# Patient Record
Sex: Male | Born: 1955
Health system: Southern US, Community
[De-identification: ages and names within clinical notes are randomized; demographics above are authoritative.]

## PROBLEM LIST (undated history)

## (undated) DIAGNOSIS — G8929 Other chronic pain: Secondary | ICD-10-CM

## (undated) DIAGNOSIS — N319 Neuromuscular dysfunction of bladder, unspecified: Secondary | ICD-10-CM

## (undated) DIAGNOSIS — G114 Hereditary spastic paraplegia: Secondary | ICD-10-CM

## (undated) DIAGNOSIS — M545 Low back pain, unspecified: Secondary | ICD-10-CM

## (undated) DIAGNOSIS — M1990 Unspecified osteoarthritis, unspecified site: Secondary | ICD-10-CM

## (undated) DIAGNOSIS — F5104 Psychophysiologic insomnia: Secondary | ICD-10-CM

## (undated) DIAGNOSIS — G2581 Restless legs syndrome: Secondary | ICD-10-CM

## (undated) DIAGNOSIS — R269 Unspecified abnormalities of gait and mobility: Secondary | ICD-10-CM

## (undated) DIAGNOSIS — K648 Other hemorrhoids: Secondary | ICD-10-CM

## (undated) DIAGNOSIS — E785 Hyperlipidemia, unspecified: Secondary | ICD-10-CM

## (undated) DIAGNOSIS — G5601 Carpal tunnel syndrome, right upper limb: Secondary | ICD-10-CM

## (undated) DIAGNOSIS — F32A Depression, unspecified: Secondary | ICD-10-CM

## (undated) DIAGNOSIS — F329 Major depressive disorder, single episode, unspecified: Secondary | ICD-10-CM

## (undated) HISTORY — PX: APPENDECTOMY: SHX54

## (undated) HISTORY — DX: Depression, unspecified: F32.A

## (undated) HISTORY — DX: Other chronic pain: G89.29

## (undated) HISTORY — DX: Hereditary spastic paraplegia: G11.4

## (undated) HISTORY — PX: SHOULDER SURGERY: SHX246

## (undated) HISTORY — DX: Carpal tunnel syndrome, right upper limb: G56.01

## (undated) HISTORY — DX: Major depressive disorder, single episode, unspecified: F32.9

## (undated) HISTORY — DX: Restless legs syndrome: G25.81

## (undated) HISTORY — DX: Low back pain, unspecified: M54.50

## (undated) HISTORY — DX: Other hemorrhoids: K64.8

## (undated) HISTORY — DX: Low back pain: M54.5

## (undated) HISTORY — DX: Hyperlipidemia, unspecified: E78.5

## (undated) HISTORY — DX: Unspecified osteoarthritis, unspecified site: M19.90

## (undated) HISTORY — PX: BREAST SURGERY: SHX581

## (undated) HISTORY — DX: Unspecified abnormalities of gait and mobility: R26.9

## (undated) HISTORY — DX: Psychophysiologic insomnia: F51.04

## (undated) HISTORY — PX: KNEE ARTHROSCOPY: SHX127

## (undated) HISTORY — DX: Neuromuscular dysfunction of bladder, unspecified: N31.9

## (undated) HISTORY — PX: COLONOSCOPY: SHX174

## (undated) HISTORY — PX: HEMORRHOID BANDING: SHX5850

---

## 2003-10-17 ENCOUNTER — Ambulatory Visit (HOSPITAL_COMMUNITY): Admission: RE | Admit: 2003-10-17 | Discharge: 2003-10-17 | Payer: Self-pay | Admitting: Family Medicine

## 2008-01-01 ENCOUNTER — Encounter: Admission: RE | Admit: 2008-01-01 | Discharge: 2008-01-27 | Payer: Self-pay | Admitting: Neurology

## 2012-07-09 DIAGNOSIS — M545 Low back pain, unspecified: Secondary | ICD-10-CM | POA: Insufficient documentation

## 2012-07-09 DIAGNOSIS — R269 Unspecified abnormalities of gait and mobility: Secondary | ICD-10-CM | POA: Insufficient documentation

## 2012-07-09 DIAGNOSIS — G8389 Other specified paralytic syndromes: Secondary | ICD-10-CM | POA: Insufficient documentation

## 2012-07-09 DIAGNOSIS — N398 Other specified disorders of urinary system: Secondary | ICD-10-CM | POA: Insufficient documentation

## 2012-07-09 DIAGNOSIS — R209 Unspecified disturbances of skin sensation: Secondary | ICD-10-CM | POA: Insufficient documentation

## 2013-01-06 ENCOUNTER — Encounter: Payer: Self-pay | Admitting: Neurology

## 2013-01-06 DIAGNOSIS — G8389 Other specified paralytic syndromes: Secondary | ICD-10-CM

## 2013-01-06 DIAGNOSIS — R209 Unspecified disturbances of skin sensation: Secondary | ICD-10-CM

## 2013-01-06 DIAGNOSIS — N398 Other specified disorders of urinary system: Secondary | ICD-10-CM

## 2013-01-06 DIAGNOSIS — R269 Unspecified abnormalities of gait and mobility: Secondary | ICD-10-CM

## 2013-01-06 DIAGNOSIS — M545 Low back pain, unspecified: Secondary | ICD-10-CM

## 2013-01-07 ENCOUNTER — Ambulatory Visit: Payer: Self-pay | Admitting: Neurology

## 2013-08-11 DIAGNOSIS — G8929 Other chronic pain: Secondary | ICD-10-CM | POA: Insufficient documentation

## 2013-08-18 DIAGNOSIS — Z79899 Other long term (current) drug therapy: Secondary | ICD-10-CM | POA: Insufficient documentation

## 2013-10-27 ENCOUNTER — Telehealth: Payer: Self-pay | Admitting: Neurology

## 2013-10-27 ENCOUNTER — Encounter: Payer: Self-pay | Admitting: *Deleted

## 2013-10-27 NOTE — Telephone Encounter (Signed)
Pt having some issues wants to be seen

## 2013-10-27 NOTE — Telephone Encounter (Signed)
Called patient and he is having gait problems, scheduled /confirmed appt for f/u.

## 2013-10-28 ENCOUNTER — Ambulatory Visit (INDEPENDENT_AMBULATORY_CARE_PROVIDER_SITE_OTHER): Payer: Commercial Managed Care - PPO | Admitting: Neurology

## 2013-10-28 ENCOUNTER — Encounter: Payer: Self-pay | Admitting: Neurology

## 2013-10-28 ENCOUNTER — Encounter (INDEPENDENT_AMBULATORY_CARE_PROVIDER_SITE_OTHER): Payer: Self-pay

## 2013-10-28 VITALS — BP 132/86 | HR 91 | Ht 68.0 in | Wt 162.0 lb

## 2013-10-28 DIAGNOSIS — M545 Low back pain, unspecified: Secondary | ICD-10-CM

## 2013-10-28 DIAGNOSIS — R269 Unspecified abnormalities of gait and mobility: Secondary | ICD-10-CM

## 2013-10-28 DIAGNOSIS — G114 Hereditary spastic paraplegia: Secondary | ICD-10-CM | POA: Insufficient documentation

## 2013-10-28 MED ORDER — BACLOFEN 10 MG PO TABS: ORAL_TABLET | ORAL | Status: DC

## 2013-10-28 NOTE — Progress Notes (Signed)
Reason for visit: Gait disorder  Terry PandySteve Howard is an 58 y.o. male  History of present illness:  Terry Howard is a 58 year old right-handed white male with a history of a hereditary spastic paraparesis. The patient's mother and 3 brothers had the same issue. The patient has developed increasing spasticity of both legs, left greater than right. The patient does have a neurogenic bladder, but he indicates that this has not changed over time. The patient denies any falls, and he walks with a quad cane. The patient has increasing problems with his restless leg syndrome indicating that when he tries to sleep at night or rest during the day, his legs will flex suddenly. This may affect either the right or left leg. The patient is taking Requip at this point, 3 mg at night without much benefit. The patient went off of his lorazepam, but he is not clear that his restless leg syndrome worsened because of this. The patient returns to office today for an evaluation. The patient is catching his toes with walking, and he has an AFO brace, but does not wear this as he feels it does not help.  Past Medical History  Diagnosis Date  . Hereditary spastic paraparesis   . RLS (restless legs syndrome)   . Chronic low back pain     Annular tear at the L2-3 and L4-5 levels 4  . Neurogenic bladder   . 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 Chronic insomnia   . Gait disorder     Past Surgical History  Procedure Laterality Date  . Shoulder surgery      Rotator cuff, left  . Breast surgery Bilateral     Family History  Problem Relation Age of Onset  . Cancer Mother     breast cancer  . Stroke Father     Intracranial hemorrhage    Social history:  reports that he has never smoked. He has never used smokeless tobacco. He reports that he drinks alcohol. He reports that he does not use illicit drugs.   No Known Allergies  Medications:  Current Outpatient Prescriptions on File Prior to Visit    Medication Sig Dispense Refill  . tizanidine (ZANAFLEX) 2 MG capsule Take 2 mg by mouth 3 (three) times daily.      . traZODone (DESYREL) 150 MG tablet Take 150 mg by mouth at bedtime.       No current facility-administered medications on file prior to visit.    ROS:  Out of a complete 14 system review of symptoms, the patient complains only of the following symptoms, and all other reviewed systems are negative.  Abdominal pain Restless legs, insomnia Low back pain Walking difficulties  Blood pressure 132/86, pulse 91, height 5\' 8"  (1.727 m), weight 162 lb (73.483 kg).  Physical Exam  General: The patient is alert and cooperative at the time of the examination.  Skin: No significant peripheral edema is noted.   Neurologic Exam  Mental status: The patient is oriented x 3.  Cranial nerves: Facial symmetry is present. Speech is normal, no aphasia or dysarthria is noted. Extraocular movements are full. Visual fields are full.  Motor: The patient has good strength in all 4 extremities.  Sensory examination: Soft sensation is symmetric on the face, arms, or legs.  Coordination: The patient has good finger-nose-finger and heel-to-shin bilaterally.  Gait and station: The patient has a diplegia gait. Tandem gait was not attempted. The patient walks with a quad cane.  Romberg is negative. No drift is seen.  Reflexes: Deep tendon reflexes are symmetric, but are brisk in the legs.   Assessment/Plan:  1. Hereditary spastic paraparesis  2. Gait disorder  3. Neurogenic bladder  4. Restless leg syndrome  5. Chronic insomnia  The patient is having increasing problems with sleeping at night and resting during the day with the restless leg syndrome or possibly spasticity of the legs. The patient will be given a trial on baclofen taking 5 mg twice a day, 10 mg at night. In the future, the patient may be considered for a trial on Belsomra for sleep, as he has chronic insomnia. The  patient will followup in one year, but the patient will call me if medication adjustments need to be made.  Terry Palau MD 10/28/2013 7:48 PM  [facility] [address] [place], [place] [address]  Phone [phone] Fax [phone]

## 2013-10-28 NOTE — Patient Instructions (Signed)

## 2013-10-29 DIAGNOSIS — R935 Abnormal findings on diagnostic imaging of other abdominal regions, including retroperitoneum: Secondary | ICD-10-CM | POA: Insufficient documentation

## 2013-11-18 DIAGNOSIS — G4700 Insomnia, unspecified: Secondary | ICD-10-CM | POA: Insufficient documentation

## 2013-11-18 DIAGNOSIS — B009 Herpesviral infection, unspecified: Secondary | ICD-10-CM | POA: Insufficient documentation

## 2013-11-18 DIAGNOSIS — M79609 Pain in unspecified limb: Secondary | ICD-10-CM | POA: Insufficient documentation

## 2013-12-18 DIAGNOSIS — E785 Hyperlipidemia, unspecified: Secondary | ICD-10-CM | POA: Insufficient documentation

## 2013-12-31 DIAGNOSIS — F329 Major depressive disorder, single episode, unspecified: Secondary | ICD-10-CM | POA: Insufficient documentation

## 2013-12-31 DIAGNOSIS — F32A Depression, unspecified: Secondary | ICD-10-CM | POA: Insufficient documentation

## 2014-01-05 DIAGNOSIS — F419 Anxiety disorder, unspecified: Secondary | ICD-10-CM | POA: Insufficient documentation

## 2014-06-21 ENCOUNTER — Encounter: Payer: Self-pay | Admitting: Neurology

## 2014-09-08 ENCOUNTER — Other Ambulatory Visit (HOSPITAL_BASED_OUTPATIENT_CLINIC_OR_DEPARTMENT_OTHER): Payer: Self-pay | Admitting: Orthopaedic Surgery

## 2014-09-09 ENCOUNTER — Encounter (HOSPITAL_BASED_OUTPATIENT_CLINIC_OR_DEPARTMENT_OTHER): Payer: Self-pay | Admitting: *Deleted

## 2014-09-10 ENCOUNTER — Ambulatory Visit (HOSPITAL_BASED_OUTPATIENT_CLINIC_OR_DEPARTMENT_OTHER)
Admission: RE | Admit: 2014-09-10 | Discharge: 2014-09-10 | Disposition: A | Discharge status: Home / Self Care | Payer: 59 | Source: Ambulatory Visit | Attending: Orthopaedic Surgery | Admitting: Orthopaedic Surgery

## 2014-09-10 ENCOUNTER — Ambulatory Visit (HOSPITAL_BASED_OUTPATIENT_CLINIC_OR_DEPARTMENT_OTHER): Payer: 59 | Admitting: Anesthesiology

## 2014-09-10 ENCOUNTER — Encounter (HOSPITAL_BASED_OUTPATIENT_CLINIC_OR_DEPARTMENT_OTHER): Payer: Self-pay | Admitting: Certified Registered"

## 2014-09-10 ENCOUNTER — Encounter (HOSPITAL_BASED_OUTPATIENT_CLINIC_OR_DEPARTMENT_OTHER)
Admission: RE | Disposition: A | Discharge status: Home / Self Care | Payer: Self-pay | Source: Ambulatory Visit | Attending: Orthopaedic Surgery

## 2014-09-10 DIAGNOSIS — G2581 Restless legs syndrome: Secondary | ICD-10-CM | POA: Insufficient documentation

## 2014-09-10 DIAGNOSIS — G5622 Lesion of ulnar nerve, left upper limb: Secondary | ICD-10-CM | POA: Insufficient documentation

## 2014-09-10 DIAGNOSIS — M549 Dorsalgia, unspecified: Secondary | ICD-10-CM | POA: Diagnosis not present

## 2014-09-10 DIAGNOSIS — F5104 Psychophysiologic insomnia: Secondary | ICD-10-CM | POA: Diagnosis not present

## 2014-09-10 DIAGNOSIS — G8929 Other chronic pain: Secondary | ICD-10-CM | POA: Diagnosis not present

## 2014-09-10 DIAGNOSIS — Z79899 Other long term (current) drug therapy: Secondary | ICD-10-CM | POA: Insufficient documentation

## 2014-09-10 HISTORY — PX: ULNAR TUNNEL RELEASE: SHX820

## 2014-09-10 LAB — POCT HEMOGLOBIN-HEMACUE: Hemoglobin: 15.9 g/dL (ref 13.0–17.0)

## 2014-09-10 SURGERY — RELEASE, CUBITAL TUNNEL
Anesthesia: General | Site: Arm Upper | Laterality: Left

## 2014-09-10 MED ORDER — HYDROCODONE-ACETAMINOPHEN 5-325 MG PO TABS: 1.0000 | ORAL_TABLET | Freq: Four times a day (QID) | ORAL | Status: DC | PRN

## 2014-09-10 MED ORDER — CEFAZOLIN SODIUM-DEXTROSE 2-3 GM-% IV SOLR
INTRAVENOUS | Status: AC
  Filled 2014-09-10: qty 50

## 2014-09-10 MED ORDER — PROMETHAZINE HCL 25 MG/ML IJ SOLN: 6.2500 mg | INTRAMUSCULAR | Status: DC | PRN

## 2014-09-10 MED ORDER — LIDOCAINE HCL (CARDIAC) 20 MG/ML IV SOLN
INTRAVENOUS | Status: DC | PRN
  Administered 2014-09-10: 60 mg via INTRAVENOUS

## 2014-09-10 MED ORDER — ONDANSETRON HCL 4 MG/2ML IJ SOLN
INTRAMUSCULAR | Status: DC | PRN
  Administered 2014-09-10: 4 mg via INTRAVENOUS

## 2014-09-10 MED ORDER — MIDAZOLAM HCL 5 MG/5ML IJ SOLN
INTRAMUSCULAR | Status: DC | PRN
  Administered 2014-09-10: 1 mg via INTRAVENOUS

## 2014-09-10 MED ORDER — FENTANYL CITRATE 0.05 MG/ML IJ SOLN: 25.0000 ug | INTRAMUSCULAR | Status: DC | PRN

## 2014-09-10 MED ORDER — MIDAZOLAM HCL 2 MG/2ML IJ SOLN: 1.0000 mg | INTRAMUSCULAR | Status: DC | PRN

## 2014-09-10 MED ORDER — FENTANYL CITRATE 0.05 MG/ML IJ SOLN
INTRAMUSCULAR | Status: DC | PRN
  Administered 2014-09-10 (×2): 50 ug via INTRAVENOUS

## 2014-09-10 MED ORDER — PROPOFOL 10 MG/ML IV BOLUS
INTRAVENOUS | Status: DC | PRN
  Administered 2014-09-10: 200 mg via INTRAVENOUS

## 2014-09-10 MED ORDER — FENTANYL CITRATE 0.05 MG/ML IJ SOLN
INTRAMUSCULAR | Status: AC
  Filled 2014-09-10: qty 6

## 2014-09-10 MED ORDER — FENTANYL CITRATE 0.05 MG/ML IJ SOLN: 50.0000 ug | INTRAMUSCULAR | Status: DC | PRN

## 2014-09-10 MED ORDER — MIDAZOLAM HCL 2 MG/2ML IJ SOLN
INTRAMUSCULAR | Status: AC
  Filled 2014-09-10: qty 2

## 2014-09-10 MED ORDER — DEXAMETHASONE SODIUM PHOSPHATE 10 MG/ML IJ SOLN
INTRAMUSCULAR | Status: DC | PRN
  Administered 2014-09-10: 10 mg via INTRAVENOUS

## 2014-09-10 MED ORDER — LACTATED RINGERS IV SOLN
INTRAVENOUS | Status: DC
  Administered 2014-09-10 (×2): via INTRAVENOUS

## 2014-09-10 MED ORDER — KETOROLAC TROMETHAMINE 30 MG/ML IJ SOLN: 30.0000 mg | Freq: Once | INTRAMUSCULAR | Status: DC | PRN

## 2014-09-10 MED ORDER — BUPIVACAINE HCL (PF) 0.25 % IJ SOLN
INTRAMUSCULAR | Status: DC | PRN
  Administered 2014-09-10: 7 mL

## 2014-09-10 MED ORDER — EPHEDRINE SULFATE 50 MG/ML IJ SOLN
INTRAMUSCULAR | Status: DC | PRN
  Administered 2014-09-10: 10 mg via INTRAVENOUS

## 2014-09-10 MED ORDER — CEFAZOLIN SODIUM-DEXTROSE 2-3 GM-% IV SOLR
2.0000 g | INTRAVENOUS | Status: AC
  Administered 2014-09-10: 2 g via INTRAVENOUS

## 2014-09-10 SURGICAL SUPPLY — 61 items
BANDAGE ELASTIC 3 VELCRO ST LF (GAUZE/BANDAGES/DRESSINGS) ×4 IMPLANT
BLADE CLIPPER SURG (BLADE) ×2 IMPLANT
BLADE SURG 15 STRL LF DISP TIS (BLADE) ×1 IMPLANT
BLADE SURG 15 STRL SS (BLADE) ×1
BNDG ESMARK 4X9 LF (GAUZE/BANDAGES/DRESSINGS) ×2 IMPLANT
BRUSH SCRUB EZ PLAIN DRY (MISCELLANEOUS) ×2 IMPLANT
CORDS BIPOLAR (ELECTRODE) ×2 IMPLANT
COVER BACK TABLE 60X90IN (DRAPES) ×2 IMPLANT
COVER MAYO STAND STRL (DRAPES) ×2 IMPLANT
CUFF TOURN SGL LL 18 NRW (TOURNIQUET CUFF) ×2 IMPLANT
CUFF TOURNIQUET SINGLE 18IN (TOURNIQUET CUFF) IMPLANT
DECANTER SPIKE VIAL GLASS SM (MISCELLANEOUS) IMPLANT
DRAPE EXTREMITY T 121X128X90 (DRAPE) ×2 IMPLANT
DRAPE SURG 17X23 STRL (DRAPES) ×2 IMPLANT
GAUZE SPONGE 4X4 12PLY STRL (GAUZE/BANDAGES/DRESSINGS) ×2 IMPLANT
GAUZE XEROFORM 1X8 LF (GAUZE/BANDAGES/DRESSINGS) ×2 IMPLANT
GLOVE BIO SURGEON STRL SZ 6.5 (GLOVE) ×2 IMPLANT
GLOVE BIOGEL PI IND STRL 6.5 (GLOVE) ×1 IMPLANT
GLOVE BIOGEL PI IND STRL 7.0 (GLOVE) ×1 IMPLANT
GLOVE BIOGEL PI INDICATOR 6.5 (GLOVE) ×1
GLOVE BIOGEL PI INDICATOR 7.0 (GLOVE) ×1
GLOVE ECLIPSE 6.5 STRL STRAW (GLOVE) ×2 IMPLANT
GLOVE EXAM NITRILE EXT CUFF MD (GLOVE) ×2 IMPLANT
GLOVE NEODERM STRL 7.5 LF PF (GLOVE) ×1 IMPLANT
GLOVE SURG NEODERM 7.5  LF PF (GLOVE) ×1
GLOVE SURG SYN 7.5  E (GLOVE) ×1
GLOVE SURG SYN 7.5 E (GLOVE) ×1 IMPLANT
GOWN STRL REIN XL XLG (GOWN DISPOSABLE) IMPLANT
GOWN STRL REUS W/ TWL LRG LVL3 (GOWN DISPOSABLE) ×3 IMPLANT
GOWN STRL REUS W/TWL LRG LVL3 (GOWN DISPOSABLE) ×3
LOOP VESSEL MAXI BLUE (MISCELLANEOUS) ×2 IMPLANT
NEEDLE HYPO 25X1 1.5 SAFETY (NEEDLE) ×2 IMPLANT
NS IRRIG 1000ML POUR BTL (IV SOLUTION) ×2 IMPLANT
PACK BASIN DAY SURGERY FS (CUSTOM PROCEDURE TRAY) ×2 IMPLANT
PAD CAST 3X4 CTTN HI CHSV (CAST SUPPLIES) ×1 IMPLANT
PADDING CAST ABS 4INX4YD NS (CAST SUPPLIES)
PADDING CAST ABS COTTON 4X4 ST (CAST SUPPLIES) IMPLANT
PADDING CAST COTTON 3X4 STRL (CAST SUPPLIES) ×1
PADDING CAST SYNTHETIC 3 NS LF (CAST SUPPLIES)
PADDING CAST SYNTHETIC 3X4 NS (CAST SUPPLIES) IMPLANT
PADDING CAST SYNTHETIC 4 (CAST SUPPLIES)
PADDING CAST SYNTHETIC 4X4 STR (CAST SUPPLIES) IMPLANT
SLEEVE SCD COMPRESS KNEE MED (MISCELLANEOUS) IMPLANT
SLING ARM FOAM STRAP LRG (SOFTGOODS) IMPLANT
SLING ARM MED ADULT FOAM STRAP (SOFTGOODS) ×2 IMPLANT
SPLINT PLASTER CAST XFAST 3X15 (CAST SUPPLIES) IMPLANT
SPLINT PLASTER XTRA FASTSET 3X (CAST SUPPLIES)
STOCKINETTE 4X48 STRL (DRAPES) ×2 IMPLANT
STRIP CLOSURE SKIN 1/4X4 (GAUZE/BANDAGES/DRESSINGS) IMPLANT
SUT ETHILON 4 0 PS 2 18 (SUTURE) ×2 IMPLANT
SUT VIC AB 0 CT3 27 (SUTURE) ×2 IMPLANT
SUT VIC AB 2-0 CT1 27 (SUTURE) ×1
SUT VIC AB 2-0 CT1 TAPERPNT 27 (SUTURE) ×1 IMPLANT
SUT VIC AB 4-0 P-3 18XBRD (SUTURE) IMPLANT
SUT VIC AB 4-0 P2 18 (SUTURE) IMPLANT
SUT VIC AB 4-0 P3 18 (SUTURE)
SYR BULB 3OZ (MISCELLANEOUS) ×2 IMPLANT
SYR CONTROL 10ML LL (SYRINGE) ×2 IMPLANT
TOWEL OR 17X24 6PK STRL BLUE (TOWEL DISPOSABLE) ×2 IMPLANT
TRAY DSU PREP LF (CUSTOM PROCEDURE TRAY) ×2 IMPLANT
UNDERPAD 30X30 INCONTINENT (UNDERPADS AND DIAPERS) ×2 IMPLANT

## 2014-09-10 NOTE — Discharge Instructions (Signed)
Postoperative instructions: ° °Weightbearing: as tolerated ° °Keep your dressing and/or splint clean and dry at all times.  You can remove your dressing on post-operative day #3 and change with a dry/sterile dressing or Band-Aids as needed thereafter.   ° °Incision instructions:  Do not soak your incision for 3 weeks after surgery.  If the incision gets wet, pat dry and do not scrub the incision. ° °Pain control:  You have been given a prescription to be taken as directed for post-operative pain control.  In addition, elevate the operative extremity above the heart at all times to prevent swelling and throbbing pain. ° °Take over-the-counter Colace, 100mg by mouth twice a day while taking narcotic pain medications to help prevent constipation. ° °Follow up appointments: °1) 10-14 days for suture removal and wound check. °2) Dr. Xu as scheduled. ° ° ------------------------------------------------------------------------------------------------------------- ° °After Surgery Pain Control: ° °After your surgery, post-surgical discomfort or pain is likely. This discomfort can last several days to a few weeks. At certain times of the day your discomfort may be more intense.  °Did you receive a nerve block?  °A nerve block can provide pain relief for one hour to two days after your surgery. As long as the nerve block is working, you will experience little or no sensation in the area the surgeon operated on.  °As the nerve block wears off, you will begin to experience pain or discomfort. It is very important that you begin taking your prescribed pain medication before the nerve block fully wears off. Treating your pain at the first sign of the block wearing off will ensure your pain is better controlled and more tolerable when full-sensation returns. Do not wait until the pain is intolerable, as the medicine will be less effective. It is better to treat pain in advance than to try and catch up.  °General Anesthesia:  °If  you did not receive a nerve block during your surgery, you will need to start taking your pain medication shortly after your surgery and should continue to do so as prescribed by your surgeon.  °Pain Medication:  °Most commonly we prescribe Vicodin and Percocet for post-operative pain. Both of these medications contain a combination of acetaminophen (Tylenol®) and a narcotic to help control pain.  °· It takes between 30 and 45 minutes before pain medication starts to work. It is important to take your medication before your pain level gets too intense.  °· Nausea is a common side effect of many pain medications. You will want to eat something before taking your pain medicine to help prevent nausea.  °· If you are taking a prescription pain medication that contains acetaminophen, we recommend that you do not take additional over the counter acetaminophen (Tylenol®).  °Other pain relieving options:  °· Using a cold pack to ice the affected area a few times a day (15 to 20 minutes at a time) can help to relieve pain, reduce swelling and bruising.  °· Elevation of the affected area can also help to reduce pain and swelling. ° ° ° ° °Post Anesthesia Home Care Instructions ° °Activity: °Get plenty of rest for the remainder of the day. A responsible adult should stay with you for 24 hours following the procedure.  °For the next 24 hours, DO NOT: °-Drive a car °-Operate machinery °-Drink alcoholic beverages °-Take any medication unless instructed by your physician °-Make any legal decisions or sign important papers. ° °Meals: °Start with liquid foods such as gelatin or soup.   Progress to regular foods as tolerated. Avoid greasy, spicy, heavy foods. If nausea and/or vomiting occur, drink only clear liquids until the nausea and/or vomiting subsides. Call your physician if vomiting continues.  Special Instructions/Symptoms: Your throat may feel dry or sore from the anesthesia or the breathing tube placed in your throat  during surgery. If this causes discomfort, gargle with warm salt water. The discomfort should disappear within 24 hours.

## 2014-09-10 NOTE — Transfer of Care (Signed)
Immediate Anesthesia Transfer of Care Note  Patient: Terry Howard  Procedure(s) Performed: Procedure(s): LEFT CUBITAL TUNNEL RELEASE (Left)  Patient Location: PACU  Anesthesia Type:General  Level of Consciousness: awake and patient cooperative  Airway & Oxygen Therapy: Patient Spontanous Breathing and Patient connected to face mask oxygen  Post-op Assessment: Report given to RN and Post -op Vital signs reviewed and stable  Post vital signs: Reviewed and stable  Last Vitals:  Filed Vitals:   09/10/14 0902  BP: 111/71  Pulse: 64  Temp: 36.7 C  Resp: 16    Complications: No apparent anesthesia complications

## 2014-09-10 NOTE — Anesthesia Procedure Notes (Signed)
Procedure Name: LMA Insertion Date/Time: 09/10/2014 10:15 AM Performed by: Keian Odriscoll Pre-anesthesia Checklist: Patient identified, Emergency Drugs available, Suction available and Patient being monitored Patient Re-evaluated:Patient Re-evaluated prior to inductionOxygen Delivery Method: Circle System Utilized Preoxygenation: Pre-oxygenation with 100% oxygen Intubation Type: IV induction Ventilation: Mask ventilation without difficulty LMA: LMA inserted LMA Size: 4.0 Number of attempts: 1 Airway Equipment and Method: Bite block Placement Confirmation: positive ETCO2 Tube secured with: Tape Dental Injury: Teeth and Oropharynx as per pre-operative assessment

## 2014-09-10 NOTE — Anesthesia Preprocedure Evaluation (Signed)
Anesthesia Evaluation  Patient identified by MRN, date of birth, ID band Patient awake    Reviewed: Allergy & Precautions, NPO status , Patient's Chart, lab work & pertinent test results  Airway Mallampati: II  TM Distance: >3 FB Neck ROM: Full    Dental no notable dental hx.    Pulmonary neg pulmonary ROS,  breath sounds clear to auscultation  Pulmonary exam normal       Cardiovascular negative cardio ROS  Rhythm:Regular Rate:Normal     Neuro/Psych negative neurological ROS  negative psych ROS   GI/Hepatic negative GI ROS, Neg liver ROS,   Endo/Other  negative endocrine ROS  Renal/GU negative Renal ROS  negative genitourinary   Musculoskeletal negative musculoskeletal ROS (+)   Abdominal   Peds negative pediatric ROS (+)  Hematology negative hematology ROS (+)   Anesthesia Other Findings   Reproductive/Obstetrics negative OB ROS                             Anesthesia Physical Anesthesia Plan  ASA: II  Anesthesia Plan: General   Post-op Pain Management:    Induction: Intravenous  Airway Management Planned: LMA  Additional Equipment:   Intra-op Plan:   Post-operative Plan: Extubation in OR  Informed Consent: I have reviewed the patients History and Physical, chart, labs and discussed the procedure including the risks, benefits and alternatives for the proposed anesthesia with the patient or authorized representative who has indicated his/her understanding and acceptance.   Dental advisory given  Plan Discussed with: CRNA and Surgeon  Anesthesia Plan Comments:         Anesthesia Quick Evaluation

## 2014-09-10 NOTE — Anesthesia Postprocedure Evaluation (Signed)
  Anesthesia Post-op Note  Patient: Terry Howard  Procedure(s) Performed: Procedure(s) (LRB): LEFT CUBITAL TUNNEL RELEASE (Left)  Patient Location: PACU  Anesthesia Type: General  Level of Consciousness: awake and alert   Airway and Oxygen Therapy: Patient Spontanous Breathing  Post-op Pain: mild  Post-op Assessment: Post-op Vital signs reviewed, Patient's Cardiovascular Status Stable, Respiratory Function Stable, Patent Airway and No signs of Nausea or vomiting  Last Vitals:  Filed Vitals:   09/10/14 1155  BP: 122/85  Pulse: 66  Temp:   Resp: 16    Post-op Vital Signs: stable   Complications: No apparent anesthesia complications

## 2014-09-10 NOTE — Op Note (Signed)
   DATE OF SURGERY: 09/10/2014  PREOPERATIVE DIAGNOSIS: No diagnosis found.  POSTOPERATIVE DIAGNOSIS: same.  PROCEDURE: Left  ulnar nerve neurolysis at elbow.  CPT [id number]  SURGEON: Surgeon(s) and Role:    * Tajae Rybicki Glee ArvinMichael Marit Goodwill, MD - Primary  ANESTHESIA: General  TOURNIQUET TIME: Total Tourniquet Time Documented: Upper Arm (Left) - 23 minutes Total: Upper Arm (Left) - 46 minutes    BLOOD LOSS: Minimal.  COMPLICATIONS: None.  PATHOLOGY: None.  TIME OUT: Performed prior to start of procedure.  INDICATIONS: The patient was a 59 y.o. male who presented with cubital tunnel syndrome failing nonsurgical managements, indicated for surgery.  DESCRIPTION OF PROCEDURE: The patient was identified in the preoperative holding area.  The operative site was marked by the surgeon and confirmed by the patient.  He was brought back to the operating room.  Anesthesia was induced by the anesthesia team.  A well padded nonsterile tourniquet was placed. The operative extremity was prepped and draped in standard sterile fashion. A medial elbow incision was made in between the medial epicondyle and olecranon. The medial antebrachial cutaneous nerve was exposed and retracted and protected. The ulnar nerve was identified in between the medial intermuscular septum and medial head of triceps. The fascia crossing the medial head of the triceps and intermuscular septum was released about 10 cm proximally to the elbow. Distally, Osborne's ligament was released from its posterior edge. We followed the ulnar nerve distally. The fascia of the flexor carpi ulnaris was divided and deep aponeurosis was also released. A subcutaneous transposition was performed.  Elbow range of motion revealed no points of compression.   At this point, local infiltration with 0.25% of Sensorcaine was given. The tourniquet was deflated. Hemostasis was achieved. The wound was irrigated and closed using 2-0 vicryl and 4-0 nylon sutures. Sterile  dressing applied. The patient was transferred to the recovery room in stable condition after all counts were correct.  POSTOPERATIVE PLAN: To start nerve gliding exercises, avoid heavy lifting for four weeks.  Mayra ReelN. Michael Delina Kruczek, MD [facility] [phone] 11:15 AM

## 2014-09-10 NOTE — H&P (Signed)
PREOPERATIVE H&P  Chief Complaint: Left cubital tunnel syndrome  HPI: Elder CyphersSteven M Gripp is a 59 y.o. male who presents for surgical treatment of Left cubital tunnel syndrome.  He denies any changes in medical history.  Past Medical History  Diagnosis Date  . Hereditary spastic paraparesis   . RLS (restless legs syndrome)   . Chronic low back pain     Annular tear at the L2-3 and L4-5 levels 4  . Neurogenic bladder   . 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 Chronic insomnia   . Gait disorder    Past Surgical History  Procedure Laterality Date  . Shoulder surgery      Rotator cuff, left  . Breast surgery Bilateral     cysts  . Knee arthroscopy      right  . Appendectomy    . Colonoscopy     History   Social History  . Marital Status: Married    Spouse Name: daisy    Number of Children: 1  . Years of Education: 14   Occupational History  . Unemployed     Disability   Social History Main Topics  . Smoking status: Never Smoker   . Smokeless tobacco: Never Used  . Alcohol Use: Yes     Comment: Consumes alcohol on occasion  . Drug Use: No  . Sexual Activity: None   Other Topics Concern  . None   Social History Narrative   Family History  Problem Relation Age of Onset  . Cancer Mother     breast cancer  . Stroke Father     Intracranial hemorrhage   No Known Allergies Prior to Admission medications   Medication Sig Start Date End Date Taking? Authorizing Provider  rOPINIRole (REQUIP) 3 MG tablet Take 3 mg by mouth at bedtime.    Historical Provider, MD     Positive ROS: All other systems have been reviewed and were otherwise negative with the exception of those mentioned in the HPI and as above.  Physical Exam: General: Alert, no acute distress Cardiovascular: No pedal edema Respiratory: No cyanosis, no use of accessory musculature GI: abdomen soft Skin: No lesions in the area of chief complaint Neurologic: Sensation intact  distally Psychiatric: Patient is competent for consent with normal mood and affect Lymphatic: no lymphedema  MUSCULOSKELETAL: exam stable  Assessment: Left cubital tunnel syndrome  Plan: Plan for Procedure(s): LEFT CUBITAL TUNNEL RELEASE  The risks benefits and alternatives were discussed with the patient including but not limited to the risks of nonoperative treatment, versus surgical intervention including infection, bleeding, nerve injury,  blood clots, cardiopulmonary complications, morbidity, mortality, among others, and they were willing to proceed.   Cheral AlmasXu, Elisa Sorlie Michael, MD   09/10/2014 6:58 AM

## 2014-09-13 ENCOUNTER — Encounter (HOSPITAL_BASED_OUTPATIENT_CLINIC_OR_DEPARTMENT_OTHER): Payer: Self-pay | Admitting: Orthopaedic Surgery

## 2014-10-29 ENCOUNTER — Ambulatory Visit: Payer: Commercial Managed Care - PPO | Admitting: Nurse Practitioner

## 2014-10-29 ENCOUNTER — Ambulatory Visit: Payer: Commercial Managed Care - PPO | Admitting: Neurology

## 2014-10-29 ENCOUNTER — Telehealth: Payer: Self-pay | Admitting: Neurology

## 2014-10-29 NOTE — Telephone Encounter (Signed)
This patient did not show for a revisit appointment today. 

## 2014-11-02 ENCOUNTER — Encounter: Payer: Self-pay | Admitting: Neurology

## 2015-08-17 MED FILL — rOPINIRole HCL 2 MG TABS: 2 | 90 days supply | Qty: 180 | Fill #1

## 2015-10-05 MED FILL — ACYCLOVIR 800 MG TABLET: 800 | 90 days supply | Qty: 90 | Fill #1

## 2015-11-14 MED FILL — rOPINIRole HCL 2 MG TABS: 2 | 90 days supply | Qty: 180 | Fill #0

## 2015-12-29 DIAGNOSIS — E7800 Pure hypercholesterolemia, unspecified: Secondary | ICD-10-CM | POA: Diagnosis not present

## 2015-12-29 DIAGNOSIS — B009 Herpesviral infection, unspecified: Secondary | ICD-10-CM | POA: Diagnosis not present

## 2015-12-29 DIAGNOSIS — G8220 Paraplegia, unspecified: Secondary | ICD-10-CM | POA: Diagnosis not present

## 2016-01-09 MED FILL — ACYCLOVIR 800 MG TABLET: 800 | 90 days supply | Qty: 90 | Fill #0

## 2016-02-10 MED FILL — rOPINIRole HCL 2 MG TABS: 2 | 90 days supply | Qty: 180 | Fill #1

## 2016-02-15 ENCOUNTER — Ambulatory Visit (INDEPENDENT_AMBULATORY_CARE_PROVIDER_SITE_OTHER): Payer: 59 | Admitting: Family Medicine

## 2016-02-15 ENCOUNTER — Encounter: Payer: Self-pay | Admitting: Family Medicine

## 2016-02-15 VITALS — BP 113/78 | HR 87 | Ht 66.75 in | Wt 148.0 lb

## 2016-02-15 DIAGNOSIS — N528 Other male erectile dysfunction: Secondary | ICD-10-CM | POA: Diagnosis not present

## 2016-02-15 DIAGNOSIS — G114 Hereditary spastic paraplegia: Secondary | ICD-10-CM | POA: Diagnosis not present

## 2016-02-15 DIAGNOSIS — G2581 Restless legs syndrome: Secondary | ICD-10-CM | POA: Diagnosis not present

## 2016-02-15 DIAGNOSIS — E785 Hyperlipidemia, unspecified: Secondary | ICD-10-CM | POA: Diagnosis not present

## 2016-02-15 DIAGNOSIS — Z719 Counseling, unspecified: Secondary | ICD-10-CM

## 2016-02-15 DIAGNOSIS — N529 Male erectile dysfunction, unspecified: Secondary | ICD-10-CM | POA: Insufficient documentation

## 2016-02-15 NOTE — Progress Notes (Signed)
Terry Howard, D.O. Primary care at [facility]   Subjective:    Chief Complaint  Patient presents with  . Establish Care   New pt, here to establish care.   HPI: Terry Howard is a pleasant 60 y.o. male who presents to [facility] at [facility] today   Old PCP [facility] Dr Terry Howard.  Wife is RN "in the Neuro dept where folks are txed for CVA/ rehab."    Patient with a history of hereditary spastic paraplegia. His brother and mother both suffer from this. He has terrible restless leg syndrome type symptoms and has been working with Dr. Anne Howard for some time now. This condition has disabled patient and he has not worked in some time.    Patient lives at home with his wife and 4 year old son.  He has never smoked and drinks approximately 7 drinks per week.  He has a history of hyperlipidemia and tries to control that naturally through over-the-counter supplements/ herbs.   He does work out however - tries to [employer] of his upper body as much as possible.  Patient's primary concern today is in his erectile dysfunction. He has a long history of using online supplements and also has been doing penile injections at some male clinic in [place]\term. Patient was discouraged because this last time he had side effect of priapism and it was very painful for him. He wants to know if I will prescribe medicines for him that he can inject into his penis.   Neuro- Dr Terry Howard. OrthoDewaine Howard for back    Past Medical History  Diagnosis Date  . Hereditary spastic paraparesis (HCC)   . RLS (restless legs syndrome)   . Chronic low back pain     Annular tear at the L2-3 and L4-5 levels 4  . Neurogenic bladder   . 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 Chronic insomnia   . Gait disorder   . Hyperlipidemia       Past Surgical History  Procedure Laterality Date  . Shoulder surgery      Rotator cuff, left  . Breast surgery Bilateral       cysts  . Knee arthroscopy      right  . Appendectomy    . Colonoscopy    . Ulnar tunnel release Left 09/10/2014    Procedure: LEFT CUBITAL TUNNEL RELEASE;  Surgeon: Terry Almas, MD;  Location: [facility];  Service: Orthopedics;  Laterality: Left;      Family History  Problem Relation Age of Onset  . Cancer Mother     breast cancer  . Other Mother   . Hyperlipidemia Mother   . Stroke Father     Intracranial hemorrhage  . Other Brother   . Other Brother   . Other Brother       History  Drug Use No  ,    History  Alcohol Use  . 4.2 oz/week  . 7 Cans of beer per week    Comment: Consumes alcohol on occasion  ,    History  Smoking status  . Never Smoker   Smokeless tobacco  . Never Used  ,     History  Sexual Activity  . Sexual Activity: Yes  . Birth Control/ Protection: Other-see comments    Comment: vasectomy      Patient's Medications  New Prescriptions   No medications on file  Previous  Medications   ACYCLOVIR (ZOVIRAX) 800 MG TABLET    Take 1 tablet by mouth daily.   CINNAMON 500 MG CAPSULE    Take 1 capsule by mouth daily.   GARLIC 10 MG CAPS    Take 1 capsule by mouth daily.   OMEGA-3 FATTY ACIDS (FISH OIL) 1000 MG CAPS    Take 1 capsule by mouth daily.   TURMERIC CURCUMIN 500 MG CAPS    Take 1 capsule by mouth daily.  Modified Medications   No medications on file  Discontinued Medications   HYDROCODONE-ACETAMINOPHEN (NORCO) 5-325 MG PER TABLET    Take 1-2 tablets by mouth every 6 (six) hours as needed.   ROPINIROLE (REQUIP) 3 MG TABLET    Take 3 mg by mouth at bedtime.     Pregabalin and Statins   Review of Systems:   ( Completed via Adult Medical History Intake form today ) General:   Denies fever, chills, appetite changes, unexplained weight loss.  Optho/Auditory:   Denies visual changes, blurred vision/LOV, ringing in ears/ diff hearing Respiratory:   Denies SOB, DOE, cough, wheezing.  Cardiovascular:    Denies chest pain, palpitations, new onset peripheral edema  Gastrointestinal:   Denies nausea, vomiting, diarrhea.  Genitourinary:    Denies dysuria, increased frequency, flank pain.  Endocrine:     Denies hot or cold intolerance, polyuria, polydipsia. Musculoskeletal:  Denies unexplained myalgias, joint swelling, arthralgias, gait problems.  Skin:  Denies rash, suspicious lesions or new/ changes in moles Neurological:    Denies dizziness, syncope, unexplained weakness, lightheadedness, numbness  Psychiatric/Behavioral:   Denies mood changes, suicidal or homicidal ideations, hallucinations    Objective:   Blood pressure 113/78, pulse 87, height 5' 6.75" (1.695 m), weight 148 lb (67.132 kg). Body mass index is 23.37 kg/(m^2).  General: Well Developed, well nourished, and in no acute distress.  Neuro: Alert and oriented x3, extra-ocular muscles intact, sensation grossly intact.  HEENT: Normocephalic, atraumatic, pupils equal round reactive to light, neck supple, no gross masses, no carotid bruits, no JVD apprec Skin: no gross suspicious lesions or rashes  Cardiac: Regular rate and rhythm, no murmurs rubs or gallops.  Respiratory: Essentially clear to auscultation bilaterally. Not using accessory muscles, speaking in full sentences.  Abdominal: Soft, not grossly distended Musculoskeletal: Ambulates, moves * 4 ext.  Vasc: less 2 sec cap RF, warm and pink  Psych:  No HI/SI, judgement and insight good.    Impression and Recommendations:    1. Other male erectile dysfunction   2. Health education/counseling   3. HLD (hyperlipidemia)   4. Hereditary spastic paraparesis (HCC)   5. Restless legs syndrome (RLS)    --> Patient told me he recently had lab work at his primary care physician's office end of May and he will get me those results. He understands we will need current labs on file before refills will be given.  - referral to urology  -f/up 4-6 mo CPE/ routine health maintenance  screenings  The patient was counseled, risk factors were discussed, anticipatory guidance given.  Gross side effects, risk and benefits, and alternatives of medications discussed with patient.  Patient is aware that all medications have potential side effects and we are unable to predict every side effect or drug-drug interaction that may occur.  Expresses verbal understanding and consents to current therapy plan and treatment regimen.  Please see AVS handed out to patient at the end of our visit for further patient instructions/ counseling done pertaining to today's office visit.  Note: This document was prepared using Dragon voice recognition software and may include unintentional dictation errors.

## 2016-02-15 NOTE — Patient Instructions (Addendum)
Restless Legs Syndrome Restless legs syndrome is a condition that causes uncomfortable feelings or sensations in the legs, especially while sitting or lying down. The sensations usually cause an overwhelming urge to move the legs. The arms can also sometimes be affected. The condition can range from mild to severe. The symptoms often interfere with a person's ability to sleep. CAUSES The cause of this condition is not known. RISK FACTORS This condition is more likely to develop in:  People who are older than age 50.  Pregnant women. In general, restless legs syndrome is more common in women than in men.  People who have a family history of the condition.  People who have certain medical conditions, such as iron deficiency, kidney disease, Parkinson disease, or nerve damage.  People who take certain medicines, such as medicines for high blood pressure, nausea, colds, allergies, depression, and some heart conditions. SYMPTOMS The main symptom of this condition is uncomfortable sensations in the legs. These sensations may be:  Described as pulling, tingling, prickling, throbbing, crawling, or burning.  Worse while you are sitting or lying down.  Worse during periods of rest or inactivity.  Worse at night, often interfering with your sleep.  Accompanied by a very strong urge to move your legs.  Temporarily relieved by movement of your legs. The sensations usually affect both sides of the body. The arms can also be affected, but this is rare. People who have this condition often have tiredness during the day because of their lack of sleep at night. DIAGNOSIS This condition may be diagnosed based on your description of the symptoms. You may also have tests, including blood tests, to check for other conditions that may lead to your symptoms. In some cases, you may be asked to spend some time in a sleep lab so your sleeping can be monitored. TREATMENT Treatment for this condition is  focused on managing the symptoms. Treatment may include:  Self-help and lifestyle changes.  Medicines. HOME CARE INSTRUCTIONS  Take medicines only as directed by your health care provider.  Try these methods to get temporary relief from the uncomfortable sensations:  Massage your legs.  Walk or stretch.  Take a cold or hot bath.  Practice good sleep habits. For example, go to bed and get up at the same time every day.  Exercise regularly.  Practice ways of relaxing, such as yoga or meditation.  Avoid caffeine and alcohol.  Do not use any tobacco products, including cigarettes, chewing tobacco, or electronic cigarettes. If you need help quitting, ask your health care provider.  Keep all follow-up visits as directed by your health care provider. This is important. SEEK MEDICAL CARE IF: Your symptoms do not improve with treatment, or they get worse.   This information is not intended to replace advice given to you by your health care provider. Make sure you discuss any questions you have with your health care provider.   Document Released: 07/13/2002 Document Revised: 12/07/2014 Document Reviewed: 07/19/2014 [employer] Interactive Patient Education 2016 [employer]  

## 2016-02-25 DIAGNOSIS — Z719 Counseling, unspecified: Secondary | ICD-10-CM | POA: Insufficient documentation

## 2016-03-01 ENCOUNTER — Ambulatory Visit: Payer: 59 | Admitting: Family Medicine

## 2016-04-23 MED FILL — ACYCLOVIR 800 MG TABLET: 800 | 90 days supply | Qty: 90 | Fill #1

## 2016-05-08 MED FILL — rOPINIRole HCL 2 MG TABS: 2 | 90 days supply | Qty: 180 | Fill #2

## 2016-05-16 ENCOUNTER — Ambulatory Visit (INDEPENDENT_AMBULATORY_CARE_PROVIDER_SITE_OTHER): Payer: 59 | Admitting: Family Medicine

## 2016-05-16 ENCOUNTER — Encounter: Payer: Self-pay | Admitting: Family Medicine

## 2016-05-16 VITALS — BP 103/66 | HR 68 | Ht 66.75 in | Wt 151.1 lb

## 2016-05-16 DIAGNOSIS — Z0000 Encounter for general adult medical examination without abnormal findings: Secondary | ICD-10-CM | POA: Diagnosis not present

## 2016-05-16 DIAGNOSIS — N400 Enlarged prostate without lower urinary tract symptoms: Secondary | ICD-10-CM

## 2016-05-16 DIAGNOSIS — K644 Residual hemorrhoidal skin tags: Secondary | ICD-10-CM

## 2016-05-16 DIAGNOSIS — Z7189 Other specified counseling: Secondary | ICD-10-CM

## 2016-05-16 DIAGNOSIS — Z1211 Encounter for screening for malignant neoplasm of colon: Secondary | ICD-10-CM

## 2016-05-16 DIAGNOSIS — Z719 Counseling, unspecified: Secondary | ICD-10-CM

## 2016-05-16 LAB — POC HEMOCCULT BLD/STL (OFFICE/1-CARD/DIAGNOSTIC): Fecal Occult Blood, POC: NEGATIVE

## 2016-05-16 NOTE — Patient Instructions (Addendum)
We will recheck your left lobe of yourprostate in approximately 6 months.  Please make an appointment for this in addition to your regular chronic follow-up office visits.  I gave him some information on hemorrhoids. Please let me know if they become more bothersome or if you have any questions or concerns  Hemorrhoids Hemorrhoids are swollen veins around the rectum or anus. There are two types of hemorrhoids:   Internal hemorrhoids. These occur in the veins just inside the rectum. They may poke through to the outside and become irritated and painful.  External hemorrhoids. These occur in the veins outside the anus and can be felt as a painful swelling or hard lump near the anus. CAUSES  Pregnancy.   Obesity.   Constipation or diarrhea.   Straining to have a bowel movement.   Sitting for long periods on the toilet.  Heavy lifting or other activity that caused you to strain.  Anal intercourse. SYMPTOMS   Pain.   Anal itching or irritation.   Rectal bleeding.   Fecal leakage.   Anal swelling.   One or more lumps around the anus.  DIAGNOSIS  Your caregiver may be able to diagnose hemorrhoids by visual examination. Other examinations or tests that may be performed include:   Examination of the rectal area with a gloved hand (digital rectal exam).   Examination of anal canal using a small tube (scope).   A blood test if you have lost a significant amount of blood.  A test to look inside the colon (sigmoidoscopy or colonoscopy). TREATMENT Most hemorrhoids can be treated at home. However, if symptoms do not seem to be getting better or if you have a lot of rectal bleeding, your caregiver may perform a procedure to help make the hemorrhoids get smaller or remove them completely. Possible treatments include:   Placing a rubber band at the base of the hemorrhoid to cut off the circulation (rubber band ligation).   Injecting a chemical to shrink the hemorrhoid  (sclerotherapy).   Using a tool to burn the hemorrhoid (infrared light therapy).   Surgically removing the hemorrhoid (hemorrhoidectomy).   Stapling the hemorrhoid to block blood flow to the tissue (hemorrhoid stapling).  HOME CARE INSTRUCTIONS   Eat foods with fiber, such as whole grains, beans, nuts, fruits, and vegetables. Ask your doctor about taking products with added fiber in them (fibersupplements).  Increase fluid intake. Drink enough water and fluids to keep your urine clear or pale yellow.   Exercise regularly.   Go to the bathroom when you have the urge to have a bowel movement. Do not wait.   Avoid straining to have bowel movements.   Keep the anal area dry and clean. Use wet toilet paper or moist towelettes after a bowel movement.   Medicated creams and suppositories may be used or applied as directed.   Only take over-the-counter or prescription medicines as directed by your caregiver.   Take warm sitz baths for 15-20 minutes, 3-4 times a day to ease pain and discomfort.   Place ice packs on the hemorrhoids if they are tender and swollen. Using ice packs between sitz baths may be helpful.   Put ice in a plastic bag.   Place a towel between your skin and the bag.   Leave the ice on for 15-20 minutes, 3-4 times a day.   Do not use a donut-shaped pillow or sit on the toilet for long periods. This increases blood pooling and pain.  [place]  CARE IF:  You have increasing pain and swelling that is not controlled by treatment or medicine.  You have uncontrolled bleeding.  You have difficulty or you are unable to have a bowel movement.  You have pain or inflammation outside the area of the hemorrhoids. MAKE SURE YOU:  Understand these instructions.  Will watch your condition.  Will get help right away if you are not doing well or get worse.   This information is not intended to replace advice given to you by your health care provider.  Make sure you discuss any questions you have with your health care provider.   Document Released: 07/20/2000 Document Revised: 07/09/2012 Document Reviewed: 05/27/2012 Elsevier Interactive Patient Education 2016 [place] for Adults, Male A healthy lifestyle and preventive care can promote health and wellness. Preventive health guidelines for men include the following key practices:  A routine yearly physical is a good way to check with your health care provider about your health and preventative screening. It is a chance to share any concerns and updates on your health and to receive a thorough exam.  Visit your dentist for a routine exam and preventative care every 6 months. Brush your teeth twice a day and floss once a day. Good oral hygiene prevents tooth decay and gum disease.  The frequency of eye exams is based on your age, health, family medical history, use of contact lenses, and other factors. Follow your health care provider's recommendations for frequency of eye exams.  Eat a healthy diet. Foods such as vegetables, fruits, whole grains, low-fat dairy products, and lean protein foods contain the nutrients you need without too many calories. Decrease your intake of foods high in solid fats, added sugars, and salt. Eat the right amount of calories for you.Get information about a proper diet from your health care provider, if necessary.  Regular physical exercise is one of the most important things you can do for your health. Most adults should get at least 150 minutes of moderate-intensity exercise (any activity that increases your heart rate and causes you to sweat) each week. In addition, most adults need muscle-strengthening exercises on 2 or more days a week.  Maintain a healthy weight. The body mass index (BMI) is a screening tool to identify possible weight problems. It provides an estimate of body fat based on height and weight. Your health care provider  can find your BMI and can help you achieve or maintain a healthy weight.For adults 20 years and older:  A BMI below 18.5 is considered underweight.  A BMI of 18.5 to 24.9 is normal.  A BMI of 25 to 29.9 is considered overweight.  A BMI of 30 and above is considered obese.  Maintain normal blood lipids and cholesterol levels by exercising and minimizing your intake of saturated fat. Eat a balanced diet with plenty of fruit and vegetables. Blood tests for lipids and cholesterol should begin at age 37 and be repeated every 5 years. If your lipid or cholesterol levels are high, you are over 50, or you are at high risk for heart disease, you may need your cholesterol levels checked more frequently.Ongoing high lipid and cholesterol levels should be treated with medicines if diet and exercise are not working.  If you smoke, find out from your health care provider how to quit. If you do not use tobacco, do not start.  Lung cancer screening is recommended for adults aged 49-80 years who are at high  risk for developing lung cancer because of a history of smoking. A yearly low-dose CT scan of the lungs is recommended for people who have at least a 30-pack-year history of smoking and are a current smoker or have quit within the past 15 years. A pack year of smoking is smoking an average of 1 pack of cigarettes a day for 1 year (for example: 1 pack a day for 30 years or 2 packs a day for 15 years). Yearly screening should continue until the smoker has stopped smoking for at least 15 years. Yearly screening should be stopped for people who develop a health problem that would prevent them from having lung cancer treatment.  If you choose to drink alcohol, do not have more than 2 drinks per day. One drink is considered to be 12 ounces (355 mL) of beer, 5 ounces (148 mL) of wine, or 1.5 ounces (44 mL) of liquor.  Avoid use of street drugs. Do not share needles with anyone. Ask for help if you need support or  instructions about stopping the use of drugs.  High blood pressure causes heart disease and increases the risk of stroke. Your blood pressure should be checked at least every 1-2 years. Ongoing high blood pressure should be treated with medicines, if weight loss and exercise are not effective.  If you are 11-79 years old, ask your health care provider if you should take aspirin to prevent heart disease.  Diabetes screening is done by taking a blood sample to check your blood glucose level after you have not eaten for a certain period of time (fasting). If you are not overweight and you do not have risk factors for diabetes, you should be screened once every 3 years starting at age 30. If you are overweight or obese and you are 27-20 years of age, you should be screened for diabetes every year as part of your cardiovascular risk assessment.  Colorectal cancer can be detected and often prevented. Most routine colorectal cancer screening begins at the age of 56 and continues through age 52. However, your health care provider may recommend screening at an earlier age if you have risk factors for colon cancer. On a yearly basis, your health care provider may provide home test kits to check for hidden blood in the stool. Use of a small camera at the end of a tube to directly examine the colon (sigmoidoscopy or colonoscopy) can detect the earliest forms of colorectal cancer. Talk to your health care provider about this at age 78, when routine screening begins. Direct exam of the colon should be repeated every 5-10 years through age 54, unless early forms of precancerous polyps or small growths are found.  People who are at an increased risk for hepatitis B should be screened for this virus. You are considered at high risk for hepatitis B if:  You were born in a country where hepatitis B occurs often. Talk with your health care provider about which countries are considered high risk.  Your parents were born in  a high-risk country and you have not received a shot to protect against hepatitis B (hepatitis B vaccine).  You have HIV or AIDS.  You use needles to inject street drugs.  You live with, or have sex with, someone who has hepatitis B.  You are a man who has sex with other men (MSM).  You get hemodialysis treatment.  You take certain medicines for conditions such as cancer, organ transplantation, and autoimmune conditions.  Hepatitis C blood testing is recommended for all people born from [birth year] through 1965 and any individual with known risks for hepatitis C.  Practice safe sex. Use condoms and avoid high-risk sexual practices to reduce the spread of sexually transmitted infections (STIs). STIs include gonorrhea, chlamydia, syphilis, trichomonas, herpes, HPV, and human immunodeficiency virus (HIV). Herpes, HIV, and HPV are viral illnesses that have no cure. They can result in disability, cancer, and death.  If you are a man who has sex with other men, you should be screened at least once per year for:  HIV.  Urethral, rectal, and pharyngeal infection of gonorrhea, chlamydia, or both.  If you are at risk of being infected with HIV, it is recommended that you take a prescription medicine daily to prevent HIV infection. This is called preexposure prophylaxis (PrEP). You are considered at risk if:  You are a man who has sex with other men (MSM) and have other risk factors.  You are a heterosexual man, are sexually active, and are at increased risk for HIV infection.  You take drugs by injection.  You are sexually active with a partner who has HIV.  Talk with your health care provider about whether you are at high risk of being infected with HIV. If you choose to begin PrEP, you should first be tested for HIV. You should then be tested every 3 months for as long as you are taking PrEP.  A one-time screening for abdominal aortic aneurysm (AAA) and surgical repair of large AAAs by  ultrasound are recommended for men ages 62 to 47 years who are current or former smokers.  Healthy men should no longer receive prostate-specific antigen (PSA) blood tests as part of routine cancer screening. Talk with your health care provider about prostate cancer screening.  Testicular cancer screening is not recommended for adult males who have no symptoms. Screening includes self-exam, a health care provider exam, and other screening tests. Consult with your health care provider about any symptoms you have or any concerns you have about testicular cancer.  Use sunscreen. Apply sunscreen liberally and repeatedly throughout the day. You should seek shade when your shadow is shorter than you. Protect yourself by wearing long sleeves, pants, a wide-brimmed hat, and sunglasses year round, whenever you are outdoors.  Once a month, do a whole-body skin exam, using a mirror to look at the skin on your back. Tell your health care provider about new moles, moles that have irregular borders, moles that are larger than a pencil eraser, or moles that have changed in shape or color.  Stay current with required vaccines (immunizations).  Influenza vaccine. All adults should be immunized every year.  Tetanus, diphtheria, and acellular pertussis (Td, Tdap) vaccine. An adult who has not previously received Tdap or who does not know his vaccine status should receive 1 dose of Tdap. This initial dose should be followed by tetanus and diphtheria toxoids (Td) booster doses every 10 years. Adults with an unknown or incomplete history of completing a 3-dose immunization series with Td-containing vaccines should begin or complete a primary immunization series including a Tdap dose. Adults should receive a Td booster every 10 years.  Varicella vaccine. An adult without evidence of immunity to varicella should receive 2 doses or a second dose if he has previously received 1 dose.  Human papillomavirus (HPV) vaccine.  Males aged 11-21 years who have not received the vaccine previously should receive the 3-dose series. Males aged 22-26 years may be immunized. Immunization  is recommended through the age of 33 years for any male who has sex with males and did not get any or all doses earlier. Immunization is recommended for any person with an immunocompromised condition through the age of 25 years if he did not get any or all doses earlier. During the 3-dose series, the second dose should be obtained 4-8 weeks after the first dose. The third dose should be obtained 24 weeks after the first dose and 16 weeks after the second dose.  Zoster vaccine. One dose is recommended for adults aged 30 years or older unless certain conditions are present.  Measles, mumps, and rubella (MMR) vaccine. Adults born before [birth year] generally are considered immune to measles and mumps. Adults born in [birth year] or later should have 1 or more doses of MMR vaccine unless there is a contraindication to the vaccine or there is laboratory evidence of immunity to each of the three diseases. A routine second dose of MMR vaccine should be obtained at least 28 days after the first dose for students attending postsecondary schools, health care workers, or international travelers. People who received inactivated measles vaccine or an unknown type of measles vaccine during 1963-1967 should receive 2 doses of MMR vaccine. People who received inactivated mumps vaccine or an unknown type of mumps vaccine before 1979 and are at high risk for mumps infection should consider immunization with 2 doses of MMR vaccine. Unvaccinated health care workers born before [birth year] who lack laboratory evidence of measles, mumps, or rubella immunity or laboratory confirmation of disease should consider measles and mumps immunization with 2 doses of MMR vaccine or rubella immunization with 1 dose of MMR vaccine.  Pneumococcal 13-valent conjugate (PCV13) vaccine. When indicated, a person who is  uncertain of his immunization history and has no record of immunization should receive the PCV13 vaccine. All adults 62 years of age and older should receive this vaccine. An adult aged 21 years or older who has certain medical conditions and has not been previously immunized should receive 1 dose of PCV13 vaccine. This PCV13 should be followed with a dose of pneumococcal polysaccharide (PPSV23) vaccine. Adults who are at high risk for pneumococcal disease should obtain the PPSV23 vaccine at least 8 weeks after the dose of PCV13 vaccine. Adults older than 60 years of age who have normal immune system function should obtain the PPSV23 vaccine dose at least 1 year after the dose of PCV13 vaccine.  Pneumococcal polysaccharide (PPSV23) vaccine. When PCV13 is also indicated, PCV13 should be obtained first. All adults aged 46 years and older should be immunized. An adult younger than age 39 years who has certain medical conditions should be immunized. Any person who resides in a nursing home or long-term care facility should be immunized. An adult smoker should be immunized. People with an immunocompromised condition and certain other conditions should receive both PCV13 and PPSV23 vaccines. People with human immunodeficiency virus (HIV) infection should be immunized as soon as possible after diagnosis. Immunization during chemotherapy or radiation therapy should be avoided. Routine use of PPSV23 vaccine is not recommended for American Indians, [place] Natives, or people younger than 65 years unless there are medical conditions that require PPSV23 vaccine. When indicated, people who have unknown immunization and have no record of immunization should receive PPSV23 vaccine. One-time revaccination 5 years after the first dose of PPSV23 is recommended for people aged 19-64 years who have chronic kidney failure, nephrotic syndrome, asplenia, or immunocompromised conditions. People who received 1-2 doses of PPSV23  before age  28 years should receive another dose of PPSV23 vaccine at age 44 years or later if at least 5 years have passed since the previous dose. Doses of PPSV23 are not needed for people immunized with PPSV23 at or after age 67 years.  Meningococcal vaccine. Adults with asplenia or persistent complement component deficiencies should receive 2 doses of quadrivalent meningococcal conjugate (MenACWY-D) vaccine. The doses should be obtained at least 2 months apart. Microbiologists working with certain meningococcal bacteria, [place] recruits, people at risk during an outbreak, and people who travel to or live in countries with a high rate of meningitis should be immunized. A first-year college student up through age 83 years who is living in a residence hall should receive a dose if he did not receive a dose on or after his 16th birthday. Adults who have certain high-risk conditions should receive one or more doses of vaccine.  Hepatitis A vaccine. Adults who wish to be protected from this disease, have chronic liver disease, work with hepatitis A-infected animals, work in hepatitis A research labs, or travel to or work in countries with a high rate of hepatitis A should be immunized. Adults who were previously unvaccinated and who anticipate close contact with an international adoptee during the first 60 days after arrival in the [place] from a country with a high rate of hepatitis A should be immunized.  Hepatitis B vaccine. Adults should be immunized if they wish to be protected from this disease, are under age 19 years and have diabetes, have chronic liver disease, have had more than one sex partner in the past 6 months, may be exposed to blood or other infectious body fluids, are household contacts or sex partners of hepatitis B positive people, are clients or workers in certain care facilities, or travel to or work in countries with a high rate of hepatitis B.  Haemophilus influenzae type b (Hib) vaccine. A  previously unvaccinated person with asplenia or sickle cell disease or having a scheduled splenectomy should receive 1 dose of Hib vaccine. Regardless of previous immunization, a recipient of a hematopoietic stem cell transplant should receive a 3-dose series 6-12 months after his successful transplant. Hib vaccine is not recommended for adults with HIV infection. Preventive Service / Frequency Ages 25 to 6  Blood pressure check.** / Every 3-5 years.  Lipid and cholesterol check.** / Every 5 years beginning at age 3.  Hepatitis C blood test.** / For any individual with known risks for hepatitis C.  Skin self-exam. / Monthly.  Influenza vaccine. / Every year.  Tetanus, diphtheria, and acellular pertussis (Tdap, Td) vaccine.** / Consult your health care provider. 1 dose of Td every 10 years.  Varicella vaccine.** / Consult your health care provider.  HPV vaccine. / 3 doses over 6 months, if 77 or younger.  Measles, mumps, rubella (MMR) vaccine.** / You need at least 1 dose of MMR if you were born in 1957 or later. You may also need a second dose.  Pneumococcal 13-valent conjugate (PCV13) vaccine.** / Consult your health care provider.  Pneumococcal polysaccharide (PPSV23) vaccine.** / 1 to 2 doses if you smoke cigarettes or if you have certain conditions.  Meningococcal vaccine.** / 1 dose if you are age 26 to 14 years and a [employer] researcher living in a residence hall, or have one of several medical conditions. You may also need additional booster doses.  Hepatitis A vaccine.** / Consult your health care provider.  Hepatitis B vaccine.** /  Consult your health care provider.  Haemophilus influenzae type b (Hib) vaccine.** / Consult your health care provider. Ages 29 to 37  Blood pressure check.** / Every year.  Lipid and cholesterol check.** / Every 5 years beginning at age 30.  Lung cancer screening. / Every year if you are aged 67-80 years and have a 30-pack-year  history of smoking and currently smoke or have quit within the past 15 years. Yearly screening is stopped once you have quit smoking for at least 15 years or develop a health problem that would prevent you from having lung cancer treatment.  Fecal occult blood test (FOBT) of stool. / Every year beginning at age 24 and continuing until age 70. You may not have to do this test if you get a colonoscopy every 10 years.  Flexible sigmoidoscopy** or colonoscopy.** / Every 5 years for a flexible sigmoidoscopy or every 10 years for a colonoscopy beginning at age 72 and continuing until age 44.  Hepatitis C blood test.** / For all people born from [birth year] through 1965 and any individual with known risks for hepatitis C.  Skin self-exam. / Monthly.  Influenza vaccine. / Every year.  Tetanus, diphtheria, and acellular pertussis (Tdap/Td) vaccine.** / Consult your health care provider. 1 dose of Td every 10 years.  Varicella vaccine.** / Consult your health care provider.  Zoster vaccine.** / 1 dose for adults aged 22 years or older.  Measles, mumps, rubella (MMR) vaccine.** / You need at least 1 dose of MMR if you were born in 1957 or later. You may also need a second dose.  Pneumococcal 13-valent conjugate (PCV13) vaccine.** / Consult your health care provider.  Pneumococcal polysaccharide (PPSV23) vaccine.** / 1 to 2 doses if you smoke cigarettes or if you have certain conditions.  Meningococcal vaccine.** / Consult your health care provider.  Hepatitis A vaccine.** / Consult your health care provider.  Hepatitis B vaccine.** / Consult your health care provider.  Haemophilus influenzae type b (Hib) vaccine.** / Consult your health care provider. Ages 28 and over  Blood pressure check.** / Every year.  Lipid and cholesterol check.**/ Every 5 years beginning at age 20.  Lung cancer screening. / Every year if you are aged 24-80 years and have a 30-pack-year history of smoking and currently  smoke or have quit within the past 15 years. Yearly screening is stopped once you have quit smoking for at least 15 years or develop a health problem that would prevent you from having lung cancer treatment.  Fecal occult blood test (FOBT) of stool. / Every year beginning at age 59 and continuing until age 46. You may not have to do this test if you get a colonoscopy every 10 years.  Flexible sigmoidoscopy** or colonoscopy.** / Every 5 years for a flexible sigmoidoscopy or every 10 years for a colonoscopy beginning at age 50 and continuing until age 65.  Hepatitis C blood test.** / For all people born from [birth year] through 1965 and any individual with known risks for hepatitis C.  Abdominal aortic aneurysm (AAA) screening.** / A one-time screening for ages 48 to 4 years who are current or former smokers.  Skin self-exam. / Monthly.  Influenza vaccine. / Every year.  Tetanus, diphtheria, and acellular pertussis (Tdap/Td) vaccine.** / 1 dose of Td every 10 years.  Varicella vaccine.** / Consult your health care provider.  Zoster vaccine.** / 1 dose for adults aged 29 years or older.  Pneumococcal 13-valent conjugate (PCV13) vaccine.** / 1 dose for  all adults aged 15 years and older.  Pneumococcal polysaccharide (PPSV23) vaccine.** / 1 dose for all adults aged 72 years and older.  Meningococcal vaccine.** / Consult your health care provider.  Hepatitis A vaccine.** / Consult your health care provider.  Hepatitis B vaccine.** / Consult your health care provider.  Haemophilus influenzae type b (Hib) vaccine.** / Consult your health care provider. **Family history and personal history of risk and conditions may change your health care provider's recommendations.   This information is not intended to replace advice given to you by your health care provider. Make sure you discuss any questions you have with your health care provider.   Document Released: 09/18/2001 Document Revised:  08/13/2014 Document Reviewed: 12/18/2010 Elsevier Interactive Patient Education [employer].

## 2016-05-16 NOTE — Progress Notes (Signed)
Male Yrly physical  Impression and Recommendations:    1. Encounter for wellness examination   2. Health education/counseling   3. Counseling on health promotion and disease prevention   4. External hemorrhoids   5. Benign prostatic hyperplasia  L>R; without lower urinary tract symptoms   6. Special screening for malignant neoplasms, colon     1) Fasting blood work obtained  2) - Counseled patient on pathophysiology of disease of Hemorroids and discussed various OTC treatment options if they flare up, which often includes dietary and lifestyle modifications- ( water, stool softeners, fiber) as first line, in addition to discussing the risks and benefits of various medications.   I gave him some information on hemorrhoids. Please let me know if they become more bothersome or if you have any questions or concerns  - Anticipatory guidance given.   - Encouraged to return to clinic or call the office with any further questions or concerns.  3) patient without any BPH symptoms. We will monitor. We will recheck your left lobe of your prostate in approximately 6 months.  If enlarges-refer to urology.   Please make an appointment for this in addition to your regular chronic follow-up office visits.   4) Anticipatory Guidance: Discussed importance of wearing a seatbelt while driving, not texting while driving;   sunscreen when outside along with skin surveillance; eating a balanced and modest diet; physical activity at least 25 minutes per day or 150 min/ week moderate to intense activity.  5) Immunizations / Screenings / Labs:  All immunizations are up-to-date per recommendations or will be updated today. - Declines flu vaccine.   Patient is due for dental and vision screens which pt will schedule independently. Will obtain CBC, CMP, HgA1c, Lipid panel, TSH and vit D when fasting, if not already done recently.   6) Weight:  BMI meaning discussed with patient. Improve nutrient density of  diet through increasing intake of fruits and vegetables and decreasing saturated fats, white flour products and refined sugars.    Orders Placed This Encounter  Procedures  . CBC with Differential/Platelet  . COMPLETE METABOLIC PANEL WITH GFR  . Hepatitis C antibody  . Hemoglobin A1c  . HIV antibody  . Lipid panel    Order Specific Question:   Has the patient fasted?    Answer:   Yes  . Magnesium  . Phosphorus  . TSH  . Vitamin B12  . VITAMIN D 25 Hydroxy (Vit-D Deficiency, Fractures)  . POC Hemoccult Bld/Stl (1-Cd Office Dx)    Patient's Medications  New Prescriptions   No medications on file  Previous Medications   ACYCLOVIR (ZOVIRAX) 800 MG TABLET    Take 1 tablet by mouth daily.   CINNAMON 500 MG CAPSULE    Take 1 capsule by mouth daily.   OMEGA-3 FATTY ACIDS (FISH OIL) 1000 MG CAPS    Take 1 capsule by mouth daily.   ROPINIROLE (REQUIP) 2 MG TABLET    Take 1 tablet by mouth daily.   TURMERIC CURCUMIN 500 MG CAPS    Take 1 capsule by mouth daily.  Modified Medications   No medications on file  Discontinued Medications   GARLIC 10 MG CAPS    Take 1 capsule by mouth daily.   Please see AVS handed out to patient at the end of our visit for further patient instructions/ counseling done pertaining to today's office visit.  Gross side effects, risk and benefits, and alternatives of medications discussed with patient.  Patient is aware that all medications have potential side effects and we are unable to predict every side effect or drug-drug interaction that may occur.  Expresses verbal understanding and consents to current therapy plan and treatment regimen.  Follow-up preventative CPE in 1 year. Follow-up office visit pending lab work.  F/up sooner for chronic care management and/or prn    Subjective:    CC: CPE  HPI: Terry Howard is a 60 y.o. male who presents to [facility] today for a yearly health maintenance exam.    Health  Maintenance Summary Reviewed and updated, unless pt declines services.  Colonoscopy:  Due in 2018  Declines flu shot today  Tobacco History Reviewed: no  Alcohol: No concerns, no excessive use  Exercise Habits: squats, legs etc  STD concerns: none, but desires HIV and hepatitis C screening.  Drug Use: None  Birth control method: n/a; pt states "he had a hysterectomy from Dr Marcello Fennel in past".  I believe he means a vasectomy.  Testicular/penile concerns: no  Cancer Family History: MOm- breast ca age 2.   No Dermatologist.    Wt Readings from Last 3 Encounters:  05/16/16 151 lb 1.6 oz (68.5 kg)  02/15/16 148 lb (67.1 kg)  09/10/14 156 lb (70.8 kg)   BP Readings from Last 3 Encounters:  05/16/16 103/66  02/15/16 113/78  09/10/14 117/76   Pulse Readings from Last 3 Encounters:  05/16/16 68  02/15/16 87  09/10/14 72    Patient Active Problem List   Diagnosis Date Noted  . External hemorrhoids 05/18/2016  . Benign prostatic hyperplasia  L>R; without lower urinary tract symptoms 05/18/2016  . Health education/counseling 02/25/2016  . Restless legs syndrome (RLS) 02/15/2016  . RLS (restless legs syndrome) 02/15/2016  . Erectile dysfunction 02/15/2016  . h/o CLinically Signficant Anxiety 01/05/2014  . h/o Clinical depression 12/31/2013  . HLD (hyperlipidemia) 12/18/2013  . Genital Herpes 11/18/2013  . Cannot sleep 11/18/2013  . Extremity pain 11/18/2013  . Abnormal findings on diagnostic imaging of abdomen 10/29/2013  . 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 Polypharmacy 08/18/2013  . Other long term (current) drug therapy 08/18/2013  . Chronic pain 08/11/2013  . Other specified disorders of urinary tract 07/09/2012  . Abnormality of gait 07/09/2012  . Lumbago 07/09/2012  . Disturbance of skin sensation 07/09/2012  . 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 Chronic insomnia   . Chronic low  back pain    Annular tear at the L2-3 and L4-5 levels 4  . Gait disorder   . Hereditary spastic paraparesis (HCC)   . Hyperlipidemia   . 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 Neurogenic bladder   . RLS (restless legs syndrome)     Past Surgical History:  Procedure Laterality Date  . APPENDECTOMY    . BREAST SURGERY Bilateral    cysts  . COLONOSCOPY    . KNEE ARTHROSCOPY     right  . SHOULDER SURGERY     Rotator cuff, left  . ULNAR TUNNEL RELEASE Left 09/10/2014   Procedure: LEFT CUBITAL TUNNEL RELEASE;  Surgeon: [name], MD;  Location: [facility];  Service: Orthopedics;  Laterality: Left;    Family History  Problem Relation Age of Onset  . Cancer Mother     breast cancer  . Other Mother   . Hyperlipidemia Mother   . Stroke Father  Intracranial hemorrhage  . Other Brother   . Other Brother   . Other Brother     History  Drug Use No  ,  History  Alcohol Use  . 4.2 oz/week  . 7 Cans of beer per week    Comment: Consumes alcohol on occasion  ,  History  Smoking Status  . Never Smoker  Smokeless Tobacco  . Never Used  ,  History  Sexual Activity  . Sexual activity: Yes  . Birth control/ protection: Other-see comments    Comment: vasectomy    Patient's Medications  New Prescriptions   No medications on file  Previous Medications   ACYCLOVIR (ZOVIRAX) 800 MG TABLET    Take 1 tablet by mouth daily.   CINNAMON 500 MG CAPSULE    Take 1 capsule by mouth daily.   OMEGA-3 FATTY ACIDS (FISH OIL) 1000 MG CAPS    Take 1 capsule by mouth daily.   ROPINIROLE (REQUIP) 2 MG TABLET    Take 1 tablet by mouth daily.   TURMERIC CURCUMIN 500 MG CAPS    Take 1 capsule by mouth daily.  Modified Medications   No medications on file  Discontinued Medications   GARLIC 10 MG CAPS    Take 1 capsule by mouth daily.    Pregabalin and Statins  Review of Systems  Constitutional: Negative.  Negative for chills, diaphoresis, fever,  malaise/fatigue and weight loss.  HENT: Negative.  Negative for congestion, sore throat and tinnitus.   Eyes: Negative.  Negative for blurred vision, double vision and photophobia.  Respiratory: Negative.  Negative for cough and wheezing.   Cardiovascular: Negative.  Negative for chest pain and palpitations.  Gastrointestinal: Negative.  Negative for blood in stool, diarrhea, nausea and vomiting.  Genitourinary: Negative.  Negative for dysuria, frequency and urgency.  Musculoskeletal: Positive for myalgias. Negative for joint pain.  Skin: Negative.  Negative for itching and rash.  Neurological: Negative.  Negative for dizziness, focal weakness, weakness and headaches.  Endo/Heme/Allergies: Positive for environmental allergies. Negative for polydipsia. Does not bruise/bleed easily.  Psychiatric/Behavioral: Negative.  Negative for depression and memory loss. The patient is not nervous/anxious and does not have insomnia.      Objective:     Blood pressure 103/66, pulse 68, height 5' 6.75" (1.695 m), weight 151 lb 1.6 oz (68.5 kg). Body mass index is 23.84 kg/m. General Appearance:    Alert, cooperative, no distress, appears stated age  Head:    Normocephalic, without obvious abnormality, atraumatic  Eyes:    PERRL, conjunctiva/corneas clear, EOM's intact, fundi    benign, both eyes  Ears:    Normal TM's and external ear canals, both ears  Nose:   Nares normal, septum midline, mucosa normal, no drainage    or sinus tenderness  Throat:   Lips w/o lesion, mucosa moist, and tongue normal; teeth and   gums normal  Neck:   Supple, symmetrical, trachea midline, no adenopathy;    thyroid:  no enlargement/tenderness/nodules; no carotid   bruit or JVD  Back:     Symmetric, no curvature, ROM normal, no CVA tenderness  Lungs:     Clear to auscultation bilaterally, respirations unlabored, no       Wh/ R/ R  Chest Wall:    No tenderness or gross deformity; normal excursion   Heart:    Regular rate  and rhythm, S1 and S2 normal, no murmur, rub   or gallop  Abdomen:     Soft, non-tender, bowel sounds  active all four quadrants, NO   G/R/R, no masses, no organomegaly  Genitalia:    Ext genitalia: without lesion, no penile rash or discharge, no hernias appreciated   Rectal:    Normal tone, prostate slightly enlarged on the left lateral aspect of the lobe versus right but nontender, smooth and regular, no tenderness; guaiac negative stool; external hemorrhoids present 2, nonthrombosed, nonbleeding and nontender.  Extremities:   Extremities normal, atraumatic, no cyanosis or gross edema  Pulses:   2+ and symmetric all extremities  Skin:   Warm, dry, Skin color, texture, turgor normal, no obvious rashes or lesions  M-Sk:   Ambulates * 4 w/o difficulty, no gross deformities, tone WNL  Neurologic:   CNII-XII intact, normal strength, sensation and reflexes    Throughout Psych:  No HI/SI, judgement and insight good, Euthymic mood. Full Affect.

## 2016-05-17 LAB — COMPLETE METABOLIC PANEL WITH GFR
ALBUMIN: 4 g/dL (ref 3.6–5.1)
ALT: 27 U/L (ref 9–46)
AST: 24 U/L (ref 10–35)
Alkaline Phosphatase: 78 U/L (ref 40–115)
BUN: 18 mg/dL (ref 7–25)
CALCIUM: 9.1 mg/dL (ref 8.6–10.3)
CHLORIDE: 106 mmol/L (ref 98–110)
CO2: 24 mmol/L (ref 20–31)
CREATININE: 1.13 mg/dL (ref 0.70–1.33)
GFR, Est African American: 82 mL/min (ref >=60)
GFR, Est Non African American: 71 mL/min (ref >=60)
GLUCOSE: 85 mg/dL (ref 65–99)
Potassium: 4.5 mmol/L (ref 3.5–5.3)
SODIUM: 141 mmol/L (ref 135–146)
TOTAL PROTEIN: 6.5 g/dL (ref 6.1–8.1)
Total Bilirubin: 0.6 mg/dL (ref 0.2–1.2)

## 2016-05-17 LAB — CBC WITH DIFFERENTIAL/PLATELET
Basophils Absolute: 0 cells/uL (ref 0–200)
Basophils Relative: 0 %
EOS ABS: 232 {cells}/uL (ref 15–500)
EOS PCT: 4 %
HCT: 47.2 % (ref 38.5–50.0)
Hemoglobin: 16.3 g/dL (ref 13.2–17.1)
Lymphocytes Relative: 33 %
Lymphs Abs: 1914 cells/uL (ref 850–3900)
MCH: 31.5 pg (ref 27.0–33.0)
MCHC: 34.5 g/dL (ref 32.0–36.0)
MCV: 91.1 fL (ref 80.0–100.0)
MONOS PCT: 8 %
MPV: 11.2 fL (ref 7.5–12.5)
Monocytes Absolute: 464 cells/uL (ref 200–950)
NEUTROS ABS: 3190 {cells}/uL (ref 1500–7800)
Neutrophils Relative %: 55 %
PLATELETS: 208 10*3/uL (ref 140–400)
RBC: 5.18 MIL/uL (ref 4.20–5.80)
RDW: 14.2 % (ref 11.0–15.0)
WBC: 5.8 10*3/uL (ref 3.8–10.8)

## 2016-05-17 LAB — HEMOGLOBIN A1C
Hgb A1c MFr Bld: 4.7 % (ref <5.7)
Mean Plasma Glucose: 88 mg/dL

## 2016-05-17 LAB — TSH: TSH: 5.14 mIU/L — ABNORMAL HIGH (ref 0.40–4.50)

## 2016-05-17 LAB — LIPID PANEL
CHOL/HDL RATIO: 4.8 ratio (ref <=5.0)
CHOLESTEROL: 195 mg/dL (ref 125–200)
HDL: 41 mg/dL (ref >=40)
LDL Cholesterol: 137 mg/dL — ABNORMAL HIGH (ref <130)
TRIGLYCERIDES: 86 mg/dL (ref <150)
VLDL: 17 mg/dL (ref <30)

## 2016-05-17 LAB — VITAMIN D 25 HYDROXY (VIT D DEFICIENCY, FRACTURES): Vit D, 25-Hydroxy: 36 ng/mL (ref 30–100)

## 2016-05-17 LAB — HEPATITIS C ANTIBODY: HCV AB: NEGATIVE

## 2016-05-17 LAB — HIV ANTIBODY (ROUTINE TESTING W REFLEX): HIV 1&2 Ab, 4th Generation: NONREACTIVE

## 2016-05-17 LAB — VITAMIN B12: Vitamin B-12: 702 pg/mL (ref 200–1100)

## 2016-05-17 LAB — PHOSPHORUS: Phosphorus: 3.1 mg/dL (ref 2.5–4.5)

## 2016-05-17 LAB — MAGNESIUM: Magnesium: 2.1 mg/dL (ref 1.5–2.5)

## 2016-05-18 DIAGNOSIS — K648 Other hemorrhoids: Secondary | ICD-10-CM | POA: Insufficient documentation

## 2016-05-18 DIAGNOSIS — K644 Residual hemorrhoidal skin tags: Secondary | ICD-10-CM | POA: Insufficient documentation

## 2016-05-18 DIAGNOSIS — N400 Enlarged prostate without lower urinary tract symptoms: Secondary | ICD-10-CM | POA: Insufficient documentation

## 2016-05-22 ENCOUNTER — Other Ambulatory Visit (INDEPENDENT_AMBULATORY_CARE_PROVIDER_SITE_OTHER): Payer: 59

## 2016-05-22 DIAGNOSIS — Z1211 Encounter for screening for malignant neoplasm of colon: Secondary | ICD-10-CM | POA: Diagnosis not present

## 2016-05-22 LAB — POC HEMOCCULT BLD/STL (HOME/3-CARD/SCREEN)
Card #3 Fecal Occult Blood, POC: NEGATIVE
FECAL OCCULT BLD: NEGATIVE
Fecal Occult Blood, POC: NEGATIVE

## 2016-06-12 ENCOUNTER — Ambulatory Visit: Payer: 59 | Admitting: Family Medicine

## 2016-06-21 ENCOUNTER — Encounter: Payer: Self-pay | Admitting: Family Medicine

## 2016-06-21 ENCOUNTER — Ambulatory Visit (INDEPENDENT_AMBULATORY_CARE_PROVIDER_SITE_OTHER): Payer: 59 | Admitting: Family Medicine

## 2016-06-21 VITALS — BP 106/69 | HR 77 | Ht 66.75 in | Wt 152.7 lb

## 2016-06-21 DIAGNOSIS — G114 Hereditary spastic paraplegia: Secondary | ICD-10-CM

## 2016-06-21 DIAGNOSIS — E559 Vitamin D deficiency, unspecified: Secondary | ICD-10-CM | POA: Diagnosis not present

## 2016-06-21 DIAGNOSIS — R7989 Other specified abnormal findings of blood chemistry: Secondary | ICD-10-CM | POA: Insufficient documentation

## 2016-06-21 DIAGNOSIS — R946 Abnormal results of thyroid function studies: Secondary | ICD-10-CM

## 2016-06-21 DIAGNOSIS — E7800 Pure hypercholesterolemia, unspecified: Secondary | ICD-10-CM

## 2016-06-21 DIAGNOSIS — G2581 Restless legs syndrome: Secondary | ICD-10-CM

## 2016-06-21 MED ORDER — VITAMIN D3 125 MCG (5000 UT) PO TABS: ORAL_TABLET | ORAL | 3 refills | Status: DC

## 2016-06-21 NOTE — Progress Notes (Signed)
Assessment and plan:  1. Pure hypercholesterolemia   2. Vitamin D insufficiency   3. 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  20 yrs ago- started dragging feet, affects primarily legs- weakness, unsteady.  - F/Up Neuro as indicated  Restless legs syndrome (RLS) Treated by neurology-Dr. WJannifer Franklin Vitamin D insufficiency Cont supp    New Prescriptions   CHOLECALCIFEROL (VITAMIN D3) 5000 UNITS TABS    5,000 IU OTC vitamin D3 daily.    Modified Medications   No medications on file    Discontinued Medications   No medications on file     Return in about 5 months (around 11/19/2016) for tsh, t3, t4, vit D and OV with me.  Anticipatory guidance and routine counseling done re: condition, txmnt options and need for follow up. All questions of patient's were answered.   Gross side effects, risk and benefits, and alternatives of medications discussed with patient.  Patient is aware that all medications have potential side effects and we are unable to predict every sideeffect or drug-drug interaction that may occur.  Expresses verbal understanding and consents to current therapy plan and treatment regiment.  Please see AVS handed out to patient at the end of our visit for additional patient instructions/ counseling done pertaining to today's office visit.  Note: This document was prepared using Dragon voice recognition software and may include unintentional dictation  errors.   ----------------------------------------------------------------------------------------------------------------------  Subjective:   CC:   Terry HOEFLINGis a 60y.o. male [facility] and discussion of recent bloodwork that was done.  1. All recent blood work that we ordered was reviewed with patient today.  Patient was counseled on all abnormalities and we discussed dietary and lifestyle changes that could help those values (also medications when appropriate).  Extensive health counseling performed and all patient's concerns/ questions were addressed.     Wt Readings from Last 3 Encounters:  06/21/16 152 lb 11.2 oz (69.3 kg)  05/16/16 151 lb 1.6 oz (68.5 kg)  02/15/16 148 lb (67.1 kg)   BP Readings from Last 3 Encounters:  06/21/16 106/69  05/16/16 103/66  02/15/16 113/78   Pulse Readings from Last 3 Encounters:  06/21/16 77  05/16/16 68  02/15/16 87   BMI Readings from Last 3 Encounters:  06/21/16 24.10 kg/m  05/16/16 23.84 kg/m  02/15/16 23.35 kg/m     Patient Care Team    Relationship Specialty Notifications Start End  DMellody Dance DO PCP - General Family Medicine  02/15/16   FMagnus Sinning MD Consulting Physician Physical Medicine and Rehabilitation  05/22/16   CKathrynn Ducking MD Consulting Physician Neurology  05/22/16     Full medical history updated and reviewed in the office today  Patient Active Problem List   Diagnosis Date Noted  . Vitamin D insufficiency 06/21/2016  . Elevated TSH 06/21/2016  . External hemorrhoids 05/18/2016  . Benign prostatic hyperplasia  L>R; without lower urinary tract symptoms 05/18/2016  . Health education/counseling 02/25/2016  .  Restless legs syndrome (RLS) 02/15/2016  . Erectile dysfunction 02/15/2016  . h/o CLinically Signficant Anxiety 01/05/2014  . h/o Clinical depression 12/31/2013  . HLD (hyperlipidemia) 12/18/2013  . Genital Herpes  11/18/2013  . Cannot sleep 11/18/2013  . Extremity pain 11/18/2013  . Abnormal findings on diagnostic imaging of abdomen 10/29/2013  . 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 Polypharmacy 08/18/2013  . Other long term (current) drug therapy 08/18/2013  . Chronic pain 08/11/2013  . Other specified disorders of urinary tract 07/09/2012  . Abnormality of gait 07/09/2012  . Lumbago 07/09/2012  . Disturbance of skin sensation 07/09/2012  . 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 Chronic insomnia   . Chronic low back pain    Annular tear at the L2-3 and L4-5 levels 4  . Gait disorder   . 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 Hyperlipidemia   . 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 Neurogenic bladder   . RLS (restless legs syndrome)     Past Surgical History:  Procedure Laterality Date  . APPENDECTOMY    . BREAST SURGERY Bilateral    cysts  . COLONOSCOPY    . KNEE ARTHROSCOPY     right  . SHOULDER SURGERY     Rotator cuff, left  . ULNAR TUNNEL RELEASE Left 09/10/2014   Procedure: LEFT CUBITAL TUNNEL RELEASE;  Surgeon: Marianna Payment, MD;  Location: [facility];  Service: Orthopedics;  Laterality: Left;    Social History  Substance Use Topics  . Smoking status: Never Smoker  . Smokeless tobacco: Never Used  . Alcohol use 4.2 oz/week    7 Cans of beer per week     Comment: Consumes alcohol on occasion    Family Hx: Family History  Problem Relation Age of Onset  . Cancer Mother     breast cancer  . Other Mother   . Hyperlipidemia Mother   . Stroke Father     Intracranial hemorrhage  . Other Brother   . Other Brother   . Other Brother      Medications: Current Outpatient Prescriptions  Medication Sig Dispense Refill  . acyclovir (ZOVIRAX) 800 MG tablet Take 1 tablet by mouth daily.    . Cinnamon 500 MG capsule Take 1 capsule by mouth daily.    . Omega-3 Fatty Acids  (FISH OIL) 1000 MG CAPS Take 1 capsule by mouth daily.    Marland Kitchen rOPINIRole (REQUIP) 2 MG tablet Take 1 tablet by mouth daily.  99  . Turmeric Curcumin 500 MG CAPS Take 1 capsule by mouth daily.    . Cholecalciferol (VITAMIN D3) 5000 units TABS 5,000 IU OTC vitamin D3 daily. 90 tablet 3   No current facility-administered medications for this visit.     Allergies:  Allergies  Allergen Reactions  . Pregabalin   . Statins     Other reaction(s): Muscle Pain     ROS: Review of Systems  Constitutional: Negative.  Negative for chills, diaphoresis, fever, malaise/fatigue and weight loss.  HENT: Negative.  Negative for congestion, sore throat and tinnitus.   Eyes: Negative.  Negative for blurred vision, double vision and photophobia.  Respiratory: Negative.  Negative for cough and wheezing.   Cardiovascular: Negative.  Negative for chest pain and palpitations.  Gastrointestinal: Negative.  Negative for blood in stool, diarrhea, nausea and vomiting.  Genitourinary: Negative.  Negative for dysuria, frequency and urgency.  Musculoskeletal: Negative.  Negative for joint pain and myalgias.  Skin: Negative.  Negative for itching and rash.  Neurological: Negative.  Negative for dizziness, focal weakness, weakness and headaches.  Endo/Heme/Allergies: Negative.  Negative for environmental allergies and polydipsia. Does not bruise/bleed easily.  Psychiatric/Behavioral: Negative.  Negative for depression and memory loss. The patient is not nervous/anxious and does not have insomnia.    Objective:  Blood pressure 106/69, pulse 77, height 5' 6.75" (1.695 m), weight 152 lb 11.2 oz (69.3 kg). Body mass index is 24.1 kg/m. Gen:   Well NAD, A and O *3 HEENT:    [place]/AT, EOMI,  MMM, OP- clr Lungs:   Normal work of breathing. CTA B/L, no Wh, rhonchi Heart:   RRR, S1, S2 WNL's, no MRG Abd:   No gross distention Exts:    warm, pink,  Brisk capillary refill, warm and well perfused.  Psych:    No HI/SI, judgement  and insight good, Euthymic mood. Full Affect.     Recent Results (from the past 2160 hour(s))  POC Hemoccult Bld/Stl (1-Cd Office Dx)     Status: Normal   Collection Time: 05/16/16 10:15 AM  Result Value Ref Range   Card #1 Date 10/11/21017    Fecal Occult Blood, POC Negative Negative  CBC with Differential/Platelet     Status: None   Collection Time: 05/16/16 10:27 AM  Result Value Ref Range   WBC 5.8 3.8 - 10.8 K/uL   RBC 5.18 4.20 - 5.80 MIL/uL   Hemoglobin 16.3 13.2 - 17.1 g/dL   HCT 47.2 38.5 - 50.0 %   MCV 91.1 80.0 - 100.0 fL   MCH 31.5 27.0 - 33.0 pg   MCHC 34.5 32.0 - 36.0 g/dL   RDW 14.2 11.0 - 15.0 %   Platelets 208 140 - 400 K/uL   MPV 11.2 7.5 - 12.5 fL   Neutro Abs 3,190 1,500 - 7,800 cells/uL   Lymphs Abs 1,914 850 - 3,900 cells/uL   Monocytes Absolute 464 200 - 950 cells/uL   Eosinophils Absolute 232 15 - 500 cells/uL   Basophils Absolute 0 0 - 200 cells/uL   Neutrophils Relative % 55 %   Lymphocytes Relative 33 %   Monocytes Relative 8 %   Eosinophils Relative 4 %   Basophils Relative 0 %   Smear Review Criteria for review not met   COMPLETE METABOLIC PANEL WITH GFR     Status: None   Collection Time: 05/16/16 10:27 AM  Result Value Ref Range   Sodium 141 135 - 146 mmol/L   Potassium 4.5 3.5 - 5.3 mmol/L   Chloride 106 98 - 110 mmol/L   CO2 24 20 - 31 mmol/L   Glucose, Bld 85 65 - 99 mg/dL   BUN 18 7 - 25 mg/dL   Creat 1.13 0.70 - 1.33 mg/dL    Comment:   For patients > or = 60 years of age: The upper reference limit for Creatinine is approximately 13% higher for people identified as African-American.      Total Bilirubin 0.6 0.2 - 1.2 mg/dL   Alkaline Phosphatase 78 40 - 115 U/L   AST 24 10 - 35 U/L   ALT 27 9 - 46 U/L   Total Protein 6.5 6.1 - 8.1 g/dL   Albumin 4.0 3.6 - 5.1 g/dL   Calcium 9.1 8.6 - 10.3 mg/dL   GFR, Est African American 82 >=60 mL/min   GFR, Est Non African American 71 >=60 mL/min  Hepatitis C antibody  Status: None    Collection Time: 05/16/16 10:27 AM  Result Value Ref Range   HCV Ab NEGATIVE NEGATIVE  Hemoglobin A1c     Status: None   Collection Time: 05/16/16 10:27 AM  Result Value Ref Range   Hgb A1c MFr Bld 4.7 <5.7 %    Comment:   For the purpose of screening for the presence of diabetes:   <5.7%       Consistent with the absence of diabetes 5.7-6.4 %   Consistent with increased risk for diabetes (prediabetes) >=6.5 %     Consistent with diabetes   This assay result is consistent with a decreased risk of diabetes.   Currently, no consensus exists regarding use of hemoglobin A1c for diagnosis of diabetes in children.   According to American Diabetes Association (ADA) guidelines, hemoglobin A1c <7.0% represents optimal control in non-pregnant diabetic patients. Different metrics may apply to specific patient populations. Standards of Medical Care in Diabetes (ADA).      Mean Plasma Glucose 88 mg/dL  HIV antibody     Status: None   Collection Time: 05/16/16 10:27 AM  Result Value Ref Range   HIV 1&2 Ab, 4th Generation NONREACTIVE NONREACTIVE    Comment:   HIV-1 antigen and HIV-1/HIV-2 antibodies were not detected.  There is no laboratory evidence of HIV infection.   HIV-1/2 Antibody Diff        Not indicated. HIV-1 RNA, Qual TMA          Not indicated.     PLEASE NOTE: This information has been disclosed to you from records whose confidentiality may be protected by state law. If your state requires such protection, then the state law prohibits you from making any further disclosure of the information without the specific written consent of the person to whom it pertains, or as otherwise permitted by law. A general authorization for the release of medical or other information is NOT sufficient for this purpose.   The performance of this assay has not been clinically validated in patients less than 83 years old.   For additional information please refer  to http://education.quest[facility].com/faq/FAQ106.  (This link is being provided for informational/educational purposes only.)     Lipid panel     Status: Abnormal   Collection Time: 05/16/16 10:27 AM  Result Value Ref Range   Cholesterol 195 125 - 200 mg/dL   Triglycerides 86 <150 mg/dL   HDL 41 >=40 mg/dL   Total CHOL/HDL Ratio 4.8 <=5.0 Ratio   VLDL 17 <30 mg/dL   LDL Cholesterol 137 (H) <130 mg/dL    Comment:   Total Cholesterol/HDL Ratio:CHD Risk                        Coronary Heart Disease Risk Table                                        Men       Women          1/2 Average Risk              3.4        3.3              Average Risk              5.0        4.4  2X Average Risk              9.6        7.1           3X Average Risk             23.4       11.0 Use the calculated Patient Ratio above and the CHD Risk table  to determine the patient's CHD Risk.   Magnesium     Status: None   Collection Time: 05/16/16 10:27 AM  Result Value Ref Range   Magnesium 2.1 1.5 - 2.5 mg/dL  Phosphorus     Status: None   Collection Time: 05/16/16 10:27 AM  Result Value Ref Range   Phosphorus 3.1 2.5 - 4.5 mg/dL  TSH     Status: Abnormal   Collection Time: 05/16/16 10:27 AM  Result Value Ref Range   TSH 5.14 (H) 0.40 - 4.50 mIU/L  Vitamin B12     Status: None   Collection Time: 05/16/16 10:27 AM  Result Value Ref Range   Vitamin B-12 702 200 - 1,100 pg/mL  VITAMIN D 25 Hydroxy (Vit-D Deficiency, Fractures)     Status: None   Collection Time: 05/16/16 10:27 AM  Result Value Ref Range   Vit D, 25-Hydroxy 36 30 - 100 ng/mL    Comment: Vitamin D Status           25-OH Vitamin D        Deficiency                <20 ng/mL        Insufficiency         20 - 29 ng/mL        Optimal             > or = 30 ng/mL   For 25-OH Vitamin D testing on patients on D2-supplementation and patients for whom quantitation of D2 and D3 fractions is required, the [facility] 25-OH VIT D,  (D2,D3), LC/MS/MS is recommended: order code [id number] (patients > 2 yrs).   POC Hemoccult Bld/Stl (3-Cd Home Screen)     Status: Normal   Collection Time: 05/22/16  2:27 PM  Result Value Ref Range   Card #1 Date 05/19/2016    Fecal Occult Blood, POC Negative Negative   Card #2 Date 05/20/2016    Card #2 Fecal Occult Blod, POC Negative    Card #3 Date 05/21/2016    Card #3 Fecal Occult Blood, POC Negative

## 2016-06-21 NOTE — Patient Instructions (Addendum)
You need to drink more water---> 1/2 of weight in ounces of water per day is recommended  Recommend we obtain free t4 and T3 in addition to TSH in 4-7756mo  Cont to keep your BP low--->  10 year risk is 7.1% for heart disease/ stroke     Guidelines for a Low Cholesterol, Low Saturated Fat Diet   Fats - Limit total intake of fats and oils. - Avoid butter, stick margarine, shortening, lard, palm and coconut oils. - Limit mayonnaise, salad dressings, gravies and sauces, unless they are homemade with low-fat ingredients. - Limit chocolate. - Choose low-fat and nonfat products, such as low-fat mayonnaise, low-fat or non-hydrogenated peanut butter, low-fat or fat-free salad dressings and nonfat gravy. - Use vegetable oil, such as canola or olive oil. - Look for margarine that does not contain trans fatty acids. - Use nuts in moderate amounts. - Read ingredient labels carefully to determine both amount and type of fat present in foods. Limit saturated and trans fats! - Avoid high-fat processed and convenience foods.  Meats and Meat Alternatives - Choose fish, chicken, [place] and lean meats. - Use dried beans, peas, lentils and tofu. - Limit egg yolks to three to four per week. - If you eat red meat, limit to no more than three servings per week and choose loin or round cuts. - Avoid fatty meats, such as bacon, sausage, franks, luncheon meats and ribs. - Avoid all organ meats, including liver.  Dairy - Choose nonfat or low-fat milk, yogurt and cottage cheese. - Most cheeses are high in fat. Choose cheeses made from non-fat milk, such as mozzarella and ricotta cheese. - Choose light or fat-free cream cheese and sour cream. - Avoid cream and sauces made with cream.  Fruits and Vegetables - Eat a wide variety of fruits and vegetables. - Use lemon juice, vinegar or "mist" olive oil on vegetables. - Avoid adding sauces, fat or oil to vegetables.  Breads, Cereals and Grains - Choose  whole-grain breads, cereals, pastas and rice. - Avoid high-fat snack foods, such as granola, cookies, pies, pastries, doughnuts and croissants.  Cooking Tips - Avoid deep fried foods. - Trim visible fat off meats and remove skin from poultry before cooking. - Bake, broil, boil, poach or roast poultry, fish and lean meats. - Drain and discard fat that drains out of meat as you cook it. - Add little or no fat to foods. - Use vegetable oil sprays to grease pans for cooking or baking. - Steam vegetables. - Use herbs or no-oil marinades to flavor foods.

## 2016-06-21 NOTE — Assessment & Plan Note (Signed)
Recommend we obtain free t4 and T3 in addition to TSH in 4-1475mo  Pt is ASx

## 2016-06-21 NOTE — Assessment & Plan Note (Signed)
7.1% 10 yr risk, intol to statins, no ASA needed based on AHA/ ACC guidelines  Diet and exercise  Handouts provided

## 2016-07-07 NOTE — Assessment & Plan Note (Signed)
Treated by neurology-Dr. Anne HahnWillis

## 2016-07-07 NOTE — Assessment & Plan Note (Signed)
It is Hereditary-  Difficulty with walking, trips easily.  20 yrs ago- started dragging feet, affects primarily legs- weakness, unsteady.  - F/Up Neuro as indicated

## 2016-07-07 NOTE — Assessment & Plan Note (Signed)
Cont supp 

## 2016-07-26 ENCOUNTER — Other Ambulatory Visit: Payer: Self-pay

## 2016-07-26 MED ORDER — ACYCLOVIR 800 MG PO TABS: 800.0000 mg | ORAL_TABLET | Freq: Every day | ORAL | 1 refills | Status: DC

## 2016-07-26 MED FILL — ACYCLOVIR 800 MG TABLET: 800 | 90 days supply | Qty: 90 | Fill #0

## 2016-08-06 MED FILL — rOPINIRole HCL 2 MG TABS: 2 | 90 days supply | Qty: 180 | Fill #3

## 2016-10-23 MED FILL — ACYCLOVIR 800 MG TABLET: 800 | 90 days supply | Qty: 90 | Fill #1

## 2016-12-11 ENCOUNTER — Other Ambulatory Visit: Payer: Self-pay

## 2016-12-11 NOTE — Telephone Encounter (Signed)
He gets that from his neuro doc- Dr Anne HahnWillis per my OV NOTE.   Have him ask the treating doc for that med.   Thanks

## 2016-12-11 NOTE — Telephone Encounter (Signed)
We have not prescribed these medications for the patient previously.  Please review and refill if appropriate.  T. Demichael Traum, CMA  

## 2016-12-12 NOTE — Telephone Encounter (Signed)
MyChart message sent to patient with this information.  Tiajuana Amass. Nelson, CMA

## 2016-12-12 NOTE — Telephone Encounter (Signed)
Opened in error. T. Renesmee Raine, CMA 

## 2016-12-13 ENCOUNTER — Encounter: Payer: Self-pay | Admitting: Family Medicine

## 2016-12-13 ENCOUNTER — Ambulatory Visit (INDEPENDENT_AMBULATORY_CARE_PROVIDER_SITE_OTHER): Payer: 59 | Admitting: Family Medicine

## 2016-12-13 VITALS — BP 105/70 | HR 72 | Ht 66.75 in | Wt 146.0 lb

## 2016-12-13 DIAGNOSIS — E559 Vitamin D deficiency, unspecified: Secondary | ICD-10-CM | POA: Diagnosis not present

## 2016-12-13 DIAGNOSIS — R269 Unspecified abnormalities of gait and mobility: Secondary | ICD-10-CM

## 2016-12-13 DIAGNOSIS — Z789 Other specified health status: Secondary | ICD-10-CM | POA: Diagnosis not present

## 2016-12-13 DIAGNOSIS — E7800 Pure hypercholesterolemia, unspecified: Secondary | ICD-10-CM

## 2016-12-13 DIAGNOSIS — G114 Hereditary spastic paraplegia: Secondary | ICD-10-CM | POA: Diagnosis not present

## 2016-12-13 DIAGNOSIS — R7989 Other specified abnormal findings of blood chemistry: Secondary | ICD-10-CM

## 2016-12-13 DIAGNOSIS — R946 Abnormal results of thyroid function studies: Secondary | ICD-10-CM | POA: Diagnosis not present

## 2016-12-13 DIAGNOSIS — G2581 Restless legs syndrome: Secondary | ICD-10-CM

## 2016-12-13 MED ORDER — ROPINIROLE HCL 2 MG PO TABS: 1.0000 mg | ORAL_TABLET | Freq: Every day | ORAL | 1 refills | Status: DC

## 2016-12-13 MED ORDER — CYCLOBENZAPRINE HCL 5 MG PO TABS: 2.5000 mg | ORAL_TABLET | Freq: Three times a day (TID) | ORAL | 1 refills | Status: DC | PRN

## 2016-12-13 MED FILL — CYCLOBENZAPRINE 5 MG TABLET: 5 | 90 days supply | Qty: 135 | Fill #0

## 2016-12-13 MED FILL — rOPINIRole HCL 2 MG TABS: 2 | 90 days supply | Qty: 45 | Fill #0

## 2016-12-13 NOTE — Progress Notes (Signed)
Impression and Recommendations:    1. Hereditary spastic paraparesis (HCC)   2. Restless legs syndrome (RLS)   3. Statin intolerance   4. Abnormality of gait   5. Elevated TSH   6. Pure hypercholesterolemia   7. Vitamin D insufficiency    - I will contact a few specialists and see if they think doing botox injections and/or tendon releases etc will help with sx  ( I will send feelers out to Dr Anne Hahn- Neuro and Dr Wynn Banker- PM&R etc and will reach out to others if needed  )   - Will contact pt after I speak with them about what other txmnt options exist for pt. - Rf flexeril for [name] given - Obtian labs- see orders - Add mag, phos and B12 to Vit D, TSH, T4 and FLP  The patient was counseled, risk factors were discussed, anticipatory guidance given.   Meds ordered this encounter  Medications  . DISCONTD: cyclobenzaprine (FLEXERIL) 5 MG tablet    Sig: Take 0.5 tablets by mouth at bedtime.  . cyclobenzaprine (FLEXERIL) 5 MG tablet    Sig: Take 0.5 tablets (2.5 mg total) by mouth 3 (three) times daily as needed for muscle spasms.    Dispense:  135 tablet    Refill:  1  . rOPINIRole (REQUIP) 2 MG tablet    Sig: Take 0.5 tablets (1 mg total) by mouth daily.    Dispense:  90 tablet    Refill:  1    Orders Placed This Encounter  Procedures  . Lipid panel  . T4, free  . TSH  . VITAMIN D 25 Hydroxy (Vit-D Deficiency, Fractures)  . Magnesium  . Phosphorus  . Vitamin B12   Gross side effects, risk and benefits, and alternatives of medications and treatment plan in general discussed with patient.  Patient is aware that all medications have potential side effects and we are unable to predict every side effect or drug-drug interaction that may occur.   Patient will call with any questions prior to using medication if they have concerns.  Expresses verbal understanding and consents to current therapy and treatment regimen.  No barriers to understanding were  identified.  Red flag symptoms and signs discussed in detail.  Patient expressed understanding regarding what to do in case of emergency\urgent symptoms  Please see AVS handed out to patient at the end of our visit for further patient instructions/ counseling done pertaining to today's office visit.   Return in about 4 months (around 04/15/2017).     Note: This document was prepared using Dragon voice recognition software and may include unintentional dictation errors.  [name] 3:16 PM --------------------------------------------------------------------------------------------------------------------------------------------------------------------------------------------------------------------------------------------    Subjective:    CC:  Chief Complaint  Patient presents with  . Hyperlipidemia  . Elevated TSH  . restless leg syndrome    HPI: Terry Howard is a 61 y.o. male who presents to [facility] today for issues as discussed below.    recently went to Dentist for TMJ--> was given a mouth guard and flexeril.   It worked great for his muscle spasicity and RLS sx as well.  He was able to cut back to 1/2 tab requip BID and took 1/2 tab of 5mg  flexeril - worked great.    Wants RF of flexeril for his neurospasticity.  No s-e.  Tol well.     4 brothers with same condition- discouraged with condition/ angry at  times and other times depressing.  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  Trying to keep strong physically. Pt wishes there were other things that could be done   Wt Readings from Last 3 Encounters:  01/02/17 149 lb 8 oz (67.8 kg)  12/13/16 146 lb (66.2 kg)  06/21/16 152 lb 11.2 oz (69.3 kg)   BP Readings from Last 3 Encounters:  01/02/17 128/84  12/13/16 105/70  06/21/16 106/69   Pulse Readings from Last 3 Encounters:  01/02/17 84  12/13/16 72  06/21/16 77   BMI Readings from Last 3 Encounters:    01/02/17 23.59 kg/m  12/13/16 23.04 kg/m  06/21/16 24.10 kg/m     Patient Care Team    Relationship Specialty Notifications Start End  [name], DO PCP - General Family Medicine  02/15/16   Tyrell Antonio, MD Consulting Physician Physical Medicine and Rehabilitation  05/22/16   York Spaniel, MD Consulting Physician Neurology  05/22/16   Specialists, [facility]  Orthopedic Surgery  12/11/16      Patient Active Problem List   Diagnosis Date Noted  . Statin intolerance 12/13/2016    Priority: High  . Restless legs syndrome (RLS) 02/15/2016    Priority: High  . HLD (hyperlipidemia) 12/18/2013    Priority: High  . 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 Elevated TSH 06/21/2016    Priority: Medium  . Vitamin D insufficiency 06/21/2016    Priority: Low  . Abnormality of gait 07/09/2012    Priority: Low  . External hemorrhoids 05/18/2016  . Benign prostatic hyperplasia  L>R; without lower urinary tract symptoms 05/18/2016  . Health education/counseling 02/25/2016  . Erectile dysfunction 02/15/2016  . h/o CLinically Signficant Anxiety 01/05/2014  . h/o Clinical depression 12/31/2013  . Genital Herpes 11/18/2013  . Cannot sleep 11/18/2013  . Extremity pain 11/18/2013  . Abnormal findings on diagnostic imaging of abdomen 10/29/2013  . Polypharmacy 08/18/2013  . Other long term (current) drug therapy 08/18/2013  . Chronic pain 08/11/2013  . Other specified disorders of urinary tract 07/09/2012  . Lumbago 07/09/2012  . Disturbance of skin sensation 07/09/2012  . 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    Current Meds  Medication Sig  . acyclovir (ZOVIRAX) 800 MG tablet Take 1 tablet (800 mg total) by mouth daily.  . Cholecalciferol  (VITAMIN D3) 5000 units TABS 5,000 IU OTC vitamin D3 daily.  . Cinnamon 500 MG capsule Take 1 capsule by mouth daily.  . cyclobenzaprine (FLEXERIL) 5 MG tablet Take 0.5 tablets (2.5 mg total) by mouth 3 (three) times daily as needed for muscle spasms.  . Omega-3 Fatty Acids (FISH OIL) 1000 MG CAPS Take 1 capsule by mouth daily.  Marland Kitchen rOPINIRole (REQUIP) 2 MG tablet Take 0.5 tablets (1 mg total) by mouth daily.  . Turmeric Curcumin 500 MG CAPS Take 1 capsule by mouth daily.  . [DISCONTINUED] cyclobenzaprine (FLEXERIL) 5 MG tablet Take 0.5 tablets by mouth at bedtime.  . [DISCONTINUED] rOPINIRole (REQUIP) 2 MG tablet Take 1 tablet by mouth daily.    Allergies:  Allergies  Allergen Reactions  . Pregabalin   . Statins     Other reaction(s): Muscle Pain     Review of Systems: General:   Denies fever, chills, unexplained weight loss.  Optho/Auditory:  Denies visual changes, blurred vision/LOV Respiratory:   Denies wheeze, DOE more than baseline levels.  Cardiovascular:   Denies chest pain, palpitations, new onset peripheral edema  Gastrointestinal:   Denies nausea, vomiting, diarrhea, abd pain.  Genitourinary: Denies dysuria, freq/ urgency, flank pain or discharge from genitals.  Endocrine:     Denies hot or cold intolerance, polyuria, polydipsia. Musculoskeletal:   Denies unexplained myalgias, joint swelling, unexplained arthralgias, gait problems.  Skin:  Denies new onset rash, suspicious lesions Neurological:     Denies dizziness, unexplained weakness, numbness  Psychiatric/Behavioral:   Denies mood changes, suicidal or homicidal ideations, hallucinations    Objective:   Blood pressure 105/70, pulse 72, height 5' 6.75" (1.695 m), weight 146 lb (66.2 kg). Body mass index is 23.04 kg/m. General:  Well Developed, well nourished Neuro:  Alert and oriented,  extra-ocular muscles intact  HEENT:  Normocephalic, atraumatic, neck supple Skin:  no gross rash, warm, pink Cardiac:  RRR, S1  S2 Respiratory:  ECTA B/L and A/P, Not using accessory muscles, speaking in full sentences- unlabored. Vascular:  Ext warm, no cyanosis apprec.; cap RF less 2 sec. Psych:  Euthymic mood. Full Affect.

## 2016-12-13 NOTE — Patient Instructions (Addendum)
Muscle Cramps and Spasms Muscle cramps and spasms occur when a muscle or muscles tighten and you have no control over this tightening (involuntary muscle contraction). They are a common problem and can develop in any muscle. The most common place is in the calf muscles of the leg. Muscle cramps and muscle spasms are both involuntary muscle contractions, but there are some differences between the two:  Muscle cramps are painful. They come and go and may last a few seconds to 15 minutes. Muscle cramps are often more forceful and last longer than muscle spasms.  Muscle spasms may or may not be painful. They may also last just a few seconds or much longer. Certain medical conditions, such as diabetes or Parkinson disease, can make it more likely to develop cramps or spasms. However, cramps or spasms are usually not caused by a serious underlying problem. Common causes include:  Overexertion.  Overuse from repetitive motions, or doing the same thing over and over.  Remaining in a certain position for a long period of time.  Improper preparation, form, or technique while playing a sport or doing an activity.  Dehydration.  Injury.  Side effects of some medicines.  Abnormally low levels of the salts and ions in your blood (electrolytes), especially potassium and calcium. This could happen if you are taking water pills (diuretics) or if you are pregnant. In many cases, the cause of muscle cramps or spasms is unknown. Follow these instructions at home:  Stay well hydrated. Drink enough fluid to keep your urine clear or pale yellow.  Try massaging, stretching, and relaxing the affected muscle.  If directed, apply heat to tight or tense muscles as often as told by your health care provider. Use the heat source that your health care provider recommends, such as a moist heat pack or a heating pad.  Place a towel between your skin and the heat source.  Leave the heat on for 20-30  minutes.  Remove the heat if your skin turns bright red. This is especially important if you are unable to feel pain, heat, or cold. You may have a greater risk of getting burned.  If directed, put ice on the affected area. This may help if you are sore or have pain after a cramp or spasm.  Put ice in a plastic bag.  Place a towel between your skin and the bag.  Leavethe ice on for 20 minutes, 2-3 times a day.  Take over-the-counter and prescription medicines only as told by your health care provider.  Pay attention to any changes in your symptoms. Contact a health care provider if:  Your cramps or spasms get more severe or happen more often.  Your cramps or spasms do not improve over time. This information is not intended to replace advice given to you by your health care provider. Make sure you discuss any questions you have with your health care provider. Document Released: 01/12/2002 Document Revised: 08/24/2015 Document Reviewed: 04/26/2015 Elsevier Interactive Patient Education  2017 Elsevier Inc.    Restless Legs Syndrome Restless legs syndrome is a condition that causes uncomfortable feelings or sensations in the legs, especially while sitting or lying down. The sensations usually cause an overwhelming urge to move the legs. The arms can also sometimes be affected. The condition can range from mild to severe. The symptoms often interfere with a person's ability to sleep. What are the causes? The cause of this condition is not known. What increases the risk? This condition is  more likely to develop in:  People who are older than age 35.  Pregnant women. In general, restless legs syndrome is more common in women than in men.  People who have a family history of the condition.  People who have certain medical conditions, such as iron deficiency, kidney disease, Parkinson disease, or nerve damage.  People who take certain medicines, such as medicines for high blood  pressure, nausea, colds, allergies, depression, and some heart conditions. What are the signs or symptoms? The main symptom of this condition is uncomfortable sensations in the legs. These sensations may be:  Described as pulling, tingling, prickling, throbbing, crawling, or burning.  Worse while you are sitting or lying down.  Worse during periods of rest or inactivity.  Worse at night, often interfering with your sleep.  Accompanied by a very strong urge to move your legs.  Temporarily relieved by movement of your legs. The sensations usually affect both sides of the body. The arms can also be affected, but this is rare. People who have this condition often have tiredness during the day because of their lack of sleep at night. How is this diagnosed? This condition may be diagnosed based on your description of the symptoms. You may also have tests, including blood tests, to check for other conditions that may lead to your symptoms. In some cases, you may be asked to spend some time in a sleep lab so your sleeping can be monitored. How is this treated? Treatment for this condition is focused on managing the symptoms. Treatment may include:  Self-help and lifestyle changes.  Medicines. Follow these instructions at home:  Take medicines only as directed by your health care provider.  Try these methods to get temporary relief from the uncomfortable sensations:  Massage your legs.  Walk or stretch.  Take a cold or hot bath.  Practice good sleep habits. For example, go to bed and get up at the same time every day.  Exercise regularly.  Practice ways of relaxing, such as yoga or meditation.  Avoid caffeine and alcohol.  Do not use any tobacco products, including cigarettes, chewing tobacco, or electronic cigarettes. If you need help quitting, ask your health care provider.  Keep all follow-up visits as directed by your health care provider. This is important. Contact a health  care provider if: Your symptoms do not improve with treatment, or they get worse. This information is not intended to replace advice given to you by your health care provider. Make sure you discuss any questions you have with your health care provider. Document Released: 07/13/2002 Document Revised: 12/29/2015 Document Reviewed: 07/19/2014 Elsevier Interactive Patient Education  2017 [employer].

## 2016-12-14 LAB — VITAMIN D 25 HYDROXY (VIT D DEFICIENCY, FRACTURES): VIT D 25 HYDROXY: 67.7 ng/mL (ref 30.0–100.0)

## 2016-12-14 LAB — T4, FREE: FREE T4: 1.29 ng/dL (ref 0.82–1.77)

## 2016-12-14 LAB — LIPID PANEL
Chol/HDL Ratio: 4.4 ratio (ref 0.0–5.0)
Cholesterol, Total: 188 mg/dL (ref 100–199)
HDL: 43 mg/dL (ref >39)
LDL Calculated: 127 mg/dL — ABNORMAL HIGH (ref 0–99)
Triglycerides: 92 mg/dL (ref 0–149)
VLDL Cholesterol Cal: 18 mg/dL (ref 5–40)

## 2016-12-14 LAB — PHOSPHORUS: PHOSPHORUS: 3.1 mg/dL (ref 2.5–4.5)

## 2016-12-14 LAB — TSH: TSH: 3.81 u[IU]/mL (ref 0.450–4.500)

## 2016-12-24 ENCOUNTER — Telehealth: Payer: Self-pay | Admitting: Family Medicine

## 2016-12-24 NOTE — Telephone Encounter (Signed)
LVM requesting pt to call the office.  

## 2016-12-24 NOTE — Telephone Encounter (Signed)
Patient asking about update on botox that was discussed at last OV

## 2016-12-24 NOTE — Telephone Encounter (Signed)
Yes, please tell him I have emails out to a couple of specialists- [facility] and one [facility] that I haven't heard back from yet.    Please tell him I will f/up with them about it and let him know as soon I hear something.  Please encourage him to call these places also and inquire about such treatments.

## 2016-12-25 NOTE — Telephone Encounter (Signed)
MyChart message sent to pt with information.  T. Nelson, CMA  

## 2016-12-28 ENCOUNTER — Telehealth: Payer: Self-pay

## 2016-12-28 NOTE — Telephone Encounter (Signed)
Patient had questions about conversation about baclofen pump.  Dr Sharee Holsterpalski spoke with patient regarding this and other recommendations.  Referred patient to Dr Anne HahnWillis.

## 2016-12-31 ENCOUNTER — Telehealth: Payer: Self-pay | Admitting: Family Medicine

## 2016-12-31 DIAGNOSIS — R269 Unspecified abnormalities of gait and mobility: Secondary | ICD-10-CM

## 2016-12-31 DIAGNOSIS — G2581 Restless legs syndrome: Secondary | ICD-10-CM

## 2016-12-31 DIAGNOSIS — G114 Hereditary spastic paraplegia: Secondary | ICD-10-CM

## 2016-12-31 NOTE — Telephone Encounter (Signed)
I spoke with Dr Wynn BankerKirsteins about pt.  He had txed another pt with same condition and used botox injections which helped mobility.   He is willing to evaluate pt to see if he can help him.  --> please make pt aware that I am referring him to this doc for reasons above.  thanks

## 2017-01-01 ENCOUNTER — Telehealth: Payer: Self-pay | Admitting: Family Medicine

## 2017-01-01 NOTE — Telephone Encounter (Signed)
Pt called states someone from our office called and left a message for him-- Terry Howard pls call Terry Howard at [phone]. --glh

## 2017-01-01 NOTE — Telephone Encounter (Signed)
Left message informing patient of referral to Dr Wynn BankerKirsteins regarding botox injections.

## 2017-01-01 NOTE — Telephone Encounter (Signed)
Returned patients call regarding referral to Dr Wynn BankerKirsteins.  Advised patient they would contact him to schedule appointment.

## 2017-01-02 ENCOUNTER — Ambulatory Visit (INDEPENDENT_AMBULATORY_CARE_PROVIDER_SITE_OTHER): Payer: 59 | Admitting: Neurology

## 2017-01-02 ENCOUNTER — Encounter: Payer: Self-pay | Admitting: Neurology

## 2017-01-02 VITALS — BP 128/84 | HR 84 | Ht 66.75 in | Wt 149.5 lb

## 2017-01-02 DIAGNOSIS — M21371 Foot drop, right foot: Secondary | ICD-10-CM | POA: Diagnosis not present

## 2017-01-02 DIAGNOSIS — G114 Hereditary spastic paraplegia: Secondary | ICD-10-CM | POA: Diagnosis not present

## 2017-01-02 DIAGNOSIS — G2581 Restless legs syndrome: Secondary | ICD-10-CM

## 2017-01-02 DIAGNOSIS — R269 Unspecified abnormalities of gait and mobility: Secondary | ICD-10-CM

## 2017-01-02 DIAGNOSIS — M21372 Foot drop, left foot: Secondary | ICD-10-CM

## 2017-01-02 MED ORDER — BACLOFEN 10 MG PO TABS: 5.0000 mg | ORAL_TABLET | Freq: Three times a day (TID) | ORAL | 3 refills | Status: DC

## 2017-01-02 MED FILL — BACLOFEN 10 MG TABLET: 10 | 33 days supply | Qty: 50 | Fill #0

## 2017-01-02 NOTE — Progress Notes (Signed)
Reason for visit: Hereditary spastic paraparesis  Referring physician: Dr. Delle Reining Terry Howard is a 61 y.o. male  History of present illness:  Terry Howard is a 61 year old right-handed white male with a history of hereditary spastic paraparesis. The patient has had a chronic gait disorder that has gradually worsened over time. The patient has no problems with use of the arms, but the legs are somewhat weak and stiff. The patient also reports problems with restless leg syndrome. He takes Requip for this in the evening hours. The patient has noted that very small doses of Flexeril, one half or one quarter of a 5 mg tablet may help his spasticity in the legs. In the past is been on oral baclofen, but he is not on this medication now. The patient does note some urinary urgency and occasional incontinence. The has had an occasional fall, he uses a cane for ambulation. The patient will wear out a pair of shoes in one month by rubbing the toes of the shoes with walking. He does have some spasms in the legs in the evening hours at times. He denies any numbness of extremities. He is sent to this office for an evaluation.  Past Medical History:  Diagnosis Date  . Chronic insomnia   . Chronic low back pain    Annular tear at the L2-3 and L4-5 levels 4  . Gait disorder   . Hereditary spastic paraparesis (HCC)   . Hyperlipidemia   . 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 Neurogenic bladder   . RLS (restless legs syndrome)     Past Surgical History:  Procedure Laterality Date  . APPENDECTOMY    . BREAST SURGERY Bilateral    cysts  . COLONOSCOPY    . KNEE ARTHROSCOPY     right  . SHOULDER SURGERY     Rotator cuff, left  . ULNAR TUNNEL RELEASE Left 09/10/2014   Procedure: LEFT CUBITAL TUNNEL RELEASE;  Surgeon: [name], MD;  Location: [facility];  Service: Orthopedics;  Laterality: Left;    Family History  Problem Relation Age of Onset  . Cancer  Mother        breast cancer  . Other Mother   . Hyperlipidemia Mother   . Stroke Father        Intracranial hemorrhage  . Other Brother   . Other Brother   . Other Brother     Social history:  reports that he has never smoked. He has never used smokeless tobacco. He reports that he drinks about 4.2 oz of alcohol per week . He reports that he does not use drugs.  Medications:  Prior to Admission medications   Medication Sig Start Date End Date Taking? Authorizing Provider  acyclovir (ZOVIRAX) 800 MG tablet Take 1 tablet (800 mg total) by mouth daily. 07/26/16  Yes Opalski, Gavin Pound, DO  Cholecalciferol (VITAMIN D3) 5000 units TABS 5,000 IU OTC vitamin D3 daily. 06/21/16  Yes Opalski, Deborah, DO  Cinnamon 500 MG capsule Take 1 capsule by mouth daily.   Yes [provider]  cyclobenzaprine (FLEXERIL) 5 MG tablet Take 0.5 tablets (2.5 mg total) by mouth 3 (three) times daily as needed for muscle spasms. 12/13/16  Yes Opalski, Deborah, DO  Omega-3 Fatty Acids (FISH OIL) 1000 MG CAPS Take 1 capsule by mouth daily.   Yes [provider]  rOPINIRole (REQUIP) 2 MG tablet Take 0.5 tablets (1 mg total) by mouth daily. 12/13/16  Yes Opalski, Deborah, DO  Turmeric Curcumin 500 MG CAPS Take 1 capsule by mouth daily.   Yes [provider]      Allergies  Allergen Reactions  . Pregabalin   . Statins     Other reaction(s): Muscle Pain    ROS:  Out of a complete 14 system review of symptoms, the patient complains only of the following symptoms, and all other reviewed systems are negative.  Hearing loss Restless legs, insomnia, snoring Frequency of urination Back pain, walking difficulty Numbness, weakness  Blood pressure 128/84, pulse 84, height 5' 6.75" (1.695 m), weight 149 lb 8 oz (67.8 kg).  Physical Exam  General: The patient is alert and cooperative at the time of the examination.  Eyes: Pupils are equal, round, and reactive to light. Discs are flat  bilaterally.  Neck: The neck is supple, no carotid bruits are noted.  Respiratory: The respiratory examination is clear.  Cardiovascular: The cardiovascular examination reveals a regular rate and rhythm, no obvious murmurs or rubs are noted.  Skin: Extremities are without significant edema.  Neurologic Exam  Mental status: The patient is alert and oriented x 3 at the time of the examination. The patient has apparent normal recent and remote memory, with an apparently normal attention span and concentration ability.  Cranial nerves: Facial symmetry is present. There is good sensation of the face to pinprick and soft touch bilaterally. The strength of the facial muscles and the muscles to head turning and shoulder shrug are normal bilaterally. Speech is well enunciated, no aphasia or dysarthria is noted. Extraocular movements are full. Visual fields are full. The tongue is midline, and the patient has symmetric elevation of the soft palate. No obvious hearing deficits are noted.  Motor: The motor testing reveals 5 over 5 strength of the upper extremities. With the lower extremities, there is 4/5 strength with hip flexion and with dorsiflexion of the feet bilaterally, 4+/5 strength with knee flexion and extension bilaterally. Good symmetric motor tone is noted in arms, some increase in the legs.  Sensory: Sensory testing is intact to pinprick, soft touch, vibration sensation, and position sense on all 4 extremities. No evidence of extinction is noted.  Coordination: Cerebellar testing reveals good finger-nose-finger bilaterally. The patient has difficulty performing heel-to-shin bilaterally.  Gait and station: Gait is associated with a dipegic gait pattern. The patient drags the toes of the feet when taking a step. Tandem gait was not attempted.  Reflexes: Deep tendon reflexes are symmetric, but are elevated bilaterally. Toes are downgoing bilaterally.   Assessment/Plan:  1. Hereditary  spastic paraparesis  2. Gait disorder  3. Bilateral foot drops  4. Restless leg syndrome  The patient will be given a prescription for oral baclofen to take 5 mg 3 times daily. The patient appears to have a gait pattern that may be amenable to using bilateral AFO braces to help prevent dragging of the feet with walking. The patient could be a candidate for a baclofen pump in the future if he does not respond oral medications. The patient will be sent for physical therapy to evaluate his walking and to make recommendations concerning the type of AFO brace to use. He will follow-up in 5 months.  Marlan Palau. Keith Trinty Marken MD 01/02/2017 11:06 AM  [facility] [address] [place], [place] [id number]  Phone [phone] Fax [phone]

## 2017-01-09 ENCOUNTER — Encounter: Payer: Self-pay | Admitting: Physical Therapy

## 2017-01-09 ENCOUNTER — Ambulatory Visit: Payer: 59 | Attending: Neurology | Admitting: Physical Therapy

## 2017-01-09 DIAGNOSIS — R2681 Unsteadiness on feet: Secondary | ICD-10-CM | POA: Diagnosis not present

## 2017-01-09 DIAGNOSIS — R296 Repeated falls: Secondary | ICD-10-CM | POA: Diagnosis not present

## 2017-01-09 DIAGNOSIS — M6281 Muscle weakness (generalized): Secondary | ICD-10-CM | POA: Diagnosis not present

## 2017-01-09 DIAGNOSIS — R2689 Other abnormalities of gait and mobility: Secondary | ICD-10-CM | POA: Diagnosis not present

## 2017-01-09 NOTE — Therapy (Signed)
[facility] [address] [place], [place], [address] Phone: [phone]   Fax:  [phone]  Physical Therapy Evaluation  Patient Details  Name: Terry Howard MRN: [id number] Date of Birth: Sep 08, 1955 Referring Provider: York Spaniel MD   Encounter Date: 01/09/2017      PT End of Session - 01/09/17 1344    Visit Number 1   Number of Visits 9   Date for PT Re-Evaluation 03/08/17   Authorization Type [facility]    PT Start Time 1100   PT Stop Time 1145   PT Time Calculation (min) 45 min   Activity Tolerance Patient tolerated treatment well   Behavior During Therapy [facility] for tasks assessed/performed      Past Medical History:  Diagnosis Date  . Chronic insomnia   . Chronic low back pain    Annular tear at the L2-3 and L4-5 levels 4  . Gait disorder   . Hereditary spastic paraparesis (HCC)   . Hyperlipidemia   . 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 Neurogenic bladder   . RLS (restless legs syndrome)     Past Surgical History:  Procedure Laterality Date  . APPENDECTOMY    . BREAST SURGERY Bilateral    cysts  . COLONOSCOPY    . KNEE ARTHROSCOPY     right  . SHOULDER SURGERY     Rotator cuff, left  . ULNAR TUNNEL RELEASE Left 09/10/2014   Procedure: LEFT CUBITAL TUNNEL RELEASE;  Surgeon: [name], MD;  Location: [facility];  Service: Orthopedics;  Laterality: Left;    There were no vitals filed for this visit.       Subjective Assessment - 01/09/17 1107    Subjective Patient is a 61 year old male presenting to OPPT due to hereditary spastic paraparesis creating gait instability. Patient ambulates into PT with cane, which he has been using for 8 years. Patient reports onset of spasticity and LE weakness was approximately 20 years ago, but he reports a more severe decline/progression has been occurring over the past couple of years. Patient reports his brothers  have the same hereditary spastic paraparesis, and they are in wheelchairs. Patient reports he avoids spending time in the living room because he is unable to transfer from low/soft or inclined surfaces such as his recliner; therefore, he reports he often spends time in bed.    Pertinent History chronic insomnia, chronic low back pain, hereditary spastic paraparesis, hyperlipidemia, mild carpal tunnel syndrome of R wrist, neurogenic bladder, restless legs syndrome, R TKA, L RTC surgery    Limitations Lifting;Standing;Walking;House hold activities   Patient Stated Goals increase balance, explore brace/orthotic options to aid in gait    Currently in Pain? Yes   Pain Score 5   worse = 9/10; least = 2/10   Pain Location Leg  restless leg syndrome pain    Pain Orientation Right;Left   Pain Descriptors / Indicators Spasm   Pain Type Chronic pain   Pain Onset More than a month ago   Pain Frequency Constant   Aggravating Factors  lying down    Pain Relieving Factors icy hot; medication; sitting upright    Multiple Pain Sites Yes   Pain Score 1  worse = 6/10; least = 1/10   Pain Location Back   Pain Orientation Mid;Lower   Pain Descriptors / Indicators Aching   Pain Type Chronic pain   Pain Onset More than a month  ago   Pain Frequency Constant   Aggravating Factors  maintaining a position for too long, long distance walking    Pain Relieving Factors resting; lying down (but this aggravates leg pain)             [facility] PT Assessment - 01/09/17 1100      Assessment   Medical Diagnosis hereditary spastic paraparesis and gait instability   Referring Provider York Spanielharles K Delance Weide MD    Onset Date/Surgical Date 01/02/17  PT referral    Hand Dominance Right     Precautions   Precautions Fall     Balance Screen   Has the patient fallen in the past 6 months Yes   How many times? 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 Patient reports injuries associated with falls  are bruises and cuts.   Has the patient had a decrease in activity level because of a fear of falling?  No   Is the patient reluctant to leave their home because of a fear of falling?  No     Home Environment   Living Environment Private residence   Living Arrangements Spouse/significant other;Children  son - 61 years old    Type of Home House   Home Access Stairs to enter   Entrance Stairs-Number of Steps 7   Entrance Stairs-Rails Right   Home Layout Two level  bed and bath on main level    Alternate Level Stairs-Number of Steps 13   Alternate Level Stairs-Rails Left   Home Equipment Walker - 4 wheels;Wheelchair - power;Cane - single point;Shower seat     Prior Function   Level of Independence Independent with household mobility with device;Independent with community mobility with device  reliant on cane for approximately 8 weeks    Pharmacist, communityVocation Requirements lawn care service    Leisure exercising; yard work      Observation/Other Assessments   Focus on Therapeutic Outcomes (FOTO)  29 Functional Status   Activities of Balance Confidence Scale (ABC Scale)  21.3%   Fear Avoidance Belief Questionnaire (FABQ)  19 (5)     Posture/Postural Control   Posture/Postural Control --     Tone   Assessment Location Right Lower Extremity;Left Lower Extremity     ROM / Strength   AROM / PROM / Strength Strength;PROM     PROM   Overall PROM  Deficits   PROM Assessment Site Hip;Knee;Ankle   Right/Left Hip Right;Left   Right/Left Knee Right;Left   Right Knee Extension -11   Left Knee Extension -14   Right/Left Ankle Right;Left   Right Ankle Dorsiflexion 11   Left Ankle Dorsiflexion 14     Strength   Overall Strength Deficits   Strength Assessment Site Knee;Hip;Ankle   Right/Left Hip Right;Left   Right Hip Flexion 3-/5   Right Hip Extension 3-/5   Right Hip ABduction 3-/5   Left Hip Flexion 3/5   Left Hip Extension 3/5   Left Hip ABduction 3-/5   Right/Left Knee Right;Left    Right Knee Flexion 3/5   Right Knee Extension 3-/5   Left Knee Flexion 3/5   Left Knee Extension 3-/5   Right/Left Ankle Right;Left   Right Ankle Dorsiflexion 4-/5   Right Ankle Plantar Flexion 3-/5   Left Ankle Dorsiflexion 3+/5   Left Ankle Plantar Flexion 3-/5     Transfers   Transfers Sit to Stand;Stand to Sit   Sit to Stand 5: Supervision;With upper extremity assist;From chair/3-in-1  uses back of  legs against mat table or chair to stabilize   Stand to Sit 5: Supervision;With upper extremity assist;To chair/3-in-1;Uncontrolled descent  uses back of legs to control or uncontrolled descent     Ambulation/Gait   Ambulation/Gait Yes   Ambulation/Gait Assistance 4: Min guard   Ambulation Distance (Feet) 60 Feet  60' X 2   Assistive device Straight cane;Other (Comment)  trial Bil. AFOs   Gait Pattern Step-through pattern;Decreased arm swing - left;Decreased dorsiflexion - right;Decreased dorsiflexion - left;Right flexed knee in stance;Left flexed knee in stance;Poor foot clearance - left;Poor foot clearance - right;Decreased step length - right;Decreased step length - left;Decreased stride length   Ambulation Surface Level;Indoor   Gait velocity 1.57ft/s  1.59ft/s with cane; 0.64ft/s with cane and bilateral AFOs   Gait Comments PT assessed patient's gait velocity first without B walk on reaction AFOs, and then with B walk on reaction AFOs. Patient reported he felt fatigued when gait velocity was measured with B AFOs donned. Qualitatively, PT noted increased foot clearance with B AFOs donned even though patient's gait velocity decreased.      Standardized Balance Assessment   Standardized Balance Assessment Berg Balance Test     Berg Balance Test   Sit to Stand Able to stand  independently using hands   Standing Unsupported Able to stand 2 minutes with supervision   Sitting with Back Unsupported but Feet Supported on Floor or Stool Able to sit safely and securely 2 minutes   Stand  to Sit Uses backs of legs against chair to control descent   Transfers Able to transfer with verbal cueing and /or supervision   Standing Unsupported with Eyes Closed Able to stand 10 seconds with supervision   Standing Ubsupported with Feet Together Needs help to attain position but able to stand for 30 seconds with feet together   From Standing, Reach Forward with Outstretched Arm Can reach forward >12 cm safely (5")   From Standing Position, Pick up Object from Floor Unable to try/needs assist to keep balance   From Standing Position, Turn to Look Behind Over each Shoulder Turn sideways only but maintains balance   Turn 360 Degrees Needs assistance while turning   Standing Unsupported, Alternately Place Feet on Step/Stool Needs assistance to keep from falling or unable to try   Standing Unsupported, One Foot in [employer] balance while stepping or standing   Standing on One Leg Unable to try or needs assist to prevent fall   Total Score 23     RLE Tone   RLE Tone Moderate  hamstring spasticity      LLE Tone   LLE Tone Moderate  hamstring spasticity             Objective measurements completed on examination: See above findings.                  PT Education - 01/09/17 1343    Education provided Yes   Education Details plan of care    Person(s) Educated Patient   Methods Explanation   Comprehension Verbalized understanding          PT Short Term Goals - 01/09/17 1504      PT SHORT TERM GOAL #1   Title Patient will verbalize understanding and return demonstration of initial HEP for LE strengthening, flexibility, and balance. (TARGET DATE: 02/08/2017)    Time 4   Period Weeks   Status New     PT SHORT TERM GOAL #2   Title Patient  will demonstrate ability to safely ambulate 250 feet on indoor surfaces with cane and B AFOs and supervision to indicate a decrease in risk of falling. (TARGET DATE: 02/08/2017)     Time 4   Period Weeks   Status New     PT  SHORT TERM GOAL #3   Title Patient will demonstrate ability to safely ambulate over ramp/curb and stairs (2 rails) with cane and B AFOs and supervision to demonstrate a decrease in risk of falling. (TARGET DATE: 02/08/2017)   Time 4   Period Weeks   Status New     PT SHORT TERM GOAL #4   Title Patient will verbalize understanding of B AFO recommendation. (TARGET DATE: 02/08/2017)    Time 4   Period Weeks   Status New           PT Long Term Goals - 01/09/17 1352      PT LONG TERM GOAL #1   Title Patient will verbalize understanding and return demonstration for ongoing HEP for LE strengthening, flexibility, and balance. (TARGET DATE: 03/08/2017)    Time 8   Period Weeks   Status New     PT LONG TERM GOAL #2   Title Patient will demonstrate ability to ambulate >500 feet including outdoor surfaces with cane and B AFOs modified independent to indicate a decrease in risk of falling when returning to full community ambulation and lawn care. (TARGET DATE: 03/08/2017)    Time 8   Period Weeks   Status New     PT LONG TERM GOAL #3   Title Patient will demonstrate ability to safely ambulate over ramp/curb/stairs (1 rail) with cane and B AFOs modified independent to indicate a decrease in risk of falling when returning to full community ambulation. (TARGET DATE: 03/08/2017)    Time 8   Period Weeks   Status New     PT LONG TERM GOAL #4   Title Patient will be able to independnetly donne and doffe B AFOs and verbalize understanding of proper wear schedule and care of B AFOs.    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 (TARGET DATE: 03/08/2017)     Time 8   Period Weeks   Status New     Additional Long Term Goals   Additional Long Term Goals Yes      PT LONG TERM GOAL #6   Title Patient self reports >10% improvement in Activities of Balance Confidence using FOTO. Target Date: 03/08/2017    Time 8   Period Weeks   Status New     PT LONG TERM GOAL #7   Title --   Time --   Period --   Status --                Plan - 01/09/17 1350    Clinical Impression Statement Patient is a 61 year old male presenting to OPPT neuro with an evolving clinical presentation for a moderate complexity PT evaluation. The following deficits were noted during the patient's exam: gait mechanic abnormality with poor foot clearance bilaterally and reliance on UE support to maintain balance indicating patient at an increased risk for falling; bilateral lower extremity weakness placing the patient at  an increased risk for falling; increased tone in bilateral lower extremities placing patient at an increased risk for falling; and decreased balance indicating an increased risk for falling, and subjective reports of falling 1-2 times per day & multiple near falls using UEs to prevent per day indicating the patient is at a very high risk for continued repeated falls.  Patient has holes on anterior lateral aspect of shoes due to dragging with poor clearance and he reports he goes thru shoes every 3-4 weeks. Patient appears would benefit from bilateral AFOs to improve gait clearance & stability. The patient's gait velocity of 1.76ft/s and Berg Balance Test score of 24/56 indicate he is at an increased risk for repeated falls. Patient would benefit from skilled PT to address these functional limitations and to assess the need/potential benefit for orthotics to improve foot clearance to maximize functional mobility independence and reduce falls risk.    History and Personal Factors relevant to plan of care: chronic insomnia, chronic low back pain, hereditary spastic paraparesis, hyperlipidemia, mild carpal tunnel syndrome of R wrist, neurogenic bladder, restless legs syndrome, R  TKA, L RTC surgery    Clinical Presentation Evolving   Clinical Presentation due to: progressive hereditary spastic paraparesis, repeated falls    Clinical Decision Making Moderate   Rehab Potential Good   PT Frequency 1x / week   PT Duration 8 weeks   PT Treatment/Interventions ADLs/Self Care Home Management;Neuromuscular re-education;Balance training;Therapeutic exercise;Therapeutic activities;Functional mobility training;Stair training;Gait training;DME Instruction;Patient/family education;Orthotic Fit/Training;Energy conservation   PT Next Visit Plan assess gait with bilateral AFO (medial strut and anterior plate to foster extension);    Recommended Other Services Bilateral AFOs   Consulted and Agree with Plan of Care Patient      Patient will benefit from skilled therapeutic intervention in order to improve the following deficits and impairments:  Abnormal gait, Decreased activity tolerance, Decreased balance, Decreased range of motion, Decreased mobility, Decreased knowledge of use of DME, Decreased knowledge of precautions, Decreased endurance, Decreased strength, Difficulty walking, Impaired tone, Pain (PT will plan to address patient's pain via functional mobility training )  Visit Diagnosis: Unsteadiness on feet  Other abnormalities of gait and mobility  Muscle weakness (generalized)  Repeated falls     Problem List Patient Active Problem List   Diagnosis Date Noted  . Statin intolerance 12/13/2016  . Vitamin D insufficiency 06/21/2016  . Elevated TSH 06/21/2016  . External hemorrhoids 05/18/2016  . Benign prostatic hyperplasia  L>R; without lower urinary tract symptoms 05/18/2016  . Health education/counseling 02/25/2016  . Restless legs syndrome (RLS) 02/15/2016  . Erectile dysfunction 02/15/2016  . h/o CLinically Signficant Anxiety 01/05/2014  . h/o Clinical depression 12/31/2013  . HLD (hyperlipidemia) 12/18/2013  . Genital Herpes 11/18/2013  . Cannot sleep  11/18/2013  . Extremity pain 11/18/2013  . Abnormal findings on diagnostic imaging of abdomen 10/29/2013  . 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 Polypharmacy 08/18/2013  . Other long term (current) drug therapy 08/18/2013  . Chronic pain 08/11/2013  . Other specified disorders of urinary tract 07/09/2012  . Abnormality of gait 07/09/2012  . Lumbago 07/09/2012  . Disturbance of skin sensation 07/09/2012  . 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

## 2017-01-14 ENCOUNTER — Ambulatory Visit: Payer: 59 | Admitting: Physical Therapy

## 2017-01-14 ENCOUNTER — Encounter: Payer: Self-pay | Admitting: Physical Therapy

## 2017-01-14 DIAGNOSIS — R296 Repeated falls: Secondary | ICD-10-CM | POA: Diagnosis not present

## 2017-01-14 DIAGNOSIS — R2689 Other abnormalities of gait and mobility: Secondary | ICD-10-CM | POA: Diagnosis not present

## 2017-01-14 DIAGNOSIS — R2681 Unsteadiness on feet: Secondary | ICD-10-CM

## 2017-01-14 DIAGNOSIS — M6281 Muscle weakness (generalized): Secondary | ICD-10-CM

## 2017-01-14 NOTE — Patient Instructions (Addendum)
Functional Quadriceps: Sit to Stand    Sit on edge of chair, feet flat on floor. Stand upright, extending knees fully. Use arms as needed. Repeat _10_ times per set. Do _1_ sets per session. Do _1-2_ sessions per day.  http://orth.exer.us/734   Copyright  VHI. All rights reserved.   Heel Raises    Stand with support (kitchen counter). With knees straight, raise heels off ground. Hold __5_ seconds. Then slowly lower heels back to floor. Repeat _10_ times. Do 1-2_ times a day.  Copyright  VHI. All rights reserved.   Single Leg - Eyes Open    Holding support, lift right leg while standing on left leg. Progress to removing hands from support surface for longer periods of time. Hold___10_ seconds. Repeat with standing on right leg while left leg is lifted up. Repeat __3_ times each leg. Do _1-2_ sessions per day.  Copyright  VHI. All rights reserved.

## 2017-01-14 NOTE — Therapy (Signed)
[facility] [address] [place], [place], [address] Phone: [phone]   Fax:  [phone]  Physical Therapy Treatment  Patient Details  Name: Terry Howard MRN: [id number] Date of Birth: [birth year] Referring Provider: York Spaniel MD   Encounter Date: 01/14/2017      PT End of Session - 01/14/17 0850    Visit Number 2   Number of Visits 9   Date for PT Re-Evaluation 03/08/17   Authorization Type [facility]    PT Start Time [phone]   PT Stop Time 0930   PT Time Calculation (min) 42 min   Equipment Utilized During Treatment Gait belt   Activity Tolerance Patient tolerated treatment well   Behavior During Therapy [facility] for tasks assessed/performed      Past Medical History:  Diagnosis Date  . Chronic insomnia   . Chronic low back pain    Annular tear at the L2-3 and L4-5 levels 4  . Gait disorder   . Hereditary spastic paraparesis (HCC)   . Hyperlipidemia   . 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 Neurogenic bladder   . RLS (restless legs syndrome)     Past Surgical History:  Procedure Laterality Date  . APPENDECTOMY    . BREAST SURGERY Bilateral    cysts  . COLONOSCOPY    . KNEE ARTHROSCOPY     right  . SHOULDER SURGERY     Rotator cuff, left  . ULNAR TUNNEL RELEASE Left 09/10/2014   Procedure: LEFT CUBITAL TUNNEL RELEASE;  Surgeon: [name], MD;  Location: [facility];  Service: Orthopedics;  Laterality: Left;    There were no vitals filed for this visit.      Subjective Assessment - 01/14/17 0849    Subjective No new complaints. Reports several trips with a few falls, about 3-4. Reports his only injuries are cuts/bruises.    Pertinent History chronic insomnia, chronic low back pain, hereditary spastic paraparesis, hyperlipidemia, mild carpal tunnel syndrome of R wrist, neurogenic bladder, restless legs syndrome, R TKA, L RTC surgery    Limitations  Lifting;Standing;Walking;House hold activities   Patient Stated Goals increase balance, explore brace/orthotic options to aid in gait    Currently in Pain? Yes   Pain Score 5    Pain Location Leg   Pain Orientation Right;Left   Pain Descriptors / Indicators Spasm   Pain Type Chronic pain   Pain Onset More than a month ago   Pain Frequency Constant   Aggravating Factors  lying down   Pain Relieving Factors icy hot, medication, sitting upright             [facility] Adult PT Treatment/Exercise - 01/14/17 0857      Transfers   Transfers Sit to Stand;Stand to Sit   Sit to Stand 5: Supervision;With upper extremity assist;From chair/3-in-1   Stand to Sit 5: Supervision;With upper extremity assist;To chair/3-in-1;Uncontrolled descent     Ambulation/Gait   Ambulation/Gait Yes   Ambulation/Gait Assistance 4: Min guard;5: Supervision   Ambulation/Gait Assistance Details gait into clinic and short distances without orthotics. Pt has limited hip and knee movements with gait and locks out knees for stability. Any bracing that correct this will most likely throw his balance and gait. On discussion with orthotist the best option based on pt's gait and medical diagnosis is for bil leather toe caps.  Ambulation Distance (Feet) 100 Feet  x1, 50 x1; 115 x1   Assistive device Straight cane   Gait Pattern Step-through pattern;Decreased arm swing - left;Decreased dorsiflexion - right;Decreased dorsiflexion - left;Right flexed knee in stance;Left flexed knee in stance;Poor foot clearance - left;Poor foot clearance - right;Decreased step length - right;Decreased step length - left;Decreased stride length;Decreased hip/knee flexion - right;Decreased hip/knee flexion - left;Narrow base of support   Ambulation Surface Level;Outdoor   Gait Comments see in conjuction with Trey Paula from [place] [facility] for orthotic consult.     issued the following to pt's HEP: Functional Quadriceps: Sit to  Stand    Sit on edge of chair, feet flat on floor. Stand upright, extending knees fully. Use arms as needed. Repeat _10_ times per set. Do _1_ sets per session. Do _1-2_ sessions per day.  http://orth.exer.us/734   Copyright  VHI. All rights reserved.   Heel Raises    Stand with support (kitchen counter). With knees straight, raise heels off ground. Hold __5_ seconds. Then slowly lower heels back to floor. Repeat _10_ times. Do 1-2_ times a day.  Copyright  VHI. All rights reserved.   Single Leg - Eyes Open    Holding support, lift right leg while standing on left leg. Progress to removing hands from support surface for longer periods of time. Hold___10_ seconds. Repeat with standing on right leg while left leg is lifted up. Repeat __3_ times each leg. Do _1-2_ sessions per day.  Copyright  VHI. All rights reserved.         PT Education - 01/14/17 0926    Education provided Yes   Education Details orthotic consult with Trey Paula from [place]; HEP for strengthening   Person(s) Educated Patient   Methods Explanation;Demonstration;Verbal cues;Handout   Comprehension Verbalized understanding;Returned demonstration;Verbal cues required;Need further instruction          PT Short Term Goals - 01/09/17 1504      PT SHORT TERM GOAL #1   Title Patient will verbalize understanding and return demonstration of initial HEP for LE strengthening, flexibility, and balance. (TARGET DATE: 02/08/2017)    Time 4   Period Weeks   Status New     PT SHORT TERM GOAL #2   Title Patient will demonstrate ability to safely ambulate 250 feet on indoor surfaces with cane and B AFOs and supervision to indicate a decrease in risk of falling. (TARGET DATE: 02/08/2017)     Time 4   Period Weeks   Status New     PT SHORT TERM GOAL #3   Title Patient will demonstrate ability to safely ambulate over ramp/curb and stairs (2 rails) with cane and B AFOs and supervision to demonstrate a decrease in risk  of falling. (TARGET DATE: 02/08/2017)   Time 4   Period Weeks   Status New     PT SHORT TERM GOAL #4   Title Patient will verbalize understanding of B AFO recommendation. (TARGET DATE: 02/08/2017)    Time 4   Period Weeks   Status New           PT Long Term Goals - 01/09/17 1352      PT LONG TERM GOAL #1   Title Patient will verbalize understanding and return demonstration for ongoing HEP for LE strengthening, flexibility, and balance. 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 (TARGET DATE: 03/08/2017)    Time 8   Period Weeks   Status New     PT LONG TERM GOAL #3   Title Patient will demonstrate ability to safely ambulate over ramp/curb/stairs (1 rail) with cane and B AFOs modified independent to indicate a decrease in risk of falling when returning to full community ambulation. (TARGET DATE: 03/08/2017)    Time 8   Period Weeks   Status New     PT LONG TERM GOAL #4   Title Patient will be able to independnetly donne and doffe B AFOs and verbalize understanding of proper wear schedule and care of B AFOs.    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 (TARGET DATE: 03/08/2017)     Time 8   Period Weeks   Status New     Additional Long Term Goals   Additional Long Term Goals Yes     PT LONG TERM GOAL #6   Title Patient self reports >10% improvement in Activities of Balance Confidence using FOTO. Target Date: 03/08/2017    Time 8   Period Weeks   Status New      PT LONG TERM GOAL #7   Title --   Time --   Period --   Status --            Plan - 01/14/17 0851    Clinical Impression Statement Today's skilled session initially focused on orthotic consult with orthoitist from [facility]. Was decided between pt and orthotist that bil leather toe caps would be best options. Pt is to drop of his new shoes at [employer] this week. Remainder of session addressed initiation of HEP for LE strengthening. Pt is making steady progress toward goals and should benefit from continued PT to progress toward unmet goals.                        Rehab Potential Good   PT Frequency 1x / week   PT Duration 8 weeks   PT Treatment/Interventions ADLs/Self Care Home Management;Neuromuscular re-education;Balance training;Therapeutic exercise;Therapeutic activities;Functional mobility training;Stair training;Gait training;DME Instruction;Patient/family education;Orthotic Fit/Training;Energy conservation   PT Next Visit Plan continue to work on LE strengthening and flexibility   Consulted and Agree with Plan of Care Patient      Patient will benefit from skilled therapeutic intervention in order to improve the following deficits and impairments:  Abnormal gait, Decreased activity tolerance, Decreased balance, Decreased range of motion, Decreased mobility, Decreased knowledge of use of DME, Decreased knowledge of precautions, Decreased endurance, Decreased strength, Difficulty walking, Impaired tone, Pain (PT will plan to address patient's pain via functional mobility training )  Visit Diagnosis: Unsteadiness on feet  Other abnormalities of gait and mobility  Muscle weakness (generalized)  Repeated falls     Problem List Patient Active Problem List   Diagnosis Date Noted  . Statin intolerance 12/13/2016  . Vitamin D insufficiency 06/21/2016  . Elevated TSH 06/21/2016  . External hemorrhoids 05/18/2016  . Benign prostatic hyperplasia  L>R; without lower  urinary  tract symptoms 05/18/2016  . Health education/counseling 02/25/2016  . Restless legs syndrome (RLS) 02/15/2016  . Erectile dysfunction 02/15/2016  . h/o CLinically Signficant Anxiety 01/05/2014  . h/o Clinical depression 12/31/2013  . HLD (hyperlipidemia) 12/18/2013  . Genital Herpes 11/18/2013  . Cannot sleep 11/18/2013  . Extremity pain 11/18/2013  . Abnormal findings on diagnostic imaging of abdomen 10/29/2013  . 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 Polypharmacy 08/18/2013  . Other long term (current) drug therapy 08/18/2013  . Chronic pain 08/11/2013  . Other specified disorders of urinary tract 07/09/2012  . Abnormality of gait 07/09/2012  . Lumbago 07/09/2012  . Disturbance of skin sensation 07/09/2012  . 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

## 2017-01-23 ENCOUNTER — Ambulatory Visit: Payer: 59 | Admitting: Physical Therapy

## 2017-01-23 ENCOUNTER — Encounter: Payer: Self-pay | Admitting: Physical Therapy

## 2017-01-23 DIAGNOSIS — M6281 Muscle weakness (generalized): Secondary | ICD-10-CM | POA: Diagnosis not present

## 2017-01-23 DIAGNOSIS — R2681 Unsteadiness on feet: Secondary | ICD-10-CM | POA: Diagnosis not present

## 2017-01-23 DIAGNOSIS — R296 Repeated falls: Secondary | ICD-10-CM

## 2017-01-23 DIAGNOSIS — R2689 Other abnormalities of gait and mobility: Secondary | ICD-10-CM

## 2017-01-23 NOTE — Therapy (Addendum)
PT verbally authorized changing goals from AFO to shoe modifications as noted below. Vladimir Faster, PT, DPT PT Specializing in Prosthetics & Orthotics 01/24/17 5:02 PM Phone:  [phone]  Fax:  [phone] [facility] [address] [place], [place] [address]    [facility] [address] [place], [place], [address] Phone: [phone]   Fax:  [phone]  Physical Therapy Treatment  Patient Details  Name: Terry Howard MRN: [id number] Date of Birth: 1956-06-13 Referring Provider: York Spaniel MD   Encounter Date: 01/23/2017      PT End of Session - 01/23/17 1113    Visit Number 3   Number of Visits 9   Date for PT Re-Evaluation 03/08/17   Authorization Type [facility]    PT Start Time 0803   PT Stop Time 0845   PT Time Calculation (min) 42 min   Equipment Utilized During Treatment Gait belt   Activity Tolerance Patient tolerated treatment well   Behavior During Therapy [facility] for tasks assessed/performed      Past Medical History:  Diagnosis Date  . Chronic insomnia   . Chronic low back pain    Annular tear at the L2-3 and L4-5 levels 4  . Gait disorder   . Hereditary spastic paraparesis (HCC)   . Hyperlipidemia   . 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 Neurogenic bladder   . RLS (restless legs syndrome)     Past Surgical History:  Procedure Laterality Date  . APPENDECTOMY    . BREAST SURGERY Bilateral    cysts  . COLONOSCOPY    . KNEE ARTHROSCOPY     right  . SHOULDER SURGERY     Rotator cuff, left  . ULNAR TUNNEL RELEASE Left 09/10/2014   Procedure: LEFT CUBITAL TUNNEL RELEASE;  Surgeon: [name], MD;  Location: [facility];  Service: Orthopedics;  Laterality: Left;    There were no vitals filed for this visit.      Subjective Assessment - 01/23/17 0806    Subjective Patient presents to PT with B leather toe caps on  shoes. He received his B leather toe caps yesterday. Patient reports today is the first time he is wearing his B leather toe caps. Patient reports several trips/falls when moving objects in/out of car. Patient reports mild pain in medial R thigh (adductors) from one of his falls over the weekend (3-4 days ago). Denies any other pain or injuries from falls. Patient reports compliance with HEP without any trouble.   Currently in Pain? No/denies  mild discomfort in R leg              [facility] Adult PT Treatment/Exercise - 01/23/17 0800      Transfers   Transfers Sit to Stand;Stand to Sit   Sit to Stand 5: Supervision;With upper extremity assist;From chair/3-in-1   Stand to Sit 5: Supervision;With upper extremity assist;To chair/3-in-1;Uncontrolled descent     Ambulation/Gait   Ambulation/Gait Yes   Ambulation/Gait Assistance 5: Supervision;4: Min guard;4: Min assist   Ambulation/Gait Assistance Details Required S at beginning of walk, then transitioned to needing min guard with intermittent min A due to onset of fatigue. Required cueing for foot clearance as he fatigued.   Ambulation Distance (Feet) 273 Feet  246ft & multiple short walks between activities in clinic    Assistive device Straight cane  B leather toe caps    Gait Pattern Step-through pattern;Decreased  arm swing - left;Decreased dorsiflexion - right;Decreased dorsiflexion - left;Right flexed knee in stance;Left flexed knee in stance;Poor foot clearance - left;Poor foot clearance - right;Decreased step length - right;Decreased step length - left;Decreased stride length;Decreased hip/knee flexion - right;Decreased hip/knee flexion - left;Narrow base of support   Ambulation Surface Level;Indoor;Other (comment)  compliant surface (mat)    Stairs Yes   Stairs Assistance 5: Supervision;4: Min guard   Stairs Assistance Details (indicate cue type and reason) Requires cueing for foot placement to decreaes forward slip when descending  stairs especially now with B leather toe caps. Required min guard on second set of 4 steps.   Stair Management Technique One rail Right;Alternating pattern;Forwards   Number of Stairs 4  x2   Height of Stairs 6   Ramp Other (comment)  min guard    Ramp Details (indicate cue type and reason) requires cueing for weight shift and foot clearance    Curb Other (comment)  min guard    Curb Details (indicate cue type and reason) requires cueing for sequencing with cane    Gait Comments PT instructed patient in performing head turns/nods in hallway (multiple laps) requiring min guard with intermittent min A due to fatigue and misstep when completing head motion. Progressed to ambulation on compliant surface (red mats) requiring min guard and cueing for foot clearance and placement especially when transitioning between firm surface and red mat. 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            PT Short Term Goals - 01/23/17 1113      PT SHORT TERM GOAL #1   Title Patient will verbalize understanding and return demonstration of initial HEP for LE strengthening, flexibility, and balance. (TARGET DATE: 02/08/2017)    Time 4   Period Weeks   Status New     PT SHORT TERM GOAL #2   Title Patient will demonstrate ability to safely ambulate 250 feet on indoor surfaces with cane and shoe modification and supervision to indicate a decrease in risk of falling. (TARGET DATE: 02/08/2017)     Time 4   Period Weeks   Status Revised     PT SHORT TERM GOAL #3   Title Patient will demonstrate ability to safely ambulate over ramp/curb and stairs (2 rails) with cane and shoe modification and supervision to demonstrate a decrease in risk of falling. (TARGET DATE: 02/08/2017)   Time 4   Period Weeks   Status Revised     PT SHORT TERM GOAL #4   Title Patient will verbalize understanding of shoe modification  recommendation. (TARGET DATE: 02/08/2017)    Time 4   Period Weeks   Status Revised           PT Long Term Goals - 01/23/17 1114      PT LONG TERM GOAL #1   Title Patient will verbalize understanding and return demonstration for ongoing HEP for LE strengthening, flexibility, and balance. (TARGET DATE: 03/08/2017)    Time 8   Period Weeks   Status New     PT LONG TERM GOAL #2   Title Patient will demonstrate ability to ambulate >500 feet including outdoor surfaces with cane and shoe modification modified independent to indicate a decrease in risk of falling when returning to full community ambulation and lawn care. (TARGET DATE: 03/08/2017)    Time 8   Period Weeks  Status Revised     PT LONG TERM GOAL #3   Title Patient will demonstrate ability to safely ambulate over ramp/curb/stairs (1 rail) with cane and shoe modification modified independent to indicate a decrease in risk of falling when returning to full community ambulation. (TARGET DATE: 03/08/2017)    Time 8   Period Weeks   Status Revised     PT LONG TERM GOAL #4   Title Patient will be able to independnetly donne and doffe B AFOs and verbalize understanding of proper wear schedule and care of B AFOs.    Baseline Patient is able to independently don and doff shoes with B leather toe caps. Patient is not utilizing B AFOs.    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 (TARGET DATE: 03/08/2017)     Time 8   Period Weeks   Status Revised     PT LONG TERM GOAL #6   Title Patient self reports >10% improvement in Activities of Balance Confidence using FOTO. Target Date: 03/08/2017    Time 8   Period Weeks   Status New            Plan - 01/23/17 1120    Clinical  Impression Statement Today's skilled PT session focused on gait training including turning, compliant surfaces (red mat), over simulated mole holes (due to patient's report of trouble ambulating in yards with bumps/holes), ramp/curb, and stairs with new B leather toe caps. PT has revised all short and long term goals to reflect patient's use of new shoe modification instead of B AFOs. Patient is making progress towards goals, and will benefit from continued skilled PT to address functional mobility deficits.     Rehab Potential Good   PT Frequency 1x / week   PT Duration 8 weeks   PT Treatment/Interventions ADLs/Self Care Home Management;Neuromuscular re-education;Balance training;Therapeutic exercise;Therapeutic activities;Functional mobility training;Stair training;Gait training;DME Instruction;Patient/family education;Orthotic Fit/Training;Energy conservation   PT Next Visit Plan continue to work on LE strengthening and flexibility   Consulted and Agree with Plan of Care Patient      Patient will benefit from skilled therapeutic intervention in order to improve the following deficits and impairments:  Abnormal gait, Decreased activity tolerance, Decreased balance, Decreased range of motion, Decreased mobility, Decreased knowledge of use of DME, Decreased knowledge of precautions, Decreased endurance, Decreased strength, Difficulty walking, Impaired tone, Pain (PT will plan to address patient's pain via functional mobility training )  Visit Diagnosis: Unsteadiness on feet  Other abnormalities of gait and mobility  Muscle weakness (generalized)  Repeated falls     Problem List Patient Active Problem List   Diagnosis Date Noted  . Statin intolerance 12/13/2016  . Vitamin D insufficiency 06/21/2016  . Elevated TSH 06/21/2016  . External hemorrhoids 05/18/2016  . Benign prostatic hyperplasia  L>R; without lower urinary tract symptoms 05/18/2016  . Health education/counseling 02/25/2016   . Restless legs syndrome (RLS) 02/15/2016  . Erectile dysfunction 02/15/2016  . h/o CLinically Signficant Anxiety 01/05/2014  . h/o Clinical depression 12/31/2013  . HLD (hyperlipidemia) 12/18/2013  . Genital Herpes 11/18/2013  . Cannot sleep 11/18/2013  . Extremity pain 11/18/2013  . Abnormal findings on diagnostic imaging of abdomen 10/29/2013  . Hereditary spastic  paraparesis (HCC) 10/28/2013  . Polypharmacy 08/18/2013  . Other long term (current) drug therapy 08/18/2013  . Chronic pain 08/11/2013  . Other specified disorders of urinary tract 07/09/2012  . Abnormality of gait 07/09/2012  . Lumbago 07/09/2012  . Disturbance of skin sensation 07/09/2012  . 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

## 2017-01-28 ENCOUNTER — Other Ambulatory Visit: Payer: Self-pay | Admitting: Family Medicine

## 2017-01-28 MED FILL — ACYCLOVIR 800 MG TABLET: 800 | 90 days supply | Qty: 90 | Fill #0

## 2017-01-30 ENCOUNTER — Ambulatory Visit: Payer: 59 | Admitting: Physical Therapy

## 2017-01-30 ENCOUNTER — Encounter: Payer: Self-pay | Admitting: Physical Therapy

## 2017-01-30 DIAGNOSIS — M6281 Muscle weakness (generalized): Secondary | ICD-10-CM | POA: Diagnosis not present

## 2017-01-30 DIAGNOSIS — R2689 Other abnormalities of gait and mobility: Secondary | ICD-10-CM | POA: Diagnosis not present

## 2017-01-30 DIAGNOSIS — R296 Repeated falls: Secondary | ICD-10-CM | POA: Diagnosis not present

## 2017-01-30 DIAGNOSIS — R2681 Unsteadiness on feet: Secondary | ICD-10-CM | POA: Diagnosis not present

## 2017-01-30 NOTE — Therapy (Signed)
[facility] [address] [place], [place], [address] Phone: [phone]   Fax:  [phone]  Physical Therapy Treatment  Patient Details  Name: Terry CyphersSteven M Howard MRN: [id number] Date of Birth: 1955-10-15 Referring Provider: York Spanielharles K Willis MD   Encounter Date: 01/30/2017      PT End of Session - 01/30/17 2033    Visit Number 4   Number of Visits 9   Date for PT Re-Evaluation 03/08/17   Author[facility] Type [facility]    PT Start Time 0845   PT Stop Time 0938   PT Time Calculation (min) 53 min   Equipment Utilized During Treatment Gait belt   Activity Tolerance Patient tolerated treatment well   Behavior [facility] tasks assessed/performed      Past Medical History:  Diagnosis Date  . Chronic insomnia   . Chronic low back pain    Annular tear at the L2-3 and L4-5 levels 4  . Gait disorder   . Hereditary spastic paraparesis (HCC)   . Hyperlipidemia   . 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 Neurogenic bladder   . RLS (restless legs syndrome)     Past Surgical History:  Procedure Laterality Date  . APPENDECTOMY    . BREAST SURGERY Bilateral    cysts  . COLONOSCOPY    . KNEE ARTHROSCOPY     right  . SHOULDER SURGERY     Rotator cuff, left  . ULNAR TUNNEL RELEASE Left 09/10/2014   Procedure: LEFT CUBITAL TUNNEL RELEASE;  Surgeon: Cheral AlmasNaiping Michael Xu, MD;  Location: [facility];  Service: Orthopedics;  Laterality: Left;    There were no vitals filed for this visit.      Subjective Assessment - 01/30/17 0845    Subjective He feels leather toe caps are already wearing out after 1 week. He fell after mowing a lawn and  pulled right adductor muscle with heat & ice so improving.    Pertinent History chronic insomnia, chronic low back pain, hereditary spastic paraparesis, hyperlipidemia, mild carpal tunnel syndrome of R wrist, neurogenic bladder, restless legs syndrome, R  TKA, L RTC surgery    Limitations Lifting;Standing;Walking;House hold activities   Patient Stated Goals increase balance, explore brace/orthotic options to aid in gait    Currently in Pain? No/denies           Therapeutic Exercise: PT instructed in stretching HEP for gastroc, hamstrings, hip flexors & hip adductors. Pt return demo & verbalized understanding.   Gait Training:  Pt ambulated 100' X 2 with significant toe drag. Pt performed stretching program and ambulated 100' & 150' with noted decrease toe drag. Pt reports decreased toe drag after stretching.                       PT Education - 01/30/17 0845    Education provided Yes   Education Details Presenter, broadcastingsteel toe caps design using internet as example, HEP for stretching   Person(s) Educated Patient   Methods Explanation;Other (comment);Demonstration;Tactile cues;Verbal cues;Handout  internet picture   Comprehension Verbalized understanding;Returned demonstration          PT Short Term Goals - 01/30/17 2034      PT SHORT TERM GOAL #1   Title Patient will verbalize understanding and return demonstration of initial HEP for LE strengthening, flexibility, and balance. (TARGET DATE: 02/08/2017)    Time 4   Period Weeks   Status On-going  PT SHORT TERM GOAL #2   Title Patient will demonstrate ability to safely ambulate 250 feet on indoor surfaces with cane and shoe modification and supervision to indicate a decrease in risk of falling. (TARGET DATE: 02/08/2017)     Time 4   Period Weeks   Status On-going     PT SHORT TERM GOAL #3   Title Patient will demonstrate ability to safely ambulate over ramp/curb and stairs (2 rails) with cane and shoe modification and supervision to demonstrate a decrease in risk of falling. (TARGET DATE: 02/08/2017)   Time 4   Period Weeks   Status On-going     PT SHORT TERM GOAL #4   Title Patient will verbalize understanding of shoe modification recommendation. (TARGET DATE:  02/08/2017)    Time 4   Period Weeks   Status Revised           PT Long Term Goals - 01/30/17 2035      PT LONG TERM GOAL #1   Title Patient will verbalize understanding and return demonstration for ongoing HEP for LE strengthening, flexibility, and balance. 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 (TARGET DATE: 03/08/2017)    Time 8   Period Weeks   Status On-going     PT LONG TERM GOAL #3   Title Patient will demonstrate ability to safely ambulate over ramp/curb/stairs (1 rail) with cane and shoe modification modified independent to indicate a decrease in risk of falling when returning to full community ambulation. (TARGET DATE: 03/08/2017)    Time 8   Period Weeks   Status On-going     PT LONG TERM GOAL #4   Title Patient will be able to independnetly donne and doffe B AFOs and verbalize understanding of proper wear schedule and care of B AFOs.    Baseline Patient is able to independently don and doff shoes with B leather toe caps. Patient is not utilizing B AFOs.    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 (TARGET DATE: 03/08/2017)     Time 8   Period Weeks   Status On-going     PT LONG TERM GOAL #6   Title Patient self reports >10% improvement in Activities of Balance Confidence using FOTO. Target Date: 03/08/2017    Time 8   Period Weeks   Status On-going               Plan - 01/30/17 2035    Clinical Impression  Statement Patient ambulated with less toe drag after stretching program performed. Patient is already wearing leather toe caps so will need more durable toe cap.    Rehab Potential Good   PT Frequency 1x / week   PT Duration 8 weeks   PT Treatment/Interventions ADLs/Self Care Home Management;Neuromuscular re-education;Balance training;Therapeutic exercise;Therapeutic activities;Functional mobility training;Stair training;Gait training;DME Instruction;Patient/family education;Orthotic Fit/Training;Energy conservation   PT Next Visit Plan continue to  work on LE Training and development officer with Plan of Care Patient      Patient will benefit from skilled therapeutic intervention in order to improve the following deficits and impairments:  Abnormal gait, Decreased activity tolerance, Decreased balance, Decreased range of motion, Decreased mobility, Decreased knowledge of use of DME, Decreased knowledge of precautions, Decreased endurance, Decreased strength, Difficulty walking, Impaired tone, Pain (PT will plan to address patient's pain via functional mobility training )  Visit Diagnosis: Unsteadiness on feet  Other abnormalities of gait and mobility  Muscle weakness (generalized)     Problem List Patient Active Problem List   Diagnosis Date Noted  . Statin intolerance 12/13/2016  . Vitamin D insufficiency 06/21/2016  . Elevated TSH 06/21/2016  . External hemorrhoids 05/18/2016  . Benign prostatic hyperplasia  L>R; without lower urinary tract symptoms 05/18/2016  . Health education/counseling 02/25/2016  . Restless legs syndrome (RLS) 02/15/2016  . Erectile dysfunction 02/15/2016  . h/o CLinically Signficant Anxiety 01/05/2014  . h/o Clinical depression 12/31/2013  . HLD (hyperlipidemia) 12/18/2013  . Genital Herpes 11/18/2013  . Cannot sleep 11/18/2013  . Extremity pain 11/18/2013  . Abnormal findings on diagnostic imaging of abdomen 10/29/2013  .  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 Polypharmacy 08/18/2013  . Other long term (current) drug therapy 08/18/2013  . Chronic pain 08/11/2013  . Other specified disorders of urinary tract 07/09/2012  . Abnormality of gait 07/09/2012  . Lumbago 07/09/2012  . Disturbance of skin sensation 07/09/2012  . 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 [place], [place], [address] Phone: [phone]   Fax:  [phone]  Name: EULICE RUTLEDGE MRN: [id number] Date of Birth: 02-20-1956

## 2017-01-30 NOTE — Patient Instructions (Addendum)
Stretches hold 20-30 seconds each 2-3 reps per leg Multiple per day    ANKLE: Dorsiflexion, Step Unilateral    Stand on step, hang one heel off back of step. Hold ___ seconds. ___ reps per set, ___ sets per day, ___ days per week Hold onto a support.  Copyright  VHI. All rights reserved.  Hamstring Stretch (Standing)    Standing, place one heel on chair or bench. Use one or both hands on thigh for support. Keeping torso straight, lean forward slowly until a stretch is felt in back of same thigh. Hold ____ seconds. Repeat with other leg.  Copyright  VHI. All rights reserved.  HIP: Hamstrings - Heel on Step    Place heel on step. Keep knee straight. Press heel into step. Lift chest. Hold ___ seconds. ___ reps per set, ___ sets per day, ___ days per week Hold onto a support.  Copyright  VHI. All rights reserved.  Adductor Side Lunge    Shift weight to one leg in a side lunge, keeping other leg straight, supporting weight with arms. Drop hips, keeping knee from turning in, and slide other foot further to side. Hold ___ seconds. Repeat ___ times per session. Do ___ sessions per day.  Copyright  VHI. All rights reserved.  Adductor Stretch, Standing: Customer service managerTriangle Pose (Chair)    Press into outer heels, rotate front knee to line up with toes. Feet 2-3 ft apart, heels aligned front to back, rotate at hips. Bottom hand on chair, reach top arm toward ceiling. Keep leg muscles engaged, knees neither hyper-extended nor wobbly. Hold for ____ breaths. Repeat ____ times each side.  Copyright  VHI. All rights reserved.  Adductors, Standing With Chair    Move palms from chair seat to floor. Move knees and feet apart. Keep spine straight. Hold ___ seconds. Repeat ___ times per session. Do ___ sessions per day.  Copyright  VHI. All rights reserved.  Hip Flexor Stretch    Lying on back near edge of bed, bend one leg, foot flat. Hang other leg over edge, relaxed, thigh resting  entirely on bed for ____ minutes. Repeat ____ times. Do ____ sessions per day. Advanced Exercise: Bend knee back keeping thigh in contact with bed.  http://[email or web address].exer.us/346   Copyright  VHI. All rights reserved.  Hip Flexors (Supine)    Lie with both legs bent over edge of firm surface. To stretch left hip, bring opposite knee to chest. Apply downward pressure to leg hanging over edge. Do not allow hips to roll up. Do not let knees change position. Hold ____ seconds. Repeat ____ times. Do ____ sessions per day. CAUTION: Stretch should be gentle, steady and slow.  Copyright  VHI. All rights reserved.  Quads / HF, Supine    Lie near edge of bed, one leg bent, foot flat on bed. Other leg hanging over edge, relaxed, thigh resting entirely on bed. Bend hanging knee backward keeping thigh in contact with bed. Hold ___ seconds.  Repeat ___ times per session. Do ___ sessions per day.  Copyright  VHI. All rights reserved.

## 2017-02-05 ENCOUNTER — Ambulatory Visit (INDEPENDENT_AMBULATORY_CARE_PROVIDER_SITE_OTHER): Payer: 59 | Admitting: Orthopaedic Surgery

## 2017-02-05 ENCOUNTER — Ambulatory Visit: Payer: 59 | Attending: Neurology | Admitting: Physical Therapy

## 2017-02-05 ENCOUNTER — Encounter: Payer: Self-pay | Admitting: Physical Therapy

## 2017-02-05 DIAGNOSIS — R296 Repeated falls: Secondary | ICD-10-CM | POA: Diagnosis not present

## 2017-02-05 DIAGNOSIS — M6281 Muscle weakness (generalized): Secondary | ICD-10-CM | POA: Insufficient documentation

## 2017-02-05 DIAGNOSIS — R2689 Other abnormalities of gait and mobility: Secondary | ICD-10-CM | POA: Insufficient documentation

## 2017-02-05 DIAGNOSIS — R2681 Unsteadiness on feet: Secondary | ICD-10-CM | POA: Insufficient documentation

## 2017-02-05 DIAGNOSIS — S76011A Strain of muscle, fascia and tendon of right hip, initial encounter: Secondary | ICD-10-CM

## 2017-02-05 MED ORDER — DIAZEPAM 5 MG PO TABS: 5.0000 mg | ORAL_TABLET | Freq: Four times a day (QID) | ORAL | 2 refills | Status: DC | PRN

## 2017-02-05 NOTE — Progress Notes (Signed)
Office Visit Note   Patient: Terry Howard           Date of Birth: 28-Apr-1956           MRN: [id number] Visit Date: 02/05/2017              Requested by: [name], DO [address] [place], [place] [address] PCP: [name], DO   Assessment & Plan: Visit Diagnoses:  1. Strain of right hip adductor muscle, initial encounter     Plan: I recommend iontophoresis with physical therapy. Valium prescription was prescribed. I don't think his presentation is consistent with a tear. Questions encouraged and answered. Follow-up with me as needed.  Follow-Up Instructions: Return if symptoms worsen or fail to improve.   Orders:  No orders of the defined types were placed in this encounter.  Meds ordered this encounter  Medications  . diazepam (VALIUM) 5 MG tablet    Sig: Take 1-2 tablets (5-10 mg total) by mouth every 6 (six) hours as needed for muscle spasms.    Dispense:  30 tablet    Refill:  2      Procedures: No procedures performed   Clinical Data: No additional findings.   Subjective: No chief complaint on file.   Terry Howard is a very pleasant 61 year old who has underlying spastic muscular condition comes in with 2-3 month history of a right adductor strain. He accidentally did a split in physical therapy and has had problems since then. He's tried multiple muscle relaxers and anti-inflammatories. He is currently doing physical therapy.  He denies any swelling or bruising.    Review of Systems  Constitutional: Negative.   All other systems reviewed and are negative.    Objective: Vital Signs: There were no vitals taken for this visit.  Physical Exam  Constitutional: He is oriented to person, place, and time. He appears well-developed and well-nourished.  HENT:  Head: Normocephalic and atraumatic.  Eyes: Pupils are equal, round, and reactive to light.  Neck: Neck supple.  Pulmonary/Chest: Effort normal.  Abdominal: Soft.    Musculoskeletal: Normal range of motion.  Neurological: He is alert and oriented to person, place, and time.  Skin: Skin is warm.  Psychiatric: He has a normal mood and affect. His behavior is normal. Judgment and thought content normal.  Nursing note and vitals reviewed.   Ortho Exam Right thigh exam shows no swelling or bruising or enlargement. There is no lymphadenopathy. He has tenderness directly on the adductor tendon. Specialty Comments:  No specialty comments available.  Imaging: No results found.   PMFS History: Patient Active Problem List   Diagnosis Date Noted  . Strain of right hip adductor muscle 02/05/2017  . Statin intolerance 12/13/2016  . Vitamin D insufficiency 06/21/2016  . Elevated TSH 06/21/2016  . External hemorrhoids 05/18/2016  . Benign prostatic hyperplasia  L>R; without lower urinary tract symptoms 05/18/2016  . Health education/counseling 02/25/2016  . Restless legs syndrome (RLS) 02/15/2016  . Erectile dysfunction 02/15/2016  . h/o CLinically Signficant Anxiety 01/05/2014  . h/o Clinical depression 12/31/2013  . HLD (hyperlipidemia) 12/18/2013  . Genital Herpes 11/18/2013  . Cannot sleep 11/18/2013  . Extremity pain 11/18/2013  . Abnormal findings on diagnostic imaging of abdomen 10/29/2013  . 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 Polypharmacy 08/18/2013  . Other long term (current) drug therapy 08/18/2013  . Chronic pain 08/11/2013  . Other specified disorders of urinary tract 07/09/2012  . Abnormality of gait 07/09/2012  . Lumbago  07/09/2012  . Disturbance of skin sensation 07/09/2012  . 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 Chronic insomnia   . Chronic low back pain    Annular tear at the L2-3 and L4-5 levels 4  . Gait disorder   . Hereditary spastic paraparesis (HCC)   . Hyperlipidemia   . 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 Neurogenic bladder   . RLS  (restless legs syndrome)     Family History  Problem Relation Age of Onset  . Cancer Mother        breast cancer  . Other Mother   . Hyperlipidemia Mother   . Stroke Father        Intracranial hemorrhage  . Other Brother   . Other Brother   . Other Brother     Past Surgical History:  Procedure Laterality Date  . APPENDECTOMY    . BREAST SURGERY Bilateral    cysts  . COLONOSCOPY    . KNEE ARTHROSCOPY     right  . SHOULDER SURGERY     Rotator cuff, left  . ULNAR TUNNEL RELEASE Left 09/10/2014   Procedure: LEFT CUBITAL TUNNEL RELEASE;  Surgeon: Cheral AlmasNaiping Michael Xu, MD;  Location: [facility];  Service: Orthopedics;  Laterality: Left;   Social History   Occupational History  . Unemployed     Disability   Social History Main Topics  . Smoking status: Never Smoker  . Smokeless tobacco: Never Used  . Alcohol use 4.2 oz/week    7 Cans of beer per week     Comment: Consumes alcohol on occasion  . Drug use: No  . Sexual activity: Yes    Birth control/ protection: Other-see comments     Comment: vasectomy

## 2017-02-07 NOTE — Therapy (Signed)
[facility] [address] [place], [place], [address] Phone: [phone]   Fax:  [phone]  Physical Therapy Treatment  Patient Details  Name: Terry Howard MRN: [id number] Date of Birth: Apr 01, 1956 Referring Provider: Kathrynn Ducking MD   Encounter Date: 02/05/2017   02/05/17 0900  PT Visits / Re-Eval  Visit Number 5  Number of Visits 9  Date for PT Re-Evaluation 03/08/17  Authorization  Authorization Type [employer] [employer] [employer]   PT Time Calculation  PT Start Time [phone]  PT Stop Time 0930  PT Time Calculation (min) 41 min  PT - End of Session  Equipment Utilized During Treatment Gait belt  Activity Tolerance Patient tolerated treatment well  Behavior During Therapy [facility] for tasks assessed/performed     Past Medical History:  Diagnosis Date  . Chronic insomnia   . Chronic low back pain    Annular tear at the L2-3 and L4-5 levels 4  . Gait disorder   . 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 Hyperlipidemia   . 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 Neurogenic bladder   . RLS (restless legs syndrome)     Past Surgical History:  Procedure Laterality Date  . APPENDECTOMY    . BREAST SURGERY Bilateral    cysts  . COLONOSCOPY    . KNEE ARTHROSCOPY     right  . SHOULDER SURGERY     Rotator cuff, left  . ULNAR TUNNEL RELEASE Left 09/10/2014   Procedure: LEFT CUBITAL TUNNEL RELEASE;  Surgeon: Marianna Payment, MD;  Location: [facility];  Service: Orthopedics;  Laterality: Left;    There were no vitals filed for this visit.     02/05/17 0853  Symptoms/Limitations  Subjective No new falls. Has multilple shoe modifications he has made simulating the leather toe caps. One shoe has PVC piping which he feels is working the best. 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   Pertinent History chronic insomnia, chronic low back pain, hereditary  spastic paraparesis, hyperlipidemia, mild carpal tunnel syndrome of R wrist, neurogenic bladder, restless legs syndrome, R TKA, L RTC surgery   Limitations Lifting;Standing;Walking;House hold activities  Patient Stated Goals increase balance, explore brace/orthotic options to aid in gait   Pain Assessment  Currently in Pain? Yes  Pain Score 5 (increases with activity)  Pain Location Leg (inner thigh/groin area)  Pain Orientation Right  Pain Descriptors / Indicators Aching;Sore;Tender  Pain Type Acute pain  Pain Onset 1 to 4 weeks ago  Pain Frequency Constant  Aggravating Factors  from recent fall (~2-3 weeks ago)  Pain Relieving Factors icy hot, medication, stretching at times      02/05/17 0901  Transfers  Transfers Sit to Stand;Stand to Sit  Sit to Stand 5: Supervision;With upper extremity assist;From chair/3-in-1  Stand to Sit 5: Supervision;With upper extremity assist;To chair/3-in-1;Uncontrolled descent  Ambulation/Gait  Ambulation/Gait Yes  Ambulation/Gait Assistance 5: Supervision  Ambulation Distance (Feet) 325 Feet  Assistive device Straight cane (with rubber tri tip;bil toe caps)  Gait Pattern Step-through pattern;Decreased arm swing - left;Decreased dorsiflexion - right;Decreased dorsiflexion - left;Right flexed knee in stance;Left flexed knee in stance;Poor foot clearance - left;Poor foot clearance - right;Decreased step length - right;Decreased step length - left;Decreased stride length;Decreased hip/knee flexion - right;Decreased hip/knee flexion - left;Narrow base of support  Ambulation Surface Level;Indoor  Stairs Yes  Stairs Assistance 5: Supervision  Stair Management  Technique Two rails;Alternating pattern;Forwards  Number of Stairs 4  Height of Stairs 6  Ramp 5: Supervision  Ramp Details (indicate cue type and reason) with cane/bil toe caps  Curb 5: Supervision  Curb Details (indicate cue type and reason) with cane/bil toe caps  Exercises  Other Exercises   seated with foot on chair: using strap for hamstring/calf stretch x 1-2 minute holds each side. at stairs: heels hanging off bottom step with bil UE support x 1 minute for calf stretching.             PT Short Term Goals - 02/05/17 0900      PT SHORT TERM GOAL #1   Title Patient will verbalize understanding and return demonstration of initial HEP for LE strengthening, flexibility, and balance. (TARGET DATE: 02/08/2017)    Baseline 02/05/17: met today   Time --   Period --   Status Achieved     PT SHORT TERM GOAL #2   Title Patient will demonstrate ability to safely ambulate 250 feet on indoor surfaces with cane and shoe modification and supervision to indicate a decrease in risk of falling. (TARGET DATE: 02/08/2017)     Baseline 02/05/17: met today   Time --   Period --   Status Achieved     PT SHORT TERM GOAL #3   Title Patient will demonstrate ability to safely ambulate over ramp/curb and stairs (2 rails) with cane and shoe modification and supervision to demonstrate a decrease in risk of falling. (TARGET DATE: 02/08/2017)   Baseline 02/05/17: met   Time --   Period --   Status Achieved     PT SHORT TERM GOAL #4   Title Patient will verbalize understanding of shoe modification recommendation. (TARGET DATE: 02/08/2017)    Baseline 02/05/17: met today   Status Achieved           PT Long Term Goals - 01/30/17 2035      PT LONG TERM GOAL #1   Title Patient will verbalize understanding and return demonstration for ongoing HEP for LE strengthening, flexibility, and balance. 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 (TARGET DATE: 03/08/2017)    Time 8   Period Weeks   Status On-going     PT LONG  TERM GOAL #3   Title Patient will demonstrate ability to safely ambulate over ramp/curb/stairs (1 rail) with cane and shoe modification modified independent to indicate a decrease in risk of falling when returning to full community ambulation. (TARGET DATE: 03/08/2017)    Time 8   Period Weeks   Status On-going     PT LONG TERM GOAL #4   Title Patient will be able to independnetly donne and doffe B AFOs and verbalize understanding of proper wear schedule and care of B AFOs.    Baseline Patient is able to independently don and doff shoes with B leather toe caps. Patient is not utilizing B AFOs.    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 (TARGET DATE: 03/08/2017)     Time 8   Period Weeks   Status On-going     PT LONG TERM GOAL #6   Title Patient self reports >10% improvement in Activities of Balance Confidence using FOTO. Target Date: 03/08/2017    Time 8   Period Weeks   Status On-going        02/05/17 0900  Plan  Clinical Impression Statement Pt met all STGs today and is progressing toward LTGs. Pt should benefit from continued PT to progress toward unmet goals.   Pt will benefit from skilled therapeutic intervention in order to improve on the following deficits Abnormal gait;Decreased activity tolerance;Decreased balance;Decreased range of motion;Decreased mobility;Decreased knowledge of use of DME;Decreased knowledge of precautions;Decreased endurance;Decreased strength;Difficulty walking;Impaired tone;Pain (PT will plan to address patient's pain via functional mobility training )  Rehab Potential Good  PT Frequency 1x / week  PT Duration 8 weeks  PT Treatment/Interventions ADLs/Self Care Home Management;Neuromuscular  re-education;Balance training;Therapeutic exercise;Therapeutic activities;Functional mobility training;Stair training;Gait training;DME Instruction;Patient/family education;Orthotic Fit/Training;Energy conservation  PT Next Visit Plan continue to work on LE strengthening and flexibility  Consulted and Agree with Plan of Care Patient       Patient will benefit from skilled therapeutic intervention in order to improve the following deficits and impairments:  Abnormal gait, Decreased activity tolerance, Decreased balance, Decreased range of motion, Decreased mobility, Decreased knowledge of use of DME, Decreased knowledge of precautions, Decreased endurance, Decreased strength, Difficulty walking, Impaired tone, Pain (PT will plan to address patient's pain via functional mobility training )  Visit Diagnosis: Unsteadiness on feet  Other abnormalities of gait and mobility  Muscle weakness (generalized)  Repeated falls     Problem List Patient Active Problem List   Diagnosis Date Noted  . Strain of right hip adductor muscle 02/05/2017  . Statin intolerance 12/13/2016  . Vitamin D insufficiency 06/21/2016  . Elevated TSH 06/21/2016  . External hemorrhoids 05/18/2016  . Benign prostatic hyperplasia  L>R; without lower urinary tract symptoms 05/18/2016  . Health education/counseling 02/25/2016  . Restless legs syndrome (RLS) 02/15/2016  . Erectile dysfunction 02/15/2016  . h/o CLinically Signficant Anxiety 01/05/2014  . h/o Clinical depression 12/31/2013  . HLD (hyperlipidemia) 12/18/2013  . Genital Herpes 11/18/2013  . Cannot sleep 11/18/2013  . Extremity pain 11/18/2013  . Abnormal findings on diagnostic imaging of abdomen 10/29/2013  . 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 Polypharmacy 08/18/2013  . Other long term (current) drug therapy 08/18/2013  . Chronic pain 08/11/2013  . Other specified disorders of urinary tract 07/09/2012  . Abnormality of gait 07/09/2012   . Lumbago 07/09/2012  . Disturbance of skin sensation 07/09/2012  . 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

## 2017-02-12 ENCOUNTER — Ambulatory Visit: Payer: 59 | Admitting: Physical Therapy

## 2017-02-12 ENCOUNTER — Encounter: Payer: Self-pay | Admitting: Physical Therapy

## 2017-02-12 DIAGNOSIS — R2681 Unsteadiness on feet: Secondary | ICD-10-CM | POA: Diagnosis not present

## 2017-02-12 DIAGNOSIS — R2689 Other abnormalities of gait and mobility: Secondary | ICD-10-CM

## 2017-02-12 DIAGNOSIS — R296 Repeated falls: Secondary | ICD-10-CM | POA: Diagnosis not present

## 2017-02-12 DIAGNOSIS — M6281 Muscle weakness (generalized): Secondary | ICD-10-CM | POA: Diagnosis not present

## 2017-02-12 NOTE — Therapy (Signed)
[facility] [address] [place], [place], [address] Phone: [phone]   Fax:  [phone]  Physical Therapy Treatment  Patient Details  Name: Terry Howard MRN: [id number] Date of Birth: 1955/08/15 Referring Provider: Kathrynn Ducking MD   Encounter Date: 02/12/2017      PT End of Session - 02/12/17 1051    Visit Number 6   Number of Visits 9   Date for PT Re-Evaluation 03/08/17   Authorization Type [facility]    PT Start Time 0845   PT Stop Time 0931   PT Time Calculation (min) 46 min   Activity Tolerance Patient tolerated treatment well   Behavior During Therapy [facility] for tasks assessed/performed      Past Medical History:  Diagnosis Date  . Chronic insomnia   . Chronic low back pain    Annular tear at the L2-3 and L4-5 levels 4  . Gait disorder   . 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 Hyperlipidemia   . 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 Neurogenic bladder   . RLS (restless legs syndrome)     Past Surgical History:  Procedure Laterality Date  . APPENDECTOMY    . BREAST SURGERY Bilateral    cysts  . COLONOSCOPY    . KNEE ARTHROSCOPY     right  . SHOULDER SURGERY     Rotator cuff, left  . ULNAR TUNNEL RELEASE Left 09/10/2014   Procedure: LEFT CUBITAL TUNNEL RELEASE;  Surgeon: Marianna Payment, MD;  Location: [facility];  Service: Orthopedics;  Laterality: Left;    There were no vitals filed for this visit.      Subjective Assessment - 02/12/17 0847    Subjective Patient presents to PT with B toe caps donned that he created (thicker leather due to wearing out his other toe caps already). Patient reports intermittent compliance with HEP for stretching due to adductor stain discomfort.    Pertinent History chronic insomnia, chronic low back pain, hereditary spastic paraparesis, hyperlipidemia, mild carpal tunnel syndrome of R wrist, neurogenic bladder,  restless legs syndrome, R TKA, L RTC surgery    Limitations Lifting;Standing;Walking;House hold activities   Patient Stated Goals increase balance, explore brace/orthotic options to aid in gait    Pain Onset 1 to 4 weeks ago                         [facility] Adult PT Treatment/Exercise - 02/12/17 0845      Transfers   Transfers Sit to Stand;Stand to Sit   Sit to Stand 5: Supervision;With upper extremity assist;From chair/3-in-1   Stand to Sit 5: Supervision;With upper extremity assist;To chair/3-in-1;Uncontrolled descent     Ambulation/Gait   Ambulation/Gait Yes   Ambulation/Gait Assistance 5: Supervision   Ambulation Distance (Feet) --  multiple short distances between activities in clinic   Assistive device Straight cane  with rubber tri tip;bil toe caps   Gait Pattern Step-through pattern;Decreased arm swing - left;Decreased dorsiflexion - right;Decreased dorsiflexion - left;Right flexed knee in stance;Left flexed knee in stance;Poor foot clearance - left;Poor foot clearance - right;Decreased step length - right;Decreased step length - left;Decreased stride length;Decreased hip/knee flexion - right;Decreased hip/knee flexion - left;Narrow base of support   Ambulation Surface Level;Indoor   Stairs --   Stairs Assistance --   Stair Management Technique --   Number of Stairs --   Height of Stairs --  Ramp --   Curb --     Exercises   Other Exercises  See patient instructions listed below        Adductors, Sitting With Rotation    Sit with legs open in a wide V, hands on knees. Keep spine straight and support trunk with arms. Allow trunk to tip forward. Press knees apart. Twist body to one side. Hold _20-30__ seconds. Repeat _2-3__ times per session. Do _1-2__ sessions per day. HOLD once you feel a gentle stretch on the inside of your leg.   Copyright  VHI. All rights reserved.  Chair Sitting    Sit at edge of seat, spine straight, one leg extended. Put a  hand on each thigh and bend forward from the hip, keeping spine straight. Allow hand on extended leg to reach toward toes. Support upper body with other arm. Hold _20-30__ seconds. Repeat _2-3__ times per session. Do _1-2__ sessions per day.  Copyright  VHI. All rights reserved.  ANKLE: Dorsiflexion - Sitting    Sitting, place strap (use belt or towel) around foot. Pull foot toward body, keeping heel on floor. Keep foot straight. Hold _20-30__ seconds. _2-3__ reps per set, _1-2__ sets per day, _7__ days per week.   Copyright  VHI. All rights reserved.  ABDUCTION: Standing - Resistance Band (Active)    Stand, feet flat. Against yellow resistance band, lift right leg out to side. Complete _1__ sets of _10-15__ repetitions. Perform 3-4 days/week.   http://gtsc.exer.us/116   Copyright  VHI. All rights reserved.   EXTENSION: Standing - Resistance Band (Active)   Stand, both feet flat. Against yellow resistance band, draw right leg behind body as far as possible. Complete _1__ sets of _10-15__ repetitions. Perform 3-4 days/week.   http://gtsc.exer.us/82   Copyright  VHI. All rights reserved.   EXTENSION: Standing - Stable: Resistance Band (Active)    Stand with right knee bent. Against yellow resistance band, straighten fully, moving foot forward. Complete _1__ sets of _10-15__ repetitions. Perform 3-4 days/week.   Copyright  VHI. All rights reserved.   ADDUCTION: Standing - Stable: Resistance Band (Active)    Stand, right leg out to side as far as possible. Against yellow resistance band, draw leg in across midline. Complete _1__ sets of __10-15_ repetitions. Perform 3-4 days/week.   http://gtsc.exer.us/150   Copyright  VHI. All rights reserved.   NOTE: For all leg exercises, perform exercises on BOTH legs, with a chair next to you to hold on, and always pull leg "away" from where the red band is tied.             PT Education - 02/12/17 0849     Education provided Yes   Education Details information on muscle tone and its impact on his adductor healing process; explained why AFO is not appropriate for patient because his weakness is more proximal (knee/hip) not ankle; importance of flexibility HEP compliance throughout the day via GENTLE stretching (especially on adductors); look into dry needling, and notify PT if interested (as PT will have to coordinate session at [facility] location); updated HEP    Person(s) Educated Patient;Spouse   Methods Explanation;Demonstration;Tactile cues;Verbal cues;Handout   Comprehension Verbalized understanding;Returned demonstration          PT Short Term Goals - 02/05/17 0900      PT SHORT TERM GOAL #1   Title Patient will verbalize understanding and return demonstration of initial HEP for LE strengthening, flexibility, and balance. (TARGET DATE: 02/08/2017)    Baseline 02/05/17:  met today   Time --   Period --   Status Achieved     PT SHORT TERM GOAL #2   Title Patient will demonstrate ability to safely ambulate 250 feet on indoor surfaces with cane and shoe modification and supervision to indicate a decrease in risk of falling. (TARGET DATE: 02/08/2017)     Baseline 02/05/17: met today   Time --   Period --   Status Achieved     PT SHORT TERM GOAL #3   Title Patient will demonstrate ability to safely ambulate over ramp/curb and stairs (2 rails) with cane and shoe modification and supervision to demonstrate a decrease in risk of falling. (TARGET DATE: 02/08/2017)   Baseline 02/05/17: met   Time --   Period --   Status Achieved     PT SHORT TERM GOAL #4   Title Patient will verbalize understanding of shoe modification recommendation. (TARGET DATE: 02/08/2017)    Baseline 02/05/17: met today   Status Achieved           PT Long Term Goals - 01/30/17 2035      PT LONG TERM GOAL #1   Title Patient will verbalize understanding and return demonstration for ongoing HEP for LE strengthening,  flexibility, and balance. 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 (TARGET DATE: 03/08/2017)    Time 8   Period Weeks   Status On-going     PT LONG TERM GOAL #3   Title Patient will demonstrate ability to safely ambulate over ramp/curb/stairs (1 rail) with cane and shoe modification modified independent to indicate a decrease in risk of falling when returning to full community ambulation. (TARGET DATE: 03/08/2017)    Time 8   Period Weeks   Status On-going     PT LONG TERM GOAL #4   Title Patient will be able to independnetly donne and doffe B AFOs and verbalize understanding of proper wear schedule and care of B AFOs.    Baseline Patient is able to independently don and doff shoes with B leather toe caps. Patient is not utilizing B AFOs.    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 (TARGET DATE: 03/08/2017)     Time 8   Period Weeks   Status On-going     PT LONG TERM GOAL #6   Title Patient self reports >10% improvement in Activities of Balance Confidence using FOTO. Target Date: 03/08/2017    Time 8   Period Weeks   Status On-going               Plan - 02/12/17 1052    Clinical Impression Statement Today's skilled PT session focused on reviewing patient's HEP in order to consolidate exercises and ensure the patient's activities were appropriate given the patient's current adductor strain. PT educated on importance of flexibility  yet only performing gentle stretching as to not increase his tone.  Patient verbalized understanding. Patient is making progress towards goals, and will benefit from continued skilled PT to address functional mobility deficits.    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)    Consulted and Agree with Plan of Care Patient      Patient will benefit from skilled therapeutic intervention in order to improve the following deficits and impairments:  Abnormal gait, Decreased activity tolerance, Decreased balance, Decreased range of motion, Decreased mobility, Decreased knowledge of use of DME, Decreased knowledge of precautions, Decreased endurance, Decreased strength, Difficulty walking, Impaired tone, Pain (PT will plan to address patient's pain via functional mobility training )  Visit Diagnosis: Unsteadiness on feet  Other abnormalities of gait and mobility  Muscle weakness (generalized)  Repeated falls     Problem List Patient Active Problem List   Diagnosis Date Noted  . Strain of right hip adductor muscle 02/05/2017  . Statin intolerance 12/13/2016  . Vitamin D insufficiency 06/21/2016  . Elevated TSH 06/21/2016  . External hemorrhoids 05/18/2016  . Benign prostatic hyperplasia  L>R; without lower urinary tract symptoms 05/18/2016  . Health education/counseling 02/25/2016  . Restless legs syndrome (RLS) 02/15/2016  . Erectile dysfunction 02/15/2016  . h/o  CLinically Signficant Anxiety 01/05/2014  . h/o Clinical depression 12/31/2013  . HLD (hyperlipidemia) 12/18/2013  . Genital Herpes 11/18/2013  . Cannot sleep 11/18/2013  . Extremity pain 11/18/2013  . Abnormal findings on diagnostic imaging of abdomen 10/29/2013  . 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 Polypharmacy 08/18/2013  . Other long term (current) drug therapy 08/18/2013  . Chronic pain 08/11/2013  . Other specified disorders of urinary tract 07/09/2012  . Abnormality of gait 07/09/2012  . Lumbago 07/09/2012  . Disturbance of skin sensation 07/09/2012  . 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 [place], [place], [address] Phone: [phone]   Fax:  [phone]  Name: Terry Howard MRN: [id number] Date of Birth: October 20, 1955

## 2017-02-12 NOTE — Patient Instructions (Addendum)
Adductors, Sitting With Rotation    Sit with legs open in a wide V, hands on knees. Keep spine straight and support trunk with arms. Allow trunk to tip forward. Press knees apart. Twist body to one side. Hold _20-30__ seconds. Repeat _2-3__ times per session. Do _1-2__ sessions per day. HOLD once you feel a gentle stretch on the inside of your leg.   Copyright  VHI. All rights reserved.  Chair Sitting    Sit at edge of seat, spine straight, one leg extended. Put a hand on each thigh and bend forward from the hip, keeping spine straight. Allow hand on extended leg to reach toward toes. Support upper body with other arm. Hold _20-30__ seconds. Repeat _2-3__ times per session. Do _1-2__ sessions per day.  Copyright  VHI. All rights reserved.  ANKLE: Dorsiflexion - Sitting    Sitting, place strap (use belt or towel) around foot. Pull foot toward body, keeping heel on floor. Keep foot straight. Hold _20-30__ seconds. _2-3__ reps per set, _1-2__ sets per day, _7__ days per week.   Copyright  VHI. All rights reserved.  ABDUCTION: Standing - Resistance Band (Active)    Stand, feet flat. Against yellow resistance band, lift right leg out to side. Complete _1__ sets of _10-15__ repetitions. Perform 3-4 days/week.   http://gtsc.exer.us/116   Copyright  VHI. All rights reserved.   EXTENSION: Standing - Resistance Band (Active)   Stand, both feet flat. Against yellow resistance band, draw right leg behind body as far as possible. Complete _1__ sets of _10-15__ repetitions. Perform 3-4 days/week.   http://gtsc.exer.us/82   Copyright  VHI. All rights reserved.   EXTENSION: Standing - Stable: Resistance Band (Active)    Stand with right knee bent. Against yellow resistance band, straighten fully, moving foot forward. Complete _1__ sets of _10-15__ repetitions. Perform 3-4 days/week.   Copyright  VHI. All rights reserved.   ADDUCTION: Standing - Stable: Resistance Band  (Active)    Stand, right leg out to side as far as possible. Against yellow resistance band, draw leg in across midline. Complete _1__ sets of __10-15_ repetitions. Perform 3-4 days/week.   http://gtsc.exer.us/150   Copyright  VHI. All rights reserved.   NOTE: For all leg exercises, perform exercises on BOTH legs, with a chair next to you to hold on, and always pull leg "away" from where the red band is tied.

## 2017-02-19 ENCOUNTER — Ambulatory Visit: Payer: 59 | Admitting: Physical Therapy

## 2017-02-19 ENCOUNTER — Encounter: Payer: Self-pay | Admitting: Physical Therapy

## 2017-02-19 DIAGNOSIS — M6281 Muscle weakness (generalized): Secondary | ICD-10-CM | POA: Diagnosis not present

## 2017-02-19 DIAGNOSIS — R2681 Unsteadiness on feet: Secondary | ICD-10-CM

## 2017-02-19 DIAGNOSIS — R296 Repeated falls: Secondary | ICD-10-CM | POA: Diagnosis not present

## 2017-02-19 DIAGNOSIS — R2689 Other abnormalities of gait and mobility: Secondary | ICD-10-CM

## 2017-02-19 NOTE — Therapy (Signed)
[facility] [address] [place], [place], [address] Phone: [phone]   Fax:  [phone]  Physical Therapy Treatment  Patient Details  Name: PARTHIV MUCCI MRN: [id number] Date of Birth: 04/02/1956 Referring Provider: Kathrynn Ducking MD   Encounter Date: 02/19/2017      PT End of Session - 02/19/17 1305    Visit Number 7   Number of Visits 9   Date for PT Re-Evaluation 03/08/17   Authorization Type [facility]    PT Start Time [phone]   PT Stop Time 0940   PT Time Calculation (min) 54 min   Activity Tolerance Patient tolerated treatment well   Behavior During Therapy [facility] for tasks assessed/performed      Past Medical History:  Diagnosis Date  . Chronic insomnia   . Chronic low back pain    Annular tear at the L2-3 and L4-5 levels 4  . Gait disorder   . 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 Hyperlipidemia   . 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 Neurogenic bladder   . RLS (restless legs syndrome)     Past Surgical History:  Procedure Laterality Date  . APPENDECTOMY    . BREAST SURGERY Bilateral    cysts  . COLONOSCOPY    . KNEE ARTHROSCOPY     right  . SHOULDER SURGERY     Rotator cuff, left  . ULNAR TUNNEL RELEASE Left 09/10/2014   Procedure: LEFT CUBITAL TUNNEL RELEASE;  Surgeon: Marianna Payment, MD;  Location: [facility];  Service: Orthopedics;  Laterality: Left;    There were no vitals filed for this visit.      Subjective Assessment - 02/19/17 0855    Subjective No falls. Exercises are going well. Got Artic Blast with DMS for groin pull ~3 days and feels better.    Pertinent History chronic insomnia, chronic low back pain, hereditary spastic paraparesis, hyperlipidemia, mild carpal tunnel syndrome of R wrist, neurogenic bladder, restless legs syndrome, R TKA, L RTC surgery    Limitations Lifting;Standing;Walking;House hold activities   Patient Stated  Goals increase balance, explore brace/orthotic options to aid in gait    Currently in Pain? Yes   Pain Score 1    Pain Location Leg   Pain Orientation Right;Medial   Pain Descriptors / Indicators Aching;Sore   Pain Type Acute pain   Pain Onset 1 to 4 weeks ago   Pain Frequency Constant   Aggravating Factors  moves way that pulls adductor   Pain Relieving Factors icy hot, medication & stretches            [facility] PT Assessment - 02/19/17 0845      Observation/Other Assessments   Focus on Therapeutic Outcomes (FOTO)  52 Functional Status  Initial was 29   Activities of Balance Confidence Scale (ABC Scale)  71.3 %  Initial was 21.3%   Fear Avoidance Belief Questionnaire (FABQ)  19 (5)                     [facility] Adult PT Treatment/Exercise - 02/19/17 0845      Transfers   Transfers Sit to Stand;Stand to Sit   Sit to Stand 6: Modified independent (Device/Increase time);With upper extremity assist;From chair/3-in-1   Stand to Sit 6: Modified independent (Device/Increase time);With upper extremity assist;To chair/3-in-1     Ambulation/Gait   Ambulation/Gait Yes   Ambulation/Gait Assistance 6: Modified independent (Device/Increase time)  Ambulation Distance (Feet) 500 Feet   Assistive device Straight cane   Gait Pattern Step-through pattern;Shuffle;Trunk flexed;Poor foot clearance - left;Poor foot clearance - right   Ambulation Surface Indoor;Level;Outdoor;Unlevel;Paved;Gravel;Grass   Stairs Yes   Stairs Assistance 6: Modified independent (Device/Increase time)   Stair Management Technique One rail Right;With cane;Alternating pattern;Forwards                PT Education - 02/19/17 0900    Education provided Yes   Education Details Recommendation for use of new self made shoe modications outdoors. He reports no shoes indoor and PT recommended snug fitting slipper / mocossain or slip on shoe with heel support with leather toe cap for stability.    Person(s)  Educated Patient;Spouse   Methods Explanation;Verbal cues   Comprehension Verbalized understanding          PT Short Term Goals - 02/05/17 0900      PT SHORT TERM GOAL #1   Title Patient will verbalize understanding and return demonstration of initial HEP for LE strengthening, flexibility, and balance. (TARGET DATE: 02/08/2017)    Baseline 02/05/17: met today   Time --   Period --   Status Achieved     PT SHORT TERM GOAL #2   Title Patient will demonstrate ability to safely ambulate 250 feet on indoor surfaces with cane and shoe modification and supervision to indicate a decrease in risk of falling. (TARGET DATE: 02/08/2017)     Baseline 02/05/17: met today   Time --   Period --   Status Achieved     PT SHORT TERM GOAL #3   Title Patient will demonstrate ability to safely ambulate over ramp/curb and stairs (2 rails) with cane and shoe modification and supervision to demonstrate a decrease in risk of falling. (TARGET DATE: 02/08/2017)   Baseline 02/05/17: met   Time --   Period --   Status Achieved     PT SHORT TERM GOAL #4   Title Patient will verbalize understanding of shoe modification recommendation. (TARGET DATE: 02/08/2017)    Baseline 02/05/17: met today   Status Achieved           PT Long Term Goals - 02/19/17 1306      PT LONG TERM GOAL #1   Title Patient will verbalize understanding and return demonstration for ongoing HEP for LE strengthening, flexibility, and balance. 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 (TARGET DATE: 03/08/2017)    Baseline MET 7/17/018   Time 8   Period Weeks   Status Achieved     PT LONG TERM GOAL #3   Title Patient will  demonstrate ability to safely ambulate over ramp/curb/stairs (1 rail) with cane and shoe modification modified independent to indicate a decrease in risk of falling when returning to full community ambulation. (TARGET DATE: 03/08/2017)    Baseline MET 02/19/2017   Time 8   Period Weeks   Status Achieved     PT LONG TERM GOAL #4   Title Patient will be able to independnetly donne and doffe B AFOs and verbalize understanding of proper wear schedule and care of B AFOs.    Baseline  Patient is able to independently don and doff shoes with B leather toe caps. Patient is not utilizing B AFOs.    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 (TARGET DATE: 03/08/2017)     Baseline MET 02/19/2017   Time 8   Period Weeks   Status Achieved     PT LONG TERM GOAL #6   Title Patient self reports >10% improvement in Activities of Balance Confidence using FOTO. Target Date: 03/08/2017    Baseline MET 02/19/2017 AB 71.3% for 50% improvement   Time 8   Period Weeks   Status Achieved             Patient will benefit from skilled therapeutic intervention in order to improve the following deficits and impairments:     Visit Diagnosis: Unsteadiness on feet  Other abnormalities of gait and mobility  Muscle weakness (generalized)     Problem List Patient Active Problem List   Diagnosis Date Noted  . Strain of right hip adductor muscle 02/05/2017  . Statin intolerance 12/13/2016  . Vitamin D insufficiency 06/21/2016  . Elevated TSH 06/21/2016  . External hemorrhoids 05/18/2016  . Benign prostatic hyperplasia  L>R; without lower urinary tract symptoms 05/18/2016  . Health education/counseling 02/25/2016  . Restless legs syndrome (RLS)  02/15/2016  . Erectile dysfunction 02/15/2016  . h/o CLinically Signficant Anxiety 01/05/2014  . h/o Clinical depression 12/31/2013  . HLD (hyperlipidemia) 12/18/2013  . Genital Herpes 11/18/2013  . Cannot sleep 11/18/2013  . Extremity pain 11/18/2013  . Abnormal findings on diagnostic imaging of abdomen 10/29/2013  . 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 Polypharmacy 08/18/2013  . Other long term (current) drug therapy 08/18/2013  . Chronic pain 08/11/2013  . Other specified disorders of urinary tract 07/09/2012  . Abnormality of gait 07/09/2012  . Lumbago 07/09/2012  . Disturbance of skin sensation 07/09/2012  . 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  PT assessed patient with a variety of AFO designs which all negatively impacted his balance & gait. Patient's gait deviations are more related to hip & knee weakness than ankle weakness. Leather toe caps or shoe modifications to decrease friction of ground at forefoot / toes in late stance improved gait. Patient has weakness, decreased ROM & hypertonicity impacting balance & gait. He understands exercise program that appears to improve his gait & balance.    Education / Equipment: HEP & shoe modification. Pt self made heavier duty version of toe caps that he prefers. Plan: Patient agrees to discharge.  Patient goals were not met. Patient is being discharged due to meeting the stated rehab goals.  ?????        Cecila Satcher  PT, DPT 02/19/2017, 1:14 PM  [facility] [address] [place], [place], [address] Phone: [phone]   Fax:  [phone]  Name: Terry Howard MRN: [id number] Date of Birth: 02/06/1956

## 2017-02-26 ENCOUNTER — Ambulatory Visit: Payer: 59 | Admitting: Physical Therapy

## 2017-03-05 ENCOUNTER — Ambulatory Visit: Payer: 59 | Admitting: Physical Therapy

## 2017-03-19 MED FILL — rOPINIRole HCL 2 MG TABS: 2 | 90 days supply | Qty: 45 | Fill #1

## 2017-03-19 MED FILL — BACLOFEN 10 MG TABLET: 10 | 33 days supply | Qty: 50 | Fill #1

## 2017-04-12 ENCOUNTER — Telehealth (INDEPENDENT_AMBULATORY_CARE_PROVIDER_SITE_OTHER): Payer: Self-pay | Admitting: Physical Medicine and Rehabilitation

## 2017-04-12 NOTE — Telephone Encounter (Signed)
Patient called back. I called and left another message.

## 2017-04-12 NOTE — Telephone Encounter (Signed)
Scheduled pt for ov on 04/24/17

## 2017-04-22 ENCOUNTER — Ambulatory Visit (INDEPENDENT_AMBULATORY_CARE_PROVIDER_SITE_OTHER): Payer: 59 | Admitting: Family Medicine

## 2017-04-22 ENCOUNTER — Encounter: Payer: Self-pay | Admitting: Family Medicine

## 2017-04-22 VITALS — BP 119/82 | HR 71 | Ht 66.75 in | Wt 142.0 lb

## 2017-04-22 DIAGNOSIS — F329 Major depressive disorder, single episode, unspecified: Secondary | ICD-10-CM | POA: Diagnosis not present

## 2017-04-22 DIAGNOSIS — E559 Vitamin D deficiency, unspecified: Secondary | ICD-10-CM | POA: Diagnosis not present

## 2017-04-22 DIAGNOSIS — F32A Depression, unspecified: Secondary | ICD-10-CM

## 2017-04-22 DIAGNOSIS — E785 Hyperlipidemia, unspecified: Secondary | ICD-10-CM

## 2017-04-22 DIAGNOSIS — R946 Abnormal results of thyroid function studies: Secondary | ICD-10-CM

## 2017-04-22 DIAGNOSIS — G114 Hereditary spastic paraplegia: Secondary | ICD-10-CM | POA: Diagnosis not present

## 2017-04-22 DIAGNOSIS — R269 Unspecified abnormalities of gait and mobility: Secondary | ICD-10-CM | POA: Diagnosis not present

## 2017-04-22 DIAGNOSIS — M25512 Pain in left shoulder: Secondary | ICD-10-CM

## 2017-04-22 DIAGNOSIS — Z789 Other specified health status: Secondary | ICD-10-CM | POA: Diagnosis not present

## 2017-04-22 DIAGNOSIS — G8929 Other chronic pain: Secondary | ICD-10-CM

## 2017-04-22 DIAGNOSIS — R7989 Other specified abnormal findings of blood chemistry: Secondary | ICD-10-CM

## 2017-04-22 DIAGNOSIS — G2581 Restless legs syndrome: Secondary | ICD-10-CM | POA: Diagnosis not present

## 2017-04-22 NOTE — Patient Instructions (Signed)
Please come in for your yearly wellness exam on 10\12\18 or a week or so later.  Your last physical was on 10\11\17.  Please come fasting so we can get your yearly labs as well and check your cholesterol, electrolytes etc.  Guidelines for a Low Cholesterol, Low Saturated Fat Diet   Fats - Limit total intake of fats and oils. - Avoid butter, stick margarine, shortening, lard, palm and coconut oils. - Limit mayonnaise, salad dressings, gravies and sauces, unless they are homemade with low-fat ingredients. - Limit chocolate. - Choose low-fat and nonfat products, such as low-fat mayonnaise, low-fat or non-hydrogenated peanut butter, low-fat or fat-free salad dressings and nonfat gravy. - Use vegetable oil, such as canola or olive oil. - Look for margarine that does not contain trans fatty acids. - Use nuts in moderate amounts. - Read ingredient labels carefully to determine both amount and type of fat present in foods. Limit saturated and trans fats! - Avoid high-fat processed and convenience foods.  Meats and Meat Alternatives - Choose fish, chicken, [place] and lean meats. - Use dried beans, peas, lentils and tofu. - Limit egg yolks to three to four per week. - If you eat red meat, limit to no more than three servings per week and choose loin or round cuts. - Avoid fatty meats, such as bacon, sausage, franks, luncheon meats and ribs. - Avoid all organ meats, including liver.  Dairy - Choose nonfat or low-fat milk, yogurt and cottage cheese. - Most cheeses are high in fat. Choose cheeses made from non-fat milk, such as mozzarella and ricotta cheese. - Choose light or fat-free cream cheese and sour cream. - Avoid cream and sauces made with cream.  Fruits and Vegetables - Eat a wide variety of fruits and vegetables. - Use lemon juice, vinegar or "mist" olive oil on vegetables. - Avoid adding sauces, fat or oil to vegetables.  Breads, Cereals and Grains - Choose whole-grain breads,  cereals, pastas and rice. - Avoid high-fat snack foods, such as granola, cookies, pies, pastries, doughnuts and croissants.  Cooking Tips - Avoid deep fried foods. - Trim visible fat off meats and remove skin from poultry before cooking. - Bake, broil, boil, poach or roast poultry, fish and lean meats. - Drain and discard fat that drains out of meat as you cook it. - Add little or no fat to foods. - Use vegetable oil sprays to grease pans for cooking or baking. - Steam vegetables. - Use herbs or no-oil marinades to flavor foods.

## 2017-04-22 NOTE — Assessment & Plan Note (Signed)
We'll obtain TSH, F T4 with upcoming labs.

## 2017-04-22 NOTE — Assessment & Plan Note (Signed)
Patient declines further evaluation by another specialist such as PM and R or other neuromuscular specialist.

## 2017-04-22 NOTE — Assessment & Plan Note (Signed)
>>  ASSESSMENT AND PLAN FOR H/O CLINICAL DEPRESSION WRITTEN ON 04/22/2017  9:00 AM BY OPALSKI, DEBORAH, DO  Emotionally doing well.  Denies any need for medication or counseling.

## 2017-04-22 NOTE — Assessment & Plan Note (Signed)
Patient is due to follow up with [facility] orthopedics next week.  He will discuss his shoulder with them

## 2017-04-22 NOTE — Assessment & Plan Note (Signed)
We will check his fasting lipid profile at his follow-up yearly physical in mid October. - Patient eats healthy and tries to move as much as possible.

## 2017-04-22 NOTE — Assessment & Plan Note (Signed)
Continue current treatment plan with [facility]. Adequate hydration and exercise discussed

## 2017-04-22 NOTE — Progress Notes (Signed)
Impression and Recommendations:    1. Hereditary spastic paraparesis (HCC)   2. Restless legs syndrome (RLS)   3. Abnormality of gait   4. Hyperlipidemia, unspecified hyperlipidemia type   5. Statin intolerance   6. Elevated TSH   7. Vitamin D insufficiency   8. Chronic left shoulder pain   9. Depression, unspecified depression type     Hereditary spastic paraparesis (HCC)  Restless legs syndrome (RLS)  Abnormality of gait  Hyperlipidemia, unspecified hyperlipidemia type  Statin intolerance  Elevated TSH  Vitamin D insufficiency  Chronic left shoulder pain  Depression, unspecified depression type  Hereditary spastic paraparesis (HCC) - Patient declines baclofen pump - Patient remain under treatment of Dr. Anne Hahn and declines further referral to [facility] or other specialists to see about other treatment options to help him with his ambulation and function  Restless legs syndrome (RLS) Continue current treatment plan with Requip. Adequate hydration and exercise discussed  Hyperlipidemia We will check his fasting lipid profile at his follow-up yearly physical in mid October. - Patient eats healthy and tries to move as much as possible.    Statin intolerance - Unable to tolerate and cause significant increase in his muscular pains and spastic paraparesis symptoms.  Elevated TSH We'll obtain TSH, F T4 with upcoming labs.  Abnormality of gait Patient declines further evaluation by another specialist such as PM and R or other neuromuscular specialist.  Chronic left shoulder pain Patient is due to follow up with [facility] orthopedics next week.  He will discuss his shoulder with them   h/o Clinical depression Emotionally doing well.  Denies any need for medication or counseling.   The patient was counseled, risk factors were discussed, anticipatory guidance given.   New Prescriptions   No medications on file    Discontinued Medications   No  medications on file    Modified Medications   No medications on file     No orders of the defined types were placed in this encounter.    No orders of the defined types were placed in this encounter.    Gross side effects, risk and benefits, and alternatives of medications and treatment plan in general discussed with patient.  Patient is aware that all medications have potential side effects and we are unable to predict every side effect or drug-drug interaction that may occur.   Patient will call with any questions prior to using medication if they have concerns.  Expresses verbal understanding and consents to current therapy and treatment regimen.  No barriers to understanding were identified.  Red flag symptoms and signs discussed in detail.  Patient expressed understanding regarding what to do in case of emergency\urgent symptoms  Please see AVS handed out to patient at the end of our visit for further patient instructions/ counseling done pertaining to today's office visit.   Return in about 4 weeks (around 05/20/2017) for For wellness exam & health maintenance eval-- come fasting.     Note: This document was prepared using Dragon voice recognition software and may include unintentional dictation errors.  Terry Howard 9:00 AM --------------------------------------------------------------------------------------------------------------------------------------------------------------------------------------------------------------------------------------------    Subjective:    CC:  Chief Complaint  Patient presents with  . Follow-up    HPI: Terry Howard is a 61 y.o. male who presents to [facility] today for issues as discussed below.   Problem  Statin Intolerance  Restless Legs Syndrome (Rls)   Tolerating Requip well.  Patient feels like  it even helps his hereditary spastic paraparesis symptoms as well.  Symptoms well  controlled.   Hyperlipidemia     Last Assessment & Plan:  Has stopped Pravastatin on his own 2 wks ago & R foot pain/ restless leg syndrome has dramatically improved; return in 2 mths to recheck lipids to determine baseline without meds to make a better decision on the risks/benefits of being on a statin; try to make food choices which are lower in fat   Hereditary spastic paraparesis (HCC)   Hereditary-  Difficulty with walking, trips easily.  20 yrs ago- dragging feet, affects primarily legs- weakness, unsteady.  - Both of his brothers are in wheelchairs already.  Patient does not want to sit around and stop moving.  - Being managed by Dr. Anne Hahn of neurology.  A couple of months ago he was seen and Dr. Anne Hahn sent the patient to physical therapy and rehabilitation.  He went for about a month or so.  Patient stated he did nothing different than what he does at home.  Patient is frustrated and endorses Dr. Anne Hahn says this is a progressive disease and besides putting in a baclofen pump, which patient does not want to do, there is not much more he can offer in terms of treatment.  - Remains on baclofen orally but it makes him feel little drunk and patient feels he doesn't walk as well on the medicine.  Flexeril does help him.   Elevated Tsh  Abnormality of Gait  Chronic Left Shoulder Pain   Patient has chronic left shoulder pain and had Dr. Roda Shutters from [facility] orthopedics evaluate him in the past.  He had a nerve transposition done by him as well.  Last shoulder MRI was done on 12\15\15.     h/o Clinical depression   Overview:  Overview:  12/31/2013: PHQ-9 score of 24; Sertraline  started & counseling referral initiated  Last Assessment & Plan:  Effects of the Sertraline will not be noticed for at least 3-4 weeks; if mood significantly worsens, contact the office  Contact information for Ollen Gross, PhD given to patient so he can schedule his own counseling appointment  [phone])  Return in one month      Wt Readings from Last 3 Encounters:  04/22/17 142 lb (64.4 kg)  01/02/17 149 lb 8 oz (67.8 kg)  12/13/16 146 lb (66.2 kg)   BP Readings from Last 3 Encounters:  04/22/17 119/82  01/02/17 128/84  12/13/16 105/70   Pulse Readings from Last 3 Encounters:  04/22/17 71  01/02/17 84  12/13/16 72   BMI Readings from Last 3 Encounters:  04/22/17 22.41 kg/m  01/02/17 23.59 kg/m  12/13/16 23.04 kg/m     Patient Care Team    Relationship Specialty Notifications Start End  [name], DO PCP - General Family Medicine  02/15/16   Tyrell Antonio, MD Consulting Physician Physical Medicine and Rehabilitation  05/22/16   York Spaniel, MD Consulting Physician Neurology  05/22/16   Specialists, [facility]  Orthopedic Surgery  12/11/16      Patient Active Problem List   Diagnosis Date Noted  . Statin intolerance 12/13/2016    Priority: High  . Restless legs syndrome (RLS) 02/15/2016    Priority: High  . Hyperlipidemia 12/18/2013    Priority: High  . 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 Elevated TSH 06/21/2016    Priority: Medium  . Vitamin D insufficiency 06/21/2016    Priority: Low  .  Abnormality of gait 07/09/2012    Priority: Low  . Chronic left shoulder pain 04/22/2017  . Strain of right hip adductor muscle 02/05/2017  . External hemorrhoids 05/18/2016  . Benign prostatic hyperplasia  L>R; without lower urinary tract symptoms 05/18/2016  . Health education/counseling 02/25/2016  . Erectile dysfunction 02/15/2016  . h/o CLinically Signficant Anxiety 01/05/2014  . h/o Clinical depression 12/31/2013  . Genital Herpes 11/18/2013  . Cannot sleep 11/18/2013  . Extremity pain 11/18/2013  . Abnormal findings on diagnostic imaging of abdomen 10/29/2013  . Polypharmacy 08/18/2013  . Other long term (current) drug therapy 08/18/2013  . Chronic pain 08/11/2013  . Other  specified disorders of urinary tract 07/09/2012  . Lumbago 07/09/2012  . Disturbance of skin sensation 07/09/2012  . 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    Current Meds  Medication Sig  . acyclovir (ZOVIRAX) 800 MG tablet TAKE 1 TABLET (800 MG TOTAL) BY MOUTH DAILY.  . baclofen (LIORESAL) 10 MG tablet Take 0.5 tablets (5 mg total) by mouth 3 (three) times daily.  . Cholecalciferol (VITAMIN D3) 5000 units TABS 5,000 IU OTC vitamin D3 daily.  . Cinnamon 500 MG capsule Take 1 capsule by mouth daily.  . cyclobenzaprine (FLEXERIL) 5 MG tablet Take 0.5 tablets (2.5 mg total) by mouth 3 (three) times daily as needed for muscle spasms.  . diazepam (VALIUM) 5 MG tablet Take 1-2 tablets (5-10 mg total) by mouth every 6 (six) hours as needed for muscle spasms.  . Omega-3 Fatty Acids (FISH OIL) 1000 MG CAPS Take 1 capsule by mouth daily.  Marland Kitchen rOPINIRole (REQUIP) 2 MG tablet Take 0.5 tablets (1 mg total) by mouth daily.  . Turmeric Curcumin 500 MG CAPS Take 1 capsule by mouth daily.    Allergies:  Allergies  Allergen Reactions  . Pregabalin   . Statins Other (See Comments)    Other reaction(s): Muscle Pain     Review of Systems: General:   Denies fever, chills, unexplained weight loss.  Optho/Auditory:   Denies visual changes, blurred vision/LOV Respiratory:   Denies wheeze, DOE more than baseline levels.  Cardiovascular:   Denies chest pain, palpitations, new onset peripheral edema  Gastrointestinal:   Denies nausea, vomiting, diarrhea, abd pain.  Genitourinary: Denies dysuria, freq/ urgency, flank pain or discharge from genitals.  Endocrine:     Denies hot or cold intolerance, polyuria, polydipsia. Musculoskeletal:   Denies unexplained myalgias, joint swelling, unexplained arthralgias, gait problems.   Skin:  Denies new onset rash, suspicious lesions Neurological:     Denies dizziness, unexplained weakness, numbness  Psychiatric/Behavioral:   Denies mood changes, suicidal or homicidal ideations, hallucinations    Objective:   Blood pressure 119/82, pulse 71, height 5' 6.75" (1.695 m), weight 142 lb (64.4 kg). Body mass index is 22.41 kg/m. General:  Well Developed, well nourished, appropriate for stated age.  Neuro:  Alert and oriented,  extra-ocular muscles intact  HEENT:  Normocephalic, atraumatic, neck supple, no carotid bruits appreciated  Skin:  no gross rash, warm, pink. Cardiac:  RRR, S1 S2 Respiratory:  ECTA B/L and A/P, Not using accessory muscles, speaking in full sentences- unlabored. Vascular:  Ext warm, no cyanosis apprec.; cap RF less 2 sec. Psych:  No HI/SI, judgement and insight good, Euthymic mood. Full Affect.  No results found for this or any previous visit (from the  past 2160 hour(s)).

## 2017-04-22 NOTE — Assessment & Plan Note (Signed)
Emotionally doing well.  Denies any need for medication or counseling.

## 2017-04-22 NOTE — Assessment & Plan Note (Signed)
-   Unable to tolerate and cause significant increase in his muscular pains and spastic paraparesis symptoms.

## 2017-04-22 NOTE — Assessment & Plan Note (Addendum)
-   Patient declines baclofen pump - Patient remain under treatment of Dr. Anne Hahn and declines further referral to [facility] or other specialists to see about other treatment options to help him with his ambulation and function

## 2017-04-24 ENCOUNTER — Ambulatory Visit (INDEPENDENT_AMBULATORY_CARE_PROVIDER_SITE_OTHER): Payer: 59

## 2017-04-24 ENCOUNTER — Ambulatory Visit (INDEPENDENT_AMBULATORY_CARE_PROVIDER_SITE_OTHER): Payer: 59 | Admitting: Physical Medicine and Rehabilitation

## 2017-04-24 ENCOUNTER — Encounter (INDEPENDENT_AMBULATORY_CARE_PROVIDER_SITE_OTHER): Payer: Self-pay | Admitting: Physical Medicine and Rehabilitation

## 2017-04-24 VITALS — BP 108/72 | HR 81

## 2017-04-24 DIAGNOSIS — M5416 Radiculopathy, lumbar region: Secondary | ICD-10-CM

## 2017-04-24 DIAGNOSIS — M5116 Intervertebral disc disorders with radiculopathy, lumbar region: Secondary | ICD-10-CM | POA: Diagnosis not present

## 2017-04-24 DIAGNOSIS — G8929 Other chronic pain: Secondary | ICD-10-CM | POA: Diagnosis not present

## 2017-04-24 DIAGNOSIS — G114 Hereditary spastic paraplegia: Secondary | ICD-10-CM | POA: Diagnosis not present

## 2017-04-24 DIAGNOSIS — M25512 Pain in left shoulder: Secondary | ICD-10-CM

## 2017-04-24 DIAGNOSIS — S39012A Strain of muscle, fascia and tendon of lower back, initial encounter: Secondary | ICD-10-CM | POA: Diagnosis not present

## 2017-04-24 NOTE — Progress Notes (Signed)
Terry Howard - 61 y.o. male MRN [id number]  Date of birth: 07/11/1956  Office Visit Note: Visit Date: 04/24/2017 PCP: [name], DO Referred by: [name], DO  Subjective: Chief Complaint  Patient presents with  . Lower Back - Pain   HPI: Terry Howard is a pleasant 61 year old gentleman that I know quite well over a few years but have not seen him in quite some time. He suffers from spastic hereditary paresis. He had a few of low back pain with some component of radicular type pain at times for many years. From a historical standpoint he was very interested in medication management and opioid therapy and has gone down that route in the past. He has a brother that had gone down that route after surgery. The medications never seemed to agree with him very much and he was on increasing doses of medication not only without relief and it was problematic. He has actually been seeing Dr. Roda Shutters in our office recently for orthopedic problems. He has had a cubital tunnel release performed by Dr. Roda Shutters. He also saw him in July when he encounter a muscle strain after inadvertently performing a splits while in physical therapy. He had been taking physical therapy this year quite extensively for some balance difficulties. He now comes in today with 2 specific complaints. He had a fall recently. He was working with a trailer. He does cut grass for a living. He says he fell right onto the trailer hitch onto his right side. He's been having right sided lower back pain and right anterior thigh pain. This was a recent fall just a few weeks ago. He did not report any paresthesias or weakness. He has not had any x-rays performed. He feels like the pain has gotten a little bit better but is still very problematic. He points to an area in the lower back and says a lot of pain when he pushes on the muscle. He has used some heat and ice. He has had anti-inflammatories as needed.  His second complaint is worsening  left shoulder pain. This did not result from a fall that is been a chronic worsening condition. He reports painful range of motion. He denies any numbness tingling or paresthesias in the arms or hands. He does have some neck pain but no frank radicular pain. He has no specific injury to the shoulder. He has had prior left shoulder rotator cuff surgery.    Review of Systems  Constitutional: Negative for chills, fever, malaise/fatigue and weight loss.  HENT: Negative for hearing loss and sinus pain.   Eyes: Negative for blurred vision, double vision and photophobia.  Respiratory: Negative for cough and shortness of breath.   Cardiovascular: Negative for chest pain, palpitations and leg swelling.  Gastrointestinal: Negative for abdominal pain, nausea and vomiting.  Genitourinary: Negative for flank pain.  Musculoskeletal: Positive for back pain, falls and joint pain. Negative for myalgias.  Skin: Negative for itching and rash.  Neurological: Negative for tremors, focal weakness and weakness.  Endo/Heme/Allergies: Negative.   Psychiatric/Behavioral: Negative for depression.  All other systems reviewed and are negative.  Otherwise per HPI.  Assessment & Plan: Visit Diagnoses:  1. Strain of lumbar region, initial encounter   2. Lumbar radiculopathy   3. Hereditary spastic paraparesis (HCC)   4. Radiculopathy due to lumbar intervertebral disc disorder     Plan: Findings:  1. Chronic long-term history of mechanical back pain problem with some history of radicular complaints at times. He  has had MRI evidence of degenerative disc changes disc herniation protrusions but nothing real focal enlargement. He does have some arthritis as well. In the past we completed radiofrequency ablation and he had done well with his back pain for a while. He really had not seen me for his back in quite some time. Today he presents more with an acute fall with contusion and sprain strain. We did obtain x-rays today of  the lumbar spine which showed no acute fractures. Still shows degenerative change at several that this levels without significant foraminal stenosis but with scoliosis. The scoliosis appears not to be worsening. I think at this point the best thing to do is a trigger point injection of the quadratus lumborum area on the left this seems to be reproducing his pain on exam. Also time and gentle stretching and using heat and ice. He continues over-the-counter anti-inflammatory for short-term. He has medication as muscle relaxers at home.  2. Chronic left shoulder pain status post shoulder rotator cuff surgery in the past. Does seem to be some impingement. I want to follow up with Dr. Roda Shutters who has been following from orthopedic standpoint. We will make an appointment to see him    Meds & Orders: No orders of the defined types were placed in this encounter.  Orders Placed This Encounter  Procedures  . XR Lumbar Spine 2-3 Views    Follow-up: No Follow-up on file.   Procedures: Trigger Point Inj Date/Time: 04/24/2017 11:25 AM Performed by: Tyrell Antonio Authorized by: Tyrell Antonio   Consent Given by:  Patient Indications:  Pain Total # of Trigger Points:  1 Location: back   Needle Size:  25 G Approach:  Dorsal Medications #1:  40 mg triamcinolone acetonide 40 MG/ML Comments: Trigger points palpated in the quadratus lumborum on the right. Needling technique was utilized.     No notes on file   Clinical History: MRI LUMBAR SPINE WITHOUT CONTRAST   Multiplanar multisequence imaging was performed. No plain films are available for correlation. Five lumbar type vertebral bodies are presumed with the last full intervertebral disc space labeled L5-S1.   The sagittal MR images demonstrate normal overall alignment. Vertebral bodies demonstrate normal marrow signal. Specifically, I do not see any abnormal increased signal intensity on the STIR sequence in the vertebral bodies, posterior elements,  or paraspinal musculature. The conus medullaris terminates at L1.   L1-2: There is a very shallow focal central disc protrusion, but no significant neural compression.  L2-3: No significant findings. There are mild facet degenerative changes.  L3-4: No significant findings.  L4-5: Annular rent and a shallow central disc protrusion which is somewhat broad-based and does cause impression on the anterior thecal sac. No foraminal stenosis or encroachment.  L5-S1: Mild facet disease, but no focal disc protrusion, spinal or foraminal stenosis.   IMPRESSION  1. Broad-based bulging annulus at L4-5 with a more central focal disc protrusion with mass effect on the central thecal sac.   2. Very shallow central disc protrusion at L1-2.  3. Annular rent at L3-4 without associated disc protrusion.   Provider: Thompson Grayer  He reports that he has never smoked. He has never used smokeless tobacco.  Recent Labs  05/16/16 1027  HGBA1C 4.7    Objective:  VS:  HT:    WT:   BMI:     BP:108/72  HR:81bpm  TEMP: ( )  RESP:  Physical Exam  Constitutional: He is oriented to person, place, and time. He appears  well-developed and well-nourished. No distress.  HENT:  Head: Normocephalic and atraumatic.  Nose: Nose normal.  Mouth/Throat: Oropharynx is clear and moist.  Eyes: Pupils are equal, round, and reactive to light. Conjunctivae are normal.  Neck: Normal range of motion. Neck supple. No tracheal deviation present.  Cardiovascular: Regular rhythm and intact distal pulses.   Pulmonary/Chest: Effort normal and breath sounds normal.  Abdominal: Soft. He exhibits no distension.  Musculoskeletal: He exhibits no deformity.  Patient ambulates with a cane. He does have spastic somewhat scissoring gait. He does have pain with extension of the lumbar spine. Observation of the lumbar spine does show mild scoliosis. He does have focal trigger point on the right lower back the quadratus lumborum area that does  reproduce most of his back pain. He has no pain over the greater trochanter and he has no pain with hip rotation. He has good distal strength. He does have increased spasticity which is a 3 a modified Ashworth scale.  Examination of his shoulder shows painful abduction and forward flexion. Rotation shows impingement signs. Positive Hawkins maneuver. He has good strength in the hands bilaterally with good wrist extension long finger flexion and abduction.  Lymphadenopathy:    He has no cervical adenopathy.  Neurological: He is alert and oriented to person, place, and time. He exhibits abnormal muscle tone. Coordination abnormal.  Skin: Skin is warm. No rash noted.  Psychiatric: He has a normal mood and affect. His behavior is normal.  Nursing note and vitals reviewed.   Ortho Exam Imaging: No results found.  Past Medical/Family/Surgical/Social History: Medications & Allergies reviewed per EMR Patient Active Problem List   Diagnosis Date Noted  . Chronic left shoulder pain 04/22/2017  . Strain of right hip adductor muscle 02/05/2017  . Statin intolerance 12/13/2016  . Vitamin D insufficiency 06/21/2016  . Elevated TSH 06/21/2016  . External hemorrhoids 05/18/2016  . Benign prostatic hyperplasia  L>R; without lower urinary tract symptoms 05/18/2016  . Health education/counseling 02/25/2016  . Restless legs syndrome (RLS) 02/15/2016  . Erectile dysfunction 02/15/2016  . h/o CLinically Signficant Anxiety 01/05/2014  . h/o Clinical depression 12/31/2013  . Hyperlipidemia 12/18/2013  . Genital Herpes 11/18/2013  . Cannot sleep 11/18/2013  . Extremity pain 11/18/2013  . Abnormal findings on diagnostic imaging of abdomen 10/29/2013  . 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 Polypharmacy 08/18/2013  . Other long term (current) drug therapy 08/18/2013  . Chronic pain 08/11/2013  . Other specified disorders of urinary tract 07/09/2012  . Abnormality of gait 07/09/2012  .  Lumbago 07/09/2012  . Disturbance of skin sensation 07/09/2012  . 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 Chronic insomnia   . Chronic low back pain    Annular tear at the L2-3 and L4-5 levels 4  . Gait disorder   . Hereditary spastic paraparesis (HCC)   . Hyperlipidemia   . 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 Neurogenic bladder   . RLS (restless legs syndrome)    Family History  Problem Relation Age of Onset  . Cancer Mother        breast cancer  . Other Mother   . Hyperlipidemia Mother   . Stroke Father        Intracranial hemorrhage  . Other Brother   . Other Brother   . Other Brother    Past Surgical History:  Procedure Laterality Date  . APPENDECTOMY    .  BREAST SURGERY Bilateral    cysts  . COLONOSCOPY    . KNEE ARTHROSCOPY     right  . SHOULDER SURGERY     Rotator cuff, left  . ULNAR TUNNEL RELEASE Left 09/10/2014   Procedure: LEFT CUBITAL TUNNEL RELEASE;  Surgeon: [name], MD;  Location: [facility];  Service: Orthopedics;  Laterality: Left;   Social History   Occupational History  . Unemployed     Disability   Social History Main Topics  . Smoking status: Never Smoker  . Smokeless tobacco: Never Used  . Alcohol use 4.2 oz/week    7 Cans of beer per week     Comment: Consumes alcohol on occasion  . Drug use: No  . Sexual activity: Yes    Birth control/ protection: Other-see comments     Comment: vasectomy

## 2017-04-24 NOTE — Progress Notes (Deleted)
Had a fall recently. Started having right sided lower back pain and right anterior thigh pain. Says walking is getting worse and has seen neurology about this. Is wondering if he should consider surgery for his. Pain is a little better now.

## 2017-04-29 ENCOUNTER — Encounter (INDEPENDENT_AMBULATORY_CARE_PROVIDER_SITE_OTHER): Payer: Self-pay | Admitting: Orthopaedic Surgery

## 2017-04-29 ENCOUNTER — Ambulatory Visit (INDEPENDENT_AMBULATORY_CARE_PROVIDER_SITE_OTHER): Payer: 59 | Admitting: Orthopaedic Surgery

## 2017-04-29 DIAGNOSIS — M7542 Impingement syndrome of left shoulder: Secondary | ICD-10-CM | POA: Diagnosis not present

## 2017-04-29 MED ORDER — LIDOCAINE HCL 1 % IJ SOLN
3.0000 mL | INTRAMUSCULAR | Status: AC | PRN
Start: 2017-04-29 — End: 2017-04-29
  Administered 2017-04-29: 3 mL

## 2017-04-29 MED ORDER — BUPIVACAINE HCL 0.5 % IJ SOLN
3.0000 mL | INTRAMUSCULAR | Status: AC | PRN
  Administered 2017-04-29: 3 mL via INTRA_ARTICULAR

## 2017-04-29 MED ORDER — METHYLPREDNISOLONE ACETATE 40 MG/ML IJ SUSP
40.0000 mg | INTRAMUSCULAR | Status: AC | PRN
  Administered 2017-04-29: 40 mg via INTRA_ARTICULAR

## 2017-04-29 NOTE — Progress Notes (Signed)
Office Visit Note   Patient: Terry Howard           Date of Birth: 05-Nov-1955           MRN: [id number] Visit Date: 04/29/2017              Requested by: [name], DO [address] [place], [place] [address] PCP: [name], DO   Assessment & Plan: Visit Diagnoses:  1. Impingement syndrome of left shoulder     Plan: Overall impression is left shoulder impingement syndrome. Subacromial injection was performed today. 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  Follow-Up Instructions: Return if symptoms worsen or fail to improve.   Orders:  No orders of the defined types were placed in this encounter.  No orders of the defined types were placed in this encounter.     Procedures: Large Joint Inj Date/Time: 04/29/2017 11:51 AM Performed by: Tarry Kos Authorized by: Tarry Kos   Consent Given by:  Patient Timeout: prior to procedure the correct patient, procedure, and site was verified   Location:  Shoulder Site:  L subacromial bursa Prep: patient was prepped and draped in usual sterile fashion   Needle Size:  22 G Approach:  Posterior Ultrasound Guidance: No   Fluoroscopic Guidance: No   Arthrogram: No   Medications:  3 mL lidocaine 1 %; 3 mL bupivacaine 0.5 %; 40 mg methylPREDNISolone acetate 40 MG/ML     Clinical Data: No additional findings.   Subjective: Chief Complaint  Patient presents with  . Left Shoulder - Pain    Patient follows up today for left shoulder pain. He mainly complains of pain when he reaches back and during use of the shoulder. Denies any numbness and tingling.    Review of Systems  Constitutional: Negative.   All other systems reviewed and are negative.    Objective: Vital Signs: There were no vitals taken for this visit.  Physical Exam  Constitutional: He is oriented to person, place, and  time. He appears well-developed and well-nourished.  Pulmonary/Chest: Effort normal.  Abdominal: Soft.  Neurological: He is alert and oriented to person, place, and time.  Skin: Skin is warm.  Psychiatric: He has a normal mood and affect. His behavior is normal. Judgment and thought content normal.  Nursing note and vitals reviewed.   Ortho Exam Left shoulder exam shows positive impingement signs. Rotator cuff is grossly intact. Negative O'Brien and negative crank test. Specialty Comments:  No specialty comments available.  Imaging: No results found.   PMFS History: Patient Active Problem List   Diagnosis Date Noted  . Impingement syndrome of left shoulder 04/29/2017  . Chronic left shoulder pain 04/22/2017  . Strain of right hip adductor muscle 02/05/2017  . Statin intolerance 12/13/2016  . Vitamin D insufficiency 06/21/2016  . Elevated TSH 06/21/2016  . External hemorrhoids 05/18/2016  . Benign prostatic hyperplasia  L>R; without lower urinary tract symptoms 05/18/2016  . Health education/counseling 02/25/2016  . Restless legs syndrome (RLS) 02/15/2016  . Erectile dysfunction 02/15/2016  . h/o CLinically Signficant Anxiety 01/05/2014  . h/o Clinical depression 12/31/2013  . Hyperlipidemia 12/18/2013  . Genital Herpes 11/18/2013  . Cannot sleep 11/18/2013  . Extremity pain 11/18/2013  . Abnormal findings on diagnostic imaging of abdomen 10/29/2013  . 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 Polypharmacy 08/18/2013  . Other long term (current) drug  therapy 08/18/2013  . Chronic pain 08/11/2013  . Other specified disorders of urinary tract 07/09/2012  . Abnormality of gait 07/09/2012  . Lumbago 07/09/2012  . Disturbance of skin sensation 07/09/2012  . 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 Chronic insomnia   . Chronic low back pain    Annular tear at the L2-3 and L4-5 levels 4  . Gait disorder   .  Hereditary spastic paraparesis (HCC)   . Hyperlipidemia   . 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 Neurogenic bladder   . RLS (restless legs syndrome)     Family History  Problem Relation Age of Onset  . Cancer Mother        breast cancer  . Other Mother   . Hyperlipidemia Mother   . Stroke Father        Intracranial hemorrhage  . Other Brother   . Other Brother   . Other Brother     Past Surgical History:  Procedure Laterality Date  . APPENDECTOMY    . BREAST SURGERY Bilateral    cysts  . COLONOSCOPY    . KNEE ARTHROSCOPY     right  . SHOULDER SURGERY     Rotator cuff, left  . ULNAR TUNNEL RELEASE Left 09/10/2014   Procedure: LEFT CUBITAL TUNNEL RELEASE;  Surgeon: [name], MD;  Location: [facility];  Service: Orthopedics;  Laterality: Left;   Social History   Occupational History  . Unemployed     Disability   Social History Main Topics  . Smoking status: Never Smoker  . Smokeless tobacco: Never Used  . Alcohol use 4.2 oz/week    7 Cans of beer per week     Comment: Consumes alcohol on occasion  . Drug use: No  . Sexual activity: Yes    Birth control/ protection: Other-see comments     Comment: vasectomy

## 2017-05-02 ENCOUNTER — Encounter (INDEPENDENT_AMBULATORY_CARE_PROVIDER_SITE_OTHER): Payer: Self-pay | Admitting: Physical Medicine and Rehabilitation

## 2017-05-02 MED ORDER — TRIAMCINOLONE ACETONIDE 40 MG/ML IJ SUSP
40.0000 mg | INTRAMUSCULAR | Status: AC | PRN
  Administered 2017-04-24: 40 mg via INTRAMUSCULAR

## 2017-05-02 MED FILL — ACYCLOVIR 800 MG TABLET: 800 | 90 days supply | Qty: 90 | Fill #1

## 2017-06-04 ENCOUNTER — Ambulatory Visit (INDEPENDENT_AMBULATORY_CARE_PROVIDER_SITE_OTHER): Payer: 59 | Admitting: Neurology

## 2017-06-04 ENCOUNTER — Ambulatory Visit (INDEPENDENT_AMBULATORY_CARE_PROVIDER_SITE_OTHER): Payer: 59 | Admitting: Family Medicine

## 2017-06-04 ENCOUNTER — Encounter: Payer: Self-pay | Admitting: Family Medicine

## 2017-06-04 ENCOUNTER — Encounter: Payer: Self-pay | Admitting: Neurology

## 2017-06-04 ENCOUNTER — Encounter: Payer: Self-pay | Admitting: Internal Medicine

## 2017-06-04 VITALS — BP 113/69 | HR 76 | Ht 66.75 in | Wt 143.0 lb

## 2017-06-04 VITALS — BP 117/80 | HR 76 | Ht 66.75 in | Wt 136.0 lb

## 2017-06-04 DIAGNOSIS — Z0000 Encounter for general adult medical examination without abnormal findings: Secondary | ICD-10-CM | POA: Diagnosis not present

## 2017-06-04 DIAGNOSIS — Z719 Counseling, unspecified: Secondary | ICD-10-CM

## 2017-06-04 DIAGNOSIS — G114 Hereditary spastic paraplegia: Secondary | ICD-10-CM | POA: Diagnosis not present

## 2017-06-04 DIAGNOSIS — Z1211 Encounter for screening for malignant neoplasm of colon: Secondary | ICD-10-CM | POA: Diagnosis not present

## 2017-06-04 DIAGNOSIS — R7989 Other specified abnormal findings of blood chemistry: Secondary | ICD-10-CM | POA: Diagnosis not present

## 2017-06-04 DIAGNOSIS — Z1389 Encounter for screening for other disorder: Secondary | ICD-10-CM | POA: Diagnosis not present

## 2017-06-04 DIAGNOSIS — E785 Hyperlipidemia, unspecified: Secondary | ICD-10-CM

## 2017-06-04 DIAGNOSIS — R269 Unspecified abnormalities of gait and mobility: Secondary | ICD-10-CM | POA: Diagnosis not present

## 2017-06-04 DIAGNOSIS — Z79899 Other long term (current) drug therapy: Secondary | ICD-10-CM

## 2017-06-04 DIAGNOSIS — G2581 Restless legs syndrome: Secondary | ICD-10-CM

## 2017-06-04 NOTE — Patient Instructions (Addendum)
Please get your colonoscopy updated.  We put in this referral today.   Please let me know if you change your mind regarding seeing a urologist for your concerns.   Please make an appointment for a dilated eye exam with an optometrist for dilated eye exam   Melissa, please also give patient VIS is on all vaccines that he is due for as he would like to review this with his RN wife prior to [place] dispensing them to him    Preventive Care for Adults, Male A healthy lifestyle and preventive care can promote health and wellness. Preventive health guidelines for men include the following key practices:  A routine yearly physical is a good way to check with your health care provider about your health and preventative screening. It is a chance to share any concerns and updates on your health and to receive a thorough exam.  Visit your dentist for a routine exam and preventative care every 6 months. Brush your teeth twice a day and floss once a day. Good oral hygiene prevents tooth decay and gum disease.  The frequency of eye exams is based on your age, health, family medical history, use of contact lenses, and other factors. Follow your health care provider's recommendations for frequency of eye exams.  Eat a healthy diet. Foods such as vegetables, fruits, whole grains, low-fat dairy products, and lean protein foods contain the nutrients you need without too many calories. Decrease your intake of foods high in solid fats, added sugars, and salt. Eat the right amount of calories for you.Get information about a proper diet from your health care provider, if necessary.  Regular physical exercise is one of the most important things you can do for your health. Most adults should get at least 150 minutes of moderate-intensity exercise (any activity that increases your heart rate and causes you to sweat) each week. In addition, most adults need muscle-strengthening exercises on 2 or more days a  week.  Maintain a healthy weight. The body mass index (BMI) is a screening tool to identify possible weight problems. It provides an estimate of body fat based on height and weight. Your health care provider can find your BMI and can help you achieve or maintain a healthy weight.For adults 20 years and older:  A BMI below 18.5 is considered underweight.  A BMI of 18.5 to 24.9 is normal.  A BMI of 25 to 29.9 is considered overweight.  A BMI of 30 and above is considered obese.  Maintain normal blood lipids and cholesterol levels by exercising and minimizing your intake of saturated fat. Eat a balanced diet with plenty of fruit and vegetables. Blood tests for lipids and cholesterol should begin at age 69 and be repeated every 5 years. If your lipid or cholesterol levels are high, you are over 50, or you are at high risk for heart disease, you may need your cholesterol levels checked more frequently.Ongoing high lipid and cholesterol levels should be treated with medicines if diet and exercise are not working.  If you smoke, find out from your health care provider how to quit. If you do not use tobacco, do not start.  Lung cancer screening is recommended for adults aged 26-80 years who are at high risk for developing lung cancer because of a history of smoking. A yearly low-dose CT scan of the lungs is recommended for people who have at least a 30-pack-year history of smoking and are a current smoker or have quit within the  past 15 years. A pack year of smoking is smoking an average of 1 pack of cigarettes a day for 1 year (for example: 1 pack a day for 30 years or 2 packs a day for 15 years). Yearly screening should continue until the smoker has stopped smoking for at least 15 years. Yearly screening should be stopped for people who develop a health problem that would prevent them from having lung cancer treatment.  If you choose to drink alcohol, do not have more than 2 drinks per day. One drink  is considered to be 12 ounces (355 mL) of beer, 5 ounces (148 mL) of wine, or 1.5 ounces (44 mL) of liquor.  Avoid use of street drugs. Do not share needles with anyone. Ask for help if you need support or instructions about stopping the use of drugs.  High blood pressure causes heart disease and increases the risk of stroke. Your blood pressure should be checked at least every 1-2 years. Ongoing high blood pressure should be treated with medicines, if weight loss and exercise are not effective.  If you are 18-45 years old, ask your health care provider if you should take aspirin to prevent heart disease.  Diabetes screening is done by taking a blood sample to check your blood glucose level after you have not eaten for a certain period of time (fasting). If you are not overweight and you do not have risk factors for diabetes, you should be screened once every 3 years starting at age 8. If you are overweight or obese and you are 80-65 years of age, you should be screened for diabetes every year as part of your cardiovascular risk assessment.  Colorectal cancer can be detected and often prevented. Most routine colorectal cancer screening begins at the age of 76 and continues through age 11. However, your health care provider may recommend screening at an earlier age if you have risk factors for colon cancer. On a yearly basis, your health care provider may provide home test kits to check for hidden blood in the stool. Use of a small camera at the end of a tube to directly examine the colon (sigmoidoscopy or colonoscopy) can detect the earliest forms of colorectal cancer. Talk to your health care provider about this at age 53, when routine screening begins. Direct exam of the colon should be repeated every 5-10 years through age 50, unless early forms of precancerous polyps or small growths are found.  People who are at an increased risk for hepatitis B should be screened for this virus. You are considered  at high risk for hepatitis B if:  You were born in a country where hepatitis B occurs often. Talk with your health care provider about which countries are considered high risk.  Your parents were born in a high-risk country and you have not received a shot to protect against hepatitis B (hepatitis B vaccine).  You have HIV or AIDS.  You use needles to inject street drugs.  You live with, or have sex with, someone who has hepatitis B.  You are a man who has sex with other men (MSM).  You get hemodialysis treatment.  You take certain medicines for conditions such as cancer, organ transplantation, and autoimmune conditions.  Hepatitis C blood testing is recommended for all people born from [birth year] through 1965 and any individual with known risks for hepatitis C.  Practice safe sex. Use condoms and avoid high-risk sexual practices to reduce the spread of sexually transmitted infections (  STIs). STIs include gonorrhea, chlamydia, syphilis, trichomonas, herpes, HPV, and human immunodeficiency virus (HIV). Herpes, HIV, and HPV are viral illnesses that have no cure. They can result in disability, cancer, and death.  If you are a man who has sex with other men, you should be screened at least once per year for:  HIV.  Urethral, rectal, and pharyngeal infection of gonorrhea, chlamydia, or both.  If you are at risk of being infected with HIV, it is recommended that you take a prescription medicine daily to prevent HIV infection. This is called preexposure prophylaxis (PrEP). You are considered at risk if:  You are a man who has sex with other men (MSM) and have other risk factors.  You are a heterosexual man, are sexually active, and are at increased risk for HIV infection.  You take drugs by injection.  You are sexually active with a partner who has HIV.  Talk with your health care provider about whether you are at high risk of being infected with HIV. If you choose to begin PrEP, you should  first be tested for HIV. You should then be tested every 3 months for as long as you are taking PrEP.  A one-time screening for abdominal aortic aneurysm (AAA) and surgical repair of large AAAs by ultrasound are recommended for men ages 61 to 55 years who are current or former smokers.  Healthy men should no longer receive prostate-specific antigen (PSA) blood tests as part of routine cancer screening. Talk with your health care provider about prostate cancer screening.  Testicular cancer screening is not recommended for adult males who have no symptoms. Screening includes self-exam, a health care provider exam, and other screening tests. Consult with your health care provider about any symptoms you have or any concerns you have about testicular cancer.  Use sunscreen. Apply sunscreen liberally and repeatedly throughout the day. You should seek shade when your shadow is shorter than you. Protect yourself by wearing long sleeves, pants, a wide-brimmed hat, and sunglasses year round, whenever you are outdoors.  Once a month, do a whole-body skin exam, using a mirror to look at the skin on your back. Tell your health care provider about new moles, moles that have irregular borders, moles that are larger than a pencil eraser, or moles that have changed in shape or color.  Stay current with required vaccines (immunizations).  Influenza vaccine. All adults should be immunized every year.  Tetanus, diphtheria, and acellular pertussis (Td, Tdap) vaccine. An adult who has not previously received Tdap or who does not know his vaccine status should receive 1 dose of Tdap. This initial dose should be followed by tetanus and diphtheria toxoids (Td) booster doses every 10 years. Adults with an unknown or incomplete history of completing a 3-dose immunization series with Td-containing vaccines should begin or complete a primary immunization series including a Tdap dose. Adults should receive a Td booster every 10  years.  Varicella vaccine. An adult without evidence of immunity to varicella should receive 2 doses or a second dose if he has previously received 1 dose.  Human papillomavirus (HPV) vaccine. Males aged 11-21 years who have not received the vaccine previously should receive the 3-dose series. Males aged 22-26 years may be immunized. Immunization is recommended through the age of 26 years for any male who has sex with males and did not get any or all doses earlier. Immunization is recommended for any person with an immunocompromised condition through the age of 58 years if  he did not get any or all doses earlier. During the 3-dose series, the second dose should be obtained 4-8 weeks after the first dose. The third dose should be obtained 24 weeks after the first dose and 16 weeks after the second dose.  Zoster vaccine. One dose is recommended for adults aged 32 years or older unless certain conditions are present.  Measles, mumps, and rubella (MMR) vaccine. Adults born before [birth year] generally are considered immune to measles and mumps. Adults born in [birth year] or later should have 1 or more doses of MMR vaccine unless there is a contraindication to the vaccine or there is laboratory evidence of immunity to each of the three diseases. A routine second dose of MMR vaccine should be obtained at least 28 days after the first dose for students attending postsecondary schools, health care workers, or international travelers. People who received inactivated measles vaccine or an unknown type of measles vaccine during 1963-1967 should receive 2 doses of MMR vaccine. People who received inactivated mumps vaccine or an unknown type of mumps vaccine before 1979 and are at high risk for mumps infection should consider immunization with 2 doses of MMR vaccine. Unvaccinated health care workers born before [birth year] who lack laboratory evidence of measles, mumps, or rubella immunity or laboratory confirmation of disease should  consider measles and mumps immunization with 2 doses of MMR vaccine or rubella immunization with 1 dose of MMR vaccine.  Pneumococcal 13-valent conjugate (PCV13) vaccine. When indicated, a person who is uncertain of his immunization history and has no record of immunization should receive the PCV13 vaccine. All adults 79 years of age and older should receive this vaccine. An adult aged 42 years or older who has certain medical conditions and has not been previously immunized should receive 1 dose of PCV13 vaccine. This PCV13 should be followed with a dose of pneumococcal polysaccharide (PPSV23) vaccine. Adults who are at high risk for pneumococcal disease should obtain the PPSV23 vaccine at least 8 weeks after the dose of PCV13 vaccine. Adults older than 61 years of age who have normal immune system function should obtain the PPSV23 vaccine dose at least 1 year after the dose of PCV13 vaccine.  Pneumococcal polysaccharide (PPSV23) vaccine. When PCV13 is also indicated, PCV13 should be obtained first. All adults aged 54 years and older should be immunized. An adult younger than age 25 years who has certain medical conditions should be immunized. Any person who resides in a nursing home or long-term care facility should be immunized. An adult smoker should be immunized. People with an immunocompromised condition and certain other conditions should receive both PCV13 and PPSV23 vaccines. People with human immunodeficiency virus (HIV) infection should be immunized as soon as possible after diagnosis. Immunization during chemotherapy or radiation therapy should be avoided. Routine use of PPSV23 vaccine is not recommended for American Indians, [place] Natives, or people younger than 65 years unless there are medical conditions that require PPSV23 vaccine. When indicated, people who have unknown immunization and have no record of immunization should receive PPSV23 vaccine. One-time revaccination 5 years after the first  dose of PPSV23 is recommended for people aged 19-64 years who have chronic kidney failure, nephrotic syndrome, asplenia, or immunocompromised conditions. People who received 1-2 doses of PPSV23 before age 19 years should receive another dose of PPSV23 vaccine at age 23 years or later if at least 5 years have passed since the previous dose. Doses of PPSV23 are not needed for people immunized with PPSV23 at or  after age 88 years.  Meningococcal vaccine. Adults with asplenia or persistent complement component deficiencies should receive 2 doses of quadrivalent meningococcal conjugate (MenACWY-D) vaccine. The doses should be obtained at least 2 months apart. Microbiologists working with certain meningococcal bacteria, [place] recruits, people at risk during an outbreak, and people who travel to or live in countries with a high rate of meningitis should be immunized. A first-year college student up through age 20 years who is living in a residence hall should receive a dose if he did not receive a dose on or after his 16th birthday. Adults who have certain high-risk conditions should receive one or more doses of vaccine.  Hepatitis A vaccine. Adults who wish to be protected from this disease, have chronic liver disease, work with hepatitis A-infected animals, work in hepatitis A research labs, or travel to or work in countries with a high rate of hepatitis A should be immunized. Adults who were previously unvaccinated and who anticipate close contact with an international adoptee during the first 60 days after arrival in the [place] from a country with a high rate of hepatitis A should be immunized.  Hepatitis B vaccine. Adults should be immunized if they wish to be protected from this disease, are under age 94 years and have diabetes, have chronic liver disease, have had more than one sex partner in the past 6 months, may be exposed to blood or other infectious body fluids, are household contacts or sex  partners of hepatitis B positive people, are clients or workers in certain care facilities, or travel to or work in countries with a high rate of hepatitis B.  Haemophilus influenzae type b (Hib) vaccine. A previously unvaccinated person with asplenia or sickle cell disease or having a scheduled splenectomy should receive 1 dose of Hib vaccine. Regardless of previous immunization, a recipient of a hematopoietic stem cell transplant should receive a 3-dose series 6-12 months after his successful transplant. Hib vaccine is not recommended for adults with HIV infection. Preventive Service / Frequency Ages 38 to 47  Blood pressure check.** / Every 3-5 years.  Lipid and cholesterol check.** / Every 5 years beginning at age 53.  Hepatitis C blood test.** / For any individual with known risks for hepatitis C.  Skin self-exam. / Monthly.  Influenza vaccine. / Every year.  Tetanus, diphtheria, and acellular pertussis (Tdap, Td) vaccine.** / Consult your health care provider. 1 dose of Td every 10 years.  Varicella vaccine.** / Consult your health care provider.  HPV vaccine. / 3 doses over 6 months, if 41 or younger.  Measles, mumps, rubella (MMR) vaccine.** / You need at least 1 dose of MMR if you were born in 1957 or later. You may also need a second dose.  Pneumococcal 13-valent conjugate (PCV13) vaccine.** / Consult your health care provider.  Pneumococcal polysaccharide (PPSV23) vaccine.** / 1 to 2 doses if you smoke cigarettes or if you have certain conditions.  Meningococcal vaccine.** / 1 dose if you are age 60 to 63 years and a [employer] researcher living in a residence hall, or have one of several medical conditions. You may also need additional booster doses.  Hepatitis A vaccine.** / Consult your health care provider.  Hepatitis B vaccine.** / Consult your health care provider.  Haemophilus influenzae type b (Hib) vaccine.** / Consult your health care provider. Ages 73 to  26  Blood pressure check.** / Every year.  Lipid and cholesterol check.** / Every 5 years beginning at age  20.  Lung cancer screening. / Every year if you are aged 19-80 years and have a 30-pack-year history of smoking and currently smoke or have quit within the past 15 years. Yearly screening is stopped once you have quit smoking for at least 15 years or develop a health problem that would prevent you from having lung cancer treatment.  Fecal occult blood test (FOBT) of stool. / Every year beginning at age 45 and continuing until age 76. You may not have to do this test if you get a colonoscopy every 10 years.  Flexible sigmoidoscopy** or colonoscopy.** / Every 5 years for a flexible sigmoidoscopy or every 10 years for a colonoscopy beginning at age 56 and continuing until age 93.  Hepatitis C blood test.** / For all people born from [birth year] through 1965 and any individual with known risks for hepatitis C.  Skin self-exam. / Monthly.  Influenza vaccine. / Every year.  Tetanus, diphtheria, and acellular pertussis (Tdap/Td) vaccine.** / Consult your health care provider. 1 dose of Td every 10 years.  Varicella vaccine.** / Consult your health care provider.  Zoster vaccine.** / 1 dose for adults aged 17 years or older.  Measles, mumps, rubella (MMR) vaccine.** / You need at least 1 dose of MMR if you were born in 1957 or later. You may also need a second dose.  Pneumococcal 13-valent conjugate (PCV13) vaccine.** / Consult your health care provider.  Pneumococcal polysaccharide (PPSV23) vaccine.** / 1 to 2 doses if you smoke cigarettes or if you have certain conditions.  Meningococcal vaccine.** / Consult your health care provider.  Hepatitis A vaccine.** / Consult your health care provider.  Hepatitis B vaccine.** / Consult your health care provider.  Haemophilus influenzae type b (Hib) vaccine.** / Consult your health care provider. Ages 51 and over  Blood pressure check.** /  Every year.  Lipid and cholesterol check.**/ Every 5 years beginning at age 107.  Lung cancer screening. / Every year if you are aged 57-80 years and have a 30-pack-year history of smoking and currently smoke or have quit within the past 15 years. Yearly screening is stopped once you have quit smoking for at least 15 years or develop a health problem that would prevent you from having lung cancer treatment.  Fecal occult blood test (FOBT) of stool. / Every year beginning at age 14 and continuing until age 55. You may not have to do this test if you get a colonoscopy every 10 years.  Flexible sigmoidoscopy** or colonoscopy.** / Every 5 years for a flexible sigmoidoscopy or every 10 years for a colonoscopy beginning at age 28 and continuing until age 52.  Hepatitis C blood test.** / For all people born from [birth year] through 1965 and any individual with known risks for hepatitis C.  Abdominal aortic aneurysm (AAA) screening.** / A one-time screening for ages 30 to 77 years who are current or former smokers.  Skin self-exam. / Monthly.  Influenza vaccine. / Every year.  Tetanus, diphtheria, and acellular pertussis (Tdap/Td) vaccine.** / 1 dose of Td every 10 years.  Varicella vaccine.** / Consult your health care provider.  Zoster vaccine.** / 1 dose for adults aged 61 years or older.  Pneumococcal 13-valent conjugate (PCV13) vaccine.** / 1 dose for all adults aged 67 years and older.  Pneumococcal polysaccharide (PPSV23) vaccine.** / 1 dose for all adults aged 38 years and older.  Meningococcal vaccine.** / Consult your health care provider.  Hepatitis A vaccine.** / Consult your health care provider.  Hepatitis B vaccine.** / Consult your health care provider.  Haemophilus influenzae type b (Hib) vaccine.** / Consult your health care provider. **Family history and personal history of risk and conditions may change your health care provider's recommendations.   This information is not  intended to replace advice given to you by your health care provider. Make sure you discuss any questions you have with your health care provider.   Document Released: 09/18/2001 Document Revised: 08/13/2014 Document Reviewed: 12/18/2010 Elsevier Interactive Patient Education [employer].

## 2017-06-04 NOTE — Progress Notes (Signed)
Reason for visit: Hereditary spastic paraparesis  Terry CyphersSteven M Howard is an 61 y.o. 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  The patient drags his toes when walking, he was set up for physical therapy and an evaluation for AFO braces but the AFO braces did not help him.  The patient has had an occasional fall, he usually uses a cane for ambulation, he does have a walker as well.  The patient has restless leg syndrome, he takes Requip in the evening hours and he takes baclofen 5 mg at night.  He does not take baclofen during the day as it causes too much drowsiness.  He returns for an evaluation today.  Past Medical History:  Diagnosis Date  . Chronic insomnia   . Chronic low back pain    Annular tear at the L2-3 and L4-5 levels 4  . Gait disorder   . Hereditary spastic paraparesis (HCC)   . Hyperlipidemia   . 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 Neurogenic bladder   . RLS (restless legs syndrome)     Past Surgical History:  Procedure Laterality Date  . APPENDECTOMY    . BREAST SURGERY Bilateral    cysts  . COLONOSCOPY    . KNEE ARTHROSCOPY     right  . SHOULDER SURGERY     Rotator cuff, left  . ULNAR TUNNEL RELEASE Left 09/10/2014   Procedure: LEFT CUBITAL TUNNEL RELEASE;  Surgeon: Cheral AlmasNaiping Michael Xu, MD;  Location: [facility];  Service: Orthopedics;  Laterality: Left;    Family History  Problem Relation Age of Onset  . Cancer Mother        breast cancer  . Other Mother   . Hyperlipidemia Mother   . Stroke Father        Intracranial hemorrhage  . Other Brother   . Other Brother   . Other Brother     Social history:  reports that he has never smoked. He has never used smokeless tobacco. He reports that he drinks about 4.2 oz of alcohol per week . He reports that he does not use drugs.    Allergies  Allergen  Reactions  . Pregabalin   . Statins Other (See Comments)    Other reaction(s): Muscle Pain    Medications:  Prior to Admission medications   Medication Sig Start Date End Date Taking? Authorizing Provider  acyclovir (ZOVIRAX) 800 MG tablet TAKE 1 TABLET (800 MG TOTAL) BY MOUTH DAILY. 01/28/17  Yes Opalski, Gavin Poundeborah, DO  baclofen (LIORESAL) 10 MG tablet Take 0.5 tablets (5 mg total) by mouth 3 (three) times daily. 01/02/17  Yes York SpanielWillis, Zeinab Rodwell K, MD  Cholecalciferol (VITAMIN D3) 5000 units TABS 5,000 IU OTC vitamin D3 daily. 06/21/16  Yes Opalski, Deborah, DO  Cinnamon 500 MG capsule Take 1 capsule by mouth daily.   Yes [provider]  cyclobenzaprine (FLEXERIL) 5 MG tablet Take 0.5 tablets (2.5 mg total) by mouth 3 (three) times daily as needed for muscle spasms. 12/13/16  Yes Opalski, Deborah, DO  Omega-3 Fatty Acids (FISH OIL) 1000 MG CAPS Take 1 capsule by mouth daily.   Yes [provider]  rOPINIRole (REQUIP) 2 MG tablet Take 0.5 tablets (1 mg total) by mouth daily. 12/13/16  Yes Opalski, Gavin Poundeborah, DO  Turmeric Curcumin 500 MG CAPS Take 1 capsule by mouth daily.  Yes [provider]    ROS:  Out of a complete 14 system review of symptoms, the patient complains only of the following symptoms, and all other reviewed systems are negative.  Restless legs, snoring Walking difficulty  Blood pressure 113/69, pulse 76, height 5' 6.75" (1.695 m), weight 143 lb (64.9 kg).  Physical Exam  General: The patient is alert and cooperative at the time of the examination.  Skin: No significant peripheral edema is noted.   Neurologic Exam  Mental status: The patient is alert and oriented x 3 at the time of the examination. The patient has apparent normal recent and remote memory, with an apparently normal attention span and concentration ability.   Cranial nerves: Facial symmetry is present. Speech is normal, no aphasia or dysarthria is noted. Extraocular movements  are full. Visual fields are full.  Motor: The patient has good strength in the upper extremities.  With the lower extremities patient has 4/5 strength with hip flexion bilaterally otherwise has fairly good strength throughout the legs.  Sensory examination: Soft touch sensation is symmetric on the face, arms, and legs.  Coordination: The patient has good finger-nose-finger bilaterally, but he has some difficulty performing heel to shin bilaterally.  Gait and station: The patient has a diplegic gait.  The patient drags his toes with walking, he uses a cane for ambulation.  Reflexes: Deep tendon reflexes are symmetric, but are somewhat brisk in the legs.   Assessment/Plan:  1.  Hereditary spastic paraparesis  2.  Gait disorder  The patient did not gain benefit from the AFO brace.  He will take baclofen 10 mg at night for the leg spasticity and restless leg symptoms.  The patient may need to convert to hand controls with his motor vehicle in the near future.  He will follow-up through this office in about 9 months.  Marlan Palau MD 06/04/2017 12:01 PM  [facility] [address] [place], [place] [address]  Phone [phone] Fax [phone]

## 2017-06-04 NOTE — Progress Notes (Signed)
Male physical  Impression and Recommendations:    1. Encounter for wellness examination   2. Health education/counseling   3. Screening for multiple conditions   4. Hereditary spastic paraparesis (HCC)   5. Hyperlipidemia, unspecified hyperlipidemia type   6. Elevated TSH   7. High risk medication use   8. Screening for colon cancer    1) labs today  Orders Placed This Encounter  Procedures  . Comprehensive metabolic panel    Order Specific Question:   Has the patient fasted?    Answer:   Yes  . Magnesium  . Phosphorus  . TSH  . Vitamin B12  . Lipid panel    Order Specific Question:   Has the patient fasted?    Answer:   Yes  . Hemoglobin A1c  . Ambulatory referral to Gastroenterology    Referral Priority:   Routine    Referral Type:   Consultation    Referral Reason:   Specialty Services Required    Number of Visits Requested:   1     No problem-specific Assessment & Plan notes found for this encounter.    Patient's Medications  New Prescriptions   No medications on file  Previous Medications   ACYCLOVIR (ZOVIRAX) 800 MG TABLET    TAKE 1 TABLET (800 MG TOTAL) BY MOUTH DAILY.   BACLOFEN (LIORESAL) 10 MG TABLET    Take 0.5 tablets (5 mg total) by mouth 3 (three) times daily.   CHOLECALCIFEROL (VITAMIN D3) 5000 UNITS TABS    5,000 IU OTC vitamin D3 daily.   CINNAMON 500 MG CAPSULE    Take 1 capsule by mouth daily.   CYCLOBENZAPRINE (FLEXERIL) 5 MG TABLET    Take 0.5 tablets (2.5 mg total) by mouth 3 (three) times daily as needed for muscle spasms.   DIAZEPAM (VALIUM) 5 MG TABLET    Take 1-2 tablets (5-10 mg total) by mouth every 6 (six) hours as needed for muscle spasms.   OMEGA-3 FATTY ACIDS (FISH OIL) 1000 MG CAPS    Take 1 capsule by mouth daily.   ROPINIROLE (REQUIP) 2 MG TABLET    Take 0.5 tablets (1 mg total) by mouth daily.   TURMERIC CURCUMIN 500 MG CAPS    Take 1 capsule by mouth daily.  Modified Medications   No medications on file  Discontinued  Medications   No medications on file     Please see AVS handed out to patient at the end of our visit for further patient instructions/ counseling done pertaining to today's office visit.  1) Anticipatory Guidance: Discussed importance of wearing a seatbelt while driving, not texting while driving;   sunscreen when outside along with skin surveillance; eating a balanced and modest diet; physical activity at least 25 minutes per day or 150 min/ week moderate to intense activity.  2) Immunizations / Screenings / Labs:  All immunizations are up-to-date per recommendations or will be updated today. Patient is due for dental and vision screens which pt will schedule independently. Will obtain CBC, CMP, HgA1c, Lipid panel, TSH and vit D when fasting, if not already done recently.   Follow-up preventative CPE in 1 year. Follow-up office visit pending lab work.  F/up sooner for chronic care management and/or prn    Subjective:    CC: CPE  HPI: Terry Howard is a 61 y.o. male who presents to [facility] today for a yearly health maintenance exam.    Health Maintenance  Summary Reviewed and updated, unless pt declines services.  Pt has concerns w ED--  In past used viagra and also shots in penis through clinic in [place]- not interested in going to urology. He will think about it.   Aspirin: administering 81 mg daily Colonoscopy:     (Unnecessary secondary to < 6450 or > 33105 years old.) Tdap: needs updating, will give VIS. Pneumovax/PPSV23: Patient declined will be given VIS Zostavax/ Shingrix:    Postponed- pt declined Tobacco History Reviewed:   Yes- none CT scan for screening lung CA:   n/a Alcohol:    No concerns, no excessive use Exercise Habits:   3 times wkly- pt exercises with wt lifting upper body STD concerns:   none Drug Use:   None Birth control method:   n/a Testicular/penile concerns:     None excpt for ED-declines urology referral   Health  Maintenance  Topic Date Due  . COLONOSCOPY  12/04/2017 (Originally 08/06/2016)  . INFLUENZA VACCINE  05/06/2018 (Originally 03/06/2017)  . TETANUS/TDAP  08/06/2018  . Hepatitis C Screening  Completed  . HIV Screening  Completed    Wt Readings from Last 3 Encounters:  06/04/17 136 lb (61.7 kg)  04/22/17 142 lb (64.4 kg)  01/02/17 149 lb 8 oz (67.8 kg)   BP Readings from Last 3 Encounters:  06/04/17 117/80  04/24/17 108/72  04/22/17 119/82   Pulse Readings from Last 3 Encounters:  06/04/17 76  04/24/17 81  04/22/17 71    Patient Active Problem List   Diagnosis Date Noted  . Statin intolerance 12/13/2016    Priority: High  . Restless legs syndrome (RLS) 02/15/2016    Priority: High  . Hyperlipidemia 12/18/2013    Priority: High  . 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 Elevated TSH 06/21/2016    Priority: Medium  . Vitamin D insufficiency 06/21/2016    Priority: Low  . Abnormality of gait 07/09/2012    Priority: Low  . Impingement syndrome of left shoulder 04/29/2017  . Chronic left shoulder pain 04/22/2017  . Strain of right hip adductor muscle 02/05/2017  . External hemorrhoids 05/18/2016  . Benign prostatic hyperplasia  L>R; without lower urinary tract symptoms 05/18/2016  . Health education/counseling 02/25/2016  . Erectile dysfunction 02/15/2016  . h/o CLinically Signficant Anxiety 01/05/2014  . h/o Clinical depression 12/31/2013  . Genital Herpes 11/18/2013  . Cannot sleep 11/18/2013  . Extremity pain 11/18/2013  . Abnormal findings on diagnostic imaging of abdomen 10/29/2013  . Polypharmacy 08/18/2013  . Other long term (current) drug therapy 08/18/2013  . Chronic pain 08/11/2013  . Other specified disorders of urinary tract 07/09/2012  . Lumbago 07/09/2012  . Disturbance of skin sensation 07/09/2012  . 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 Chronic insomnia   . Chronic  low back pain    Annular tear at the L2-3 and L4-5 levels 4  . Gait disorder   . Hereditary spastic paraparesis (HCC)   . Hyperlipidemia   . 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 Neurogenic bladder   . RLS (restless legs syndrome)     Past Surgical History:  Procedure Laterality Date  . APPENDECTOMY    . BREAST SURGERY Bilateral    cysts  . COLONOSCOPY    . KNEE ARTHROSCOPY     right  . SHOULDER SURGERY     Rotator cuff, left  . ULNAR TUNNEL  RELEASE Left 09/10/2014   Procedure: LEFT CUBITAL TUNNEL RELEASE;  Surgeon: [name], MD;  Location: [facility];  Service: Orthopedics;  Laterality: Left;    Family History  Problem Relation Age of Onset  . Cancer Mother        breast cancer  . Other Mother   . Hyperlipidemia Mother   . Stroke Father        Intracranial hemorrhage  . Other Brother   . Other Brother   . Other Brother     History  Drug Use No  ,  History  Alcohol Use  . 4.2 oz/week  . 7 Cans of beer per week    Comment: Consumes alcohol on occasion  ,  History  Smoking Status  . Never Smoker  Smokeless Tobacco  . Never Used  ,  History  Sexual Activity  . Sexual activity: Yes  . Birth control/ protection: Other-see comments    Comment: vasectomy    Patient's Medications  New Prescriptions   No medications on file  Previous Medications   ACYCLOVIR (ZOVIRAX) 800 MG TABLET    TAKE 1 TABLET (800 MG TOTAL) BY MOUTH DAILY.   BACLOFEN (LIORESAL) 10 MG TABLET    Take 0.5 tablets (5 mg total) by mouth 3 (three) times daily.   CHOLECALCIFEROL (VITAMIN D3) 5000 UNITS TABS    5,000 IU OTC vitamin D3 daily.   CINNAMON 500 MG CAPSULE    Take 1 capsule by mouth daily.   CYCLOBENZAPRINE (FLEXERIL) 5 MG TABLET    Take 0.5 tablets (2.5 mg total) by mouth 3 (three) times daily as needed for muscle spasms.   DIAZEPAM (VALIUM) 5 MG TABLET    Take 1-2 tablets (5-10 mg total) by mouth every 6 (six) hours as needed for muscle spasms.    OMEGA-3 FATTY ACIDS (FISH OIL) 1000 MG CAPS    Take 1 capsule by mouth daily.   ROPINIROLE (REQUIP) 2 MG TABLET    Take 0.5 tablets (1 mg total) by mouth daily.   TURMERIC CURCUMIN 500 MG CAPS    Take 1 capsule by mouth daily.  Modified Medications   No medications on file  Discontinued Medications   No medications on file    Pregabalin and Statins  Review of Systems: General:   Denies fever, chills, unexplained weight loss.  Optho/Auditory:   Denies visual changes, blurred vision/LOV Respiratory:   Denies SOB, DOE more than baseline levels.  Cardiovascular:   Denies chest pain, palpitations, new onset peripheral edema  Gastrointestinal:   Denies nausea, vomiting, diarrhea.  Genitourinary: Denies dysuria, freq/ urgency, flank pain or discharge from genitals.  Endocrine:     Denies hot or cold intolerance, polyuria, polydipsia. Musculoskeletal:   Denies unexplained myalgias, joint swelling, unexplained arthralgias, gait problems.  Skin:  Denies rash, suspicious lesions Neurological:     Denies dizziness, unexplained weakness, numbness  Psychiatric/Behavioral:   Denies mood changes, suicidal or homicidal ideations, hallucinations    Objective:     Blood pressure 117/80, pulse 76, height 5' 6.75" (1.695 m), weight 136 lb (61.7 kg). Body mass index is 21.46 kg/m. General Appearance:    Alert, cooperative, no distress, appears stated age  Head:    Normocephalic, without obvious abnormality, atraumatic  Eyes:    PERRL, conjunctiva/corneas clear, EOM's intact, fundi    benign, both eyes  Ears:    Normal TM's and external ear canals, both ears  Nose:   Nares normal, septum midline, mucosa normal, no  drainage    or sinus tenderness  Throat:   Lips w/o lesion, mucosa moist, and tongue normal; teeth and   gums normal  Neck:   Supple, symmetrical, trachea midline, no adenopathy;    thyroid:  no enlargement/tenderness/nodules; no carotid   bruit or JVD  Back:     Symmetric, no  curvature, ROM normal, no CVA tenderness  Lungs:     Clear to auscultation bilaterally, respirations unlabored, no       Wh/ R/ R  Chest Wall:    No tenderness or gross deformity; normal excursion   Heart:    Regular rate and rhythm, S1 and S2 normal, no murmur, rub   or gallop  Abdomen:     Soft, non-tender, bowel sounds active all four quadrants, NO   G/R/R, no masses, no organomegaly  Genitalia:    Ext genitalia: without lesion, no penile rash or discharge, no hernias appreciated   Rectal:   deferred  Extremities:   Extremities normal, atraumatic, no cyanosis or gross edema  Pulses:   2+ and symmetric all extremities  Skin:   Warm, dry, Skin color, texture, turgor normal, no obvious rashes or lesions  M-Sk:   Ambulates * 4 w/o difficulty, no gross deformities, tone WNL  Neurologic:   CNII-XII intact, normal strength, sensation and reflexes    Throughout Psych:  No HI/SI, judgement and insight good, Euthymic mood. Full Affect.

## 2017-06-04 NOTE — Patient Instructions (Signed)
   Take 10 mg of baclofen at night for the legs in the evening.

## 2017-06-11 ENCOUNTER — Other Ambulatory Visit (INDEPENDENT_AMBULATORY_CARE_PROVIDER_SITE_OTHER): Payer: 59

## 2017-06-11 DIAGNOSIS — E785 Hyperlipidemia, unspecified: Secondary | ICD-10-CM

## 2017-06-11 DIAGNOSIS — G8929 Other chronic pain: Secondary | ICD-10-CM | POA: Diagnosis not present

## 2017-06-11 DIAGNOSIS — R7989 Other specified abnormal findings of blood chemistry: Secondary | ICD-10-CM

## 2017-06-11 DIAGNOSIS — E559 Vitamin D deficiency, unspecified: Secondary | ICD-10-CM

## 2017-06-11 DIAGNOSIS — G2581 Restless legs syndrome: Secondary | ICD-10-CM | POA: Diagnosis not present

## 2017-06-11 DIAGNOSIS — N400 Enlarged prostate without lower urinary tract symptoms: Secondary | ICD-10-CM | POA: Diagnosis not present

## 2017-06-11 DIAGNOSIS — G114 Hereditary spastic paraplegia: Secondary | ICD-10-CM

## 2017-06-11 DIAGNOSIS — R269 Unspecified abnormalities of gait and mobility: Secondary | ICD-10-CM | POA: Diagnosis not present

## 2017-06-12 ENCOUNTER — Telehealth: Payer: Self-pay

## 2017-06-12 DIAGNOSIS — N528 Other male erectile dysfunction: Secondary | ICD-10-CM

## 2017-06-12 LAB — CBC WITH DIFFERENTIAL/PLATELET
BASOS ABS: 0 10*3/uL (ref 0.0–0.2)
Basos: 0 %
EOS (ABSOLUTE): 0.1 10*3/uL (ref 0.0–0.4)
EOS: 1 %
HEMOGLOBIN: 16 g/dL (ref 13.0–17.7)
Hematocrit: 43.7 % (ref 37.5–51.0)
IMMATURE GRANULOCYTES: 0 %
Immature Grans (Abs): 0 10*3/uL (ref 0.0–0.1)
LYMPHS ABS: 1.3 10*3/uL (ref 0.7–3.1)
LYMPHS: 16 %
MCH: 32.3 pg (ref 26.6–33.0)
MCHC: 36.6 g/dL — ABNORMAL HIGH (ref 31.5–35.7)
MCV: 88 fL (ref 79–97)
MONOCYTES: 11 %
Monocytes Absolute: 0.9 10*3/uL (ref 0.1–0.9)
NEUTROS PCT: 72 %
Neutrophils Absolute: 5.8 10*3/uL (ref 1.4–7.0)
PLATELETS: 190 10*3/uL (ref 150–379)
RBC: 4.95 x10E6/uL (ref 4.14–5.80)
RDW: 14.3 % (ref 12.3–15.4)
WBC: 8.2 10*3/uL (ref 3.4–10.8)

## 2017-06-12 LAB — COMPREHENSIVE METABOLIC PANEL
A/G RATIO: 2 (ref 1.2–2.2)
ALT: 25 IU/L (ref 0–44)
AST: 23 IU/L (ref 0–40)
Albumin: 4.3 g/dL (ref 3.6–4.8)
Alkaline Phosphatase: 97 IU/L (ref 39–117)
BILIRUBIN TOTAL: 0.6 mg/dL (ref 0.0–1.2)
BUN / CREAT RATIO: 19 (ref 10–24)
BUN: 17 mg/dL (ref 8–27)
CHLORIDE: 102 mmol/L (ref 96–106)
CO2: 26 mmol/L (ref 20–29)
Calcium: 9.3 mg/dL (ref 8.6–10.2)
Creatinine, Ser: 0.9 mg/dL (ref 0.76–1.27)
GFR calc non Af Amer: 92 mL/min/{1.73_m2} (ref >59)
GFR, EST AFRICAN AMERICAN: 106 mL/min/{1.73_m2} (ref >59)
Globulin, Total: 2.2 g/dL (ref 1.5–4.5)
Glucose: 99 mg/dL (ref 65–99)
POTASSIUM: 4.2 mmol/L (ref 3.5–5.2)
Sodium: 143 mmol/L (ref 134–144)
TOTAL PROTEIN: 6.5 g/dL (ref 6.0–8.5)

## 2017-06-12 LAB — HEMOGLOBIN A1C
Est. average glucose Bld gHb Est-mCnc: 94 mg/dL
HEMOGLOBIN A1C: 4.9 % (ref 4.8–5.6)

## 2017-06-12 LAB — LIPID PANEL
CHOL/HDL RATIO: 4.3 ratio (ref 0.0–5.0)
Cholesterol, Total: 184 mg/dL (ref 100–199)
HDL: 43 mg/dL (ref >39)
LDL Calculated: 126 mg/dL — ABNORMAL HIGH (ref 0–99)
TRIGLYCERIDES: 75 mg/dL (ref 0–149)
VLDL CHOLESTEROL CAL: 15 mg/dL (ref 5–40)

## 2017-06-12 LAB — VITAMIN B12: Vitamin B-12: 1130 pg/mL (ref 232–1245)

## 2017-06-12 LAB — VITAMIN D 25 HYDROXY (VIT D DEFICIENCY, FRACTURES): Vit D, 25-Hydroxy: 63.7 ng/mL (ref 30.0–100.0)

## 2017-06-12 LAB — PHOSPHORUS: PHOSPHORUS: 3.2 mg/dL (ref 2.5–4.5)

## 2017-06-12 LAB — MAGNESIUM: MAGNESIUM: 2 mg/dL (ref 1.6–2.3)

## 2017-06-12 LAB — TSH: TSH: 4.06 u[IU]/mL (ref 0.450–4.500)

## 2017-06-12 NOTE — Telephone Encounter (Signed)
Please call let patient know this is best to come from his urologist.  I have never prescribed use before but since he is use the injectable and tolerated well I would entertain prescribing a suppository for him if that something he would be interested in on till he can get into see his urologist.  This is something that I would want to come from his urologist in the future though.

## 2017-06-12 NOTE — Telephone Encounter (Signed)
Patient came in for lab draw and request that we ask Dr Sharee Holsterpalski if she can write for Muse (Alprostadil) for erectile dysfunction. Sent request to Dr Sharee Holsterpalski for review. MPulliam, CMA/RT(R)

## 2017-06-13 ENCOUNTER — Telehealth: Payer: Self-pay | Admitting: Family Medicine

## 2017-06-13 NOTE — Telephone Encounter (Signed)
Patient returning medical assistant's call .--glh

## 2017-06-13 NOTE — Telephone Encounter (Signed)
Called and left message for patient to call the office back. MPulliam, CMA/RT(R)  

## 2017-06-14 ENCOUNTER — Other Ambulatory Visit: Payer: Self-pay | Admitting: Family Medicine

## 2017-06-14 NOTE — Telephone Encounter (Signed)
LVM for pt to return call.  T. Nelson, CMA 

## 2017-06-14 NOTE — Progress Notes (Signed)
I cannot order it via EMR for some reason.  Perhaps it is one of those meds you need special privalages to order.    - Please tell pt he is going to have to wait til he sees Urology for this.  Please say sorry for me but I was not aware it would be a problem.   It shouldn't be long anyhow.    Thanks, Dr Val Eagle

## 2017-06-14 NOTE — Telephone Encounter (Signed)
See additional phone notes.  T. Nelson, CMA 

## 2017-06-14 NOTE — Telephone Encounter (Signed)
Referral placed for [facility].  Can you please send in RX for suppositories?  Tiajuana Amass. Hadasah Brugger, CMA

## 2017-06-14 NOTE — Addendum Note (Signed)
Addended by: Stan HeadNELSON, Yida Hyams S on: 06/14/2017 09:14 AM   Modules accepted: Orders

## 2017-06-14 NOTE — Telephone Encounter (Signed)
See documented note in pt chart which is same as below......    I cannot order it via EMR for some reason.  Perhaps it is one of those meds you need special privalages to order.    - Please tell pt he is going to have to wait til he sees Urology for this.  Please say sorry for me but I was not aware it would be a problem.   It shouldn't be long anyhow.    Thanks, Dr Val Eagle

## 2017-06-17 ENCOUNTER — Telehealth: Payer: Self-pay | Admitting: Family Medicine

## 2017-06-17 NOTE — Telephone Encounter (Signed)
Patient states spoke to Terry Howard/med assistant and ask for Rx for suppositories status-- unable to locate response from Provider--advised would frwd message to MA and request she call patient upon arrival at office. ---glh

## 2017-06-19 ENCOUNTER — Telehealth: Payer: Self-pay | Admitting: Family Medicine

## 2017-06-19 NOTE — Telephone Encounter (Signed)
LVM for pt to call to discuss.  T. Nelson, CMA  

## 2017-06-19 NOTE — Telephone Encounter (Signed)
See additional phone message.  Tiajuana Amass. Meryle Pugmire, CMA

## 2017-06-19 NOTE — Telephone Encounter (Signed)
Patient returning medical assistant's message to call her back--frwding .  -glh

## 2017-06-19 NOTE — Telephone Encounter (Signed)
Pt informed.  Pt expressed understanding and is agreeable.  T. Nelson, CMA  

## 2017-06-19 NOTE — Telephone Encounter (Signed)
See additional phone note.  T. Nelson, CMA 

## 2017-06-20 MED FILL — BACLOFEN 10 MG TABLET: 10 | 30 days supply | Qty: 45 | Fill #2

## 2017-07-01 MED FILL — rOPINIRole HCL 2 MG TABS: 2 | 90 days supply | Qty: 45 | Fill #2

## 2017-07-22 DIAGNOSIS — N46023 Azoospermia due to obstruction of efferent ducts: Secondary | ICD-10-CM | POA: Diagnosis not present

## 2017-07-22 DIAGNOSIS — N5201 Erectile dysfunction due to arterial insufficiency: Secondary | ICD-10-CM | POA: Diagnosis not present

## 2017-07-22 MED FILL — MUSE 1,000 MCG URETHRAL SUP: 1000 | 30 days supply | Qty: 6 | Fill #0

## 2017-08-08 ENCOUNTER — Other Ambulatory Visit: Payer: Self-pay | Admitting: Family Medicine

## 2017-08-08 MED FILL — ACYCLOVIR 800 MG TABLET: 800 | 90 days supply | Qty: 90 | Fill #0

## 2017-08-12 ENCOUNTER — Other Ambulatory Visit: Payer: Self-pay

## 2017-08-12 ENCOUNTER — Ambulatory Visit (AMBULATORY_SURGERY_CENTER): Payer: Self-pay | Admitting: *Deleted

## 2017-08-12 VITALS — Ht 67.0 in | Wt 147.8 lb

## 2017-08-12 DIAGNOSIS — Z1211 Encounter for screening for malignant neoplasm of colon: Secondary | ICD-10-CM

## 2017-08-12 NOTE — Progress Notes (Signed)
No egg or soy allergy known to patient  No issues with past sedation with any surgeries  or procedures, no intubation problems  No diet pills per patient No home 02 use per patient  No blood thinners per patient  Pt denies issues with constipation  On and off  With coffee it is better No A fib or A flutter  EMMI video sent to pt's e mail pt. declined

## 2017-08-19 ENCOUNTER — Encounter: Payer: Self-pay | Admitting: Internal Medicine

## 2017-08-20 ENCOUNTER — Telehealth (INDEPENDENT_AMBULATORY_CARE_PROVIDER_SITE_OTHER): Payer: Self-pay | Admitting: Physical Medicine and Rehabilitation

## 2017-08-20 NOTE — Telephone Encounter (Signed)
OV and trigger points would be okay.  We can decide at that time if he needed something more.  He has had epidural injection in the past.

## 2017-08-20 NOTE — Telephone Encounter (Signed)
Scheduled for 1/23 at 0900.

## 2017-08-21 ENCOUNTER — Telehealth: Payer: Self-pay | Admitting: Family Medicine

## 2017-08-21 NOTE — Telephone Encounter (Signed)
Patient's wife called on behalf of patient, they are requesting a referral to [facility] for ongoing right hip pain lower back pain. Patient has been following [facility] for years on the issue and they want to try this avenue for the pain management.

## 2017-08-22 ENCOUNTER — Other Ambulatory Visit: Payer: Self-pay

## 2017-08-22 DIAGNOSIS — M545 Low back pain: Secondary | ICD-10-CM

## 2017-08-22 DIAGNOSIS — G114 Hereditary spastic paraplegia: Secondary | ICD-10-CM

## 2017-08-22 DIAGNOSIS — G8929 Other chronic pain: Secondary | ICD-10-CM

## 2017-08-22 NOTE — Telephone Encounter (Signed)
Fine to do 

## 2017-08-22 NOTE — Telephone Encounter (Signed)
Please advise if referral for physical therapy is appropriate for the patient.

## 2017-08-22 NOTE — Telephone Encounter (Signed)
Referral was put in and called patient, left message to call the office back. MPulliam, CMA/RT(R)

## 2017-08-26 ENCOUNTER — Encounter: Payer: Self-pay | Admitting: Internal Medicine

## 2017-08-26 ENCOUNTER — Ambulatory Visit (AMBULATORY_SURGERY_CENTER): Payer: No Typology Code available for payment source | Admitting: Internal Medicine

## 2017-08-26 ENCOUNTER — Other Ambulatory Visit: Payer: Self-pay

## 2017-08-26 VITALS — BP 115/81 | HR 65 | Temp 98.4°F | Resp 12 | Ht 66.0 in | Wt 143.0 lb

## 2017-08-26 DIAGNOSIS — Z1212 Encounter for screening for malignant neoplasm of rectum: Secondary | ICD-10-CM | POA: Diagnosis not present

## 2017-08-26 DIAGNOSIS — Z1211 Encounter for screening for malignant neoplasm of colon: Secondary | ICD-10-CM | POA: Diagnosis not present

## 2017-08-26 MED ORDER — SODIUM CHLORIDE 0.9 % IV SOLN: 500.0000 mL | Freq: Once | INTRAVENOUS | Status: DC

## 2017-08-26 NOTE — Progress Notes (Signed)
Report to PACU, RN, vss, BBS= Clear.  

## 2017-08-26 NOTE — Op Note (Signed)
[facility] Patient Name: Terry LeySteven Feger Procedure Date: 08/26/2017 10:35 AM MRN: [id number] Endoscopist: Iva Booparl E Arlenne Kimbley , MD Age: [age] Referring MD:  Date of Birth: 08/11/1955 Gender: Male Account #: [id number] Procedure:                Colonoscopy Indications:              Screening for colorectal malignant neoplasm Medicines:                Propofol per Anesthesia, Monitored Anesthesia Care Procedure:                Pre-Anesthesia Assessment:                           - Prior to the procedure, a History and Physical                            was performed, and patient medications and                            allergies were reviewed. The patient's tolerance of                            previous anesthesia was also reviewed. The risks                            and benefits of the procedure and the sedation                            options and risks were discussed with the patient.                            All questions were answered, and informed consent                            was obtained. Prior Anticoagulants: The patient has                            taken no previous anticoagulant or antiplatelet                            agents. ASA Grade Assessment: II - A patient with                            mild systemic disease. After reviewing the risks                            and benefits, the patient was deemed in                            satisfactory condition to undergo the procedure.                           After obtaining informed consent, the colonoscope  was passed under direct vision. Throughout the                            procedure, the patient's blood pressure, pulse, and                            oxygen saturations were monitored continuously. The                            Colonoscope was introduced through the anus and                            advanced to the the cecum, identified by   appendiceal orifice and ileocecal valve. The                            quality of the bowel preparation was excellent. The                            colonoscopy was performed without difficulty. The                            patient tolerated the procedure well. The bowel                            preparation used was Miralax. The ileocecal valve,                            appendiceal orifice, and rectum were photographed. Scope In: 10:47:19 AM Scope Out: 10:59:01 AM Scope Withdrawal Time: 0 hours 8 minutes 28 seconds  Total Procedure Duration: 0 hours 11 minutes 42 seconds  Findings:                 The perianal and digital rectal examinations were                            normal. Pertinent negatives include normal prostate                            (size, shape, and consistency).                           The colon (entire examined portion) appeared normal.                           No additional abnormalities were found on                            retroflexion. Complications:            No immediate complications. Estimated blood loss:                            None. Estimated Blood Loss:     Estimated blood loss: none. Recommendation:           - Repeat colonoscopy in  10 years for screening                            purposes.                           - Patient has a contact number available for                            emergencies. The signs and symptoms of potential                            delayed complications were discussed with the                            patient. Return to normal activities tomorrow.                            Written discharge instructions were provided to the                            patient.                           - Resume previous diet.                           - Continue present medications. Iva Boop, MD 08/26/2017 11:02:02 AM This report has been signed electronically.

## 2017-08-26 NOTE — Progress Notes (Signed)
No problems noted in the recovery room. maw 

## 2017-08-26 NOTE — Progress Notes (Signed)
Pt's states no medical or surgical changes since previsit or office visit.Patient consents to observer being present for procedure. No allergy to soy or eggs. 

## 2017-08-26 NOTE — Patient Instructions (Addendum)
Your colonoscopy was normal!  Next routine colonoscopy or other screening test in 10 years - 2029  I appreciate the opportunity to care for you. Iva Booparl E. Nathania Waldman, MD, FACG      YOU HAD AN ENDOSCOPIC PROCEDURE TODAY AT THE [facility]:   Refer to the procedure report that was given to you for any specific questions about what was found during the examination.  If the procedure report does not answer your questions, please call your gastroenterologist to clarify.  If you requested that your care partner not be given the details of your procedure findings, then the procedure report has been included in a sealed envelope for you to review at your convenience later.  YOU SHOULD EXPECT: Some feelings of bloating in the abdomen. Passage of more gas than usual.  Walking can help get rid of the air that was put into your GI tract during the procedure and reduce the bloating. If you had a lower endoscopy (such as a colonoscopy or flexible sigmoidoscopy) you may notice spotting of blood in your stool or on the toilet paper. If you underwent a bowel prep for your procedure, you may not have a normal bowel movement for a few days.  Please Note:  You might notice some irritation and congestion in your nose or some drainage.  This is from the oxygen used during your procedure.  There is no need for concern and it should clear up in a day or so.  SYMPTOMS TO REPORT IMMEDIATELY:   Following lower endoscopy (colonoscopy or flexible sigmoidoscopy):  Excessive amounts of blood in the stool  Significant tenderness or worsening of abdominal pains  Swelling of the abdomen that is new, acute  Fever of 100F or higher  For urgent or emergent issues, a gastroenterologist can be reached at any hour by calling [phone].   DIET:  We do recommend a small meal at first, but then you may proceed to your regular diet.  Drink plenty of fluids but you should avoid alcoholic beverages for 24  hours.  ACTIVITY:  You should plan to take it easy for the rest of today and you should NOT DRIVE or use heavy machinery until tomorrow (because of the sedation medicines used during the test).    FOLLOW UP: Our staff will call the number listed on your records the next business day following your procedure to check on you and address any questions or concerns that you may have regarding the information given to you following your procedure. If we do not reach you, we will leave a message.  However, if you are feeling well and you are not experiencing any problems, there is no need to return our call.  We will assume that you have returned to your regular daily activities without incident.  If any biopsies were taken you will be contacted by phone or by letter within the next 1-3 weeks.  Please call us at [phone] if you have not heard about the biopsies in 3 weeks.    SIGNATURES/CONFIDENTIALITY: You and/or your care partner have signed paperwork which will be entered into your electronic medical record.  These signatures attest to the fact that that the information above on your After Visit Summary has been reviewed and is understood.  Full responsibility of the confidentiality of this discharge information lies with you and/or your care-partner.     You may resume your current medications today. Next repeat screening colonoscopy in 10 years. Please call  if any questions or concerns.   

## 2017-08-27 ENCOUNTER — Telehealth: Payer: Self-pay | Admitting: *Deleted

## 2017-08-27 ENCOUNTER — Telehealth: Payer: Self-pay

## 2017-08-27 NOTE — Telephone Encounter (Signed)
No answer, left message to call if questions or concerns. 

## 2017-08-27 NOTE — Telephone Encounter (Signed)
Left message

## 2017-08-28 ENCOUNTER — Telehealth (INDEPENDENT_AMBULATORY_CARE_PROVIDER_SITE_OTHER): Payer: Self-pay | Admitting: Physical Medicine and Rehabilitation

## 2017-08-28 ENCOUNTER — Other Ambulatory Visit: Payer: Self-pay

## 2017-08-28 ENCOUNTER — Encounter (INDEPENDENT_AMBULATORY_CARE_PROVIDER_SITE_OTHER): Payer: Self-pay | Admitting: Physical Medicine and Rehabilitation

## 2017-08-28 ENCOUNTER — Ambulatory Visit (INDEPENDENT_AMBULATORY_CARE_PROVIDER_SITE_OTHER): Payer: No Typology Code available for payment source | Admitting: Physical Medicine and Rehabilitation

## 2017-08-28 VITALS — BP 129/89 | HR 78

## 2017-08-28 DIAGNOSIS — M5116 Intervertebral disc disorders with radiculopathy, lumbar region: Secondary | ICD-10-CM

## 2017-08-28 DIAGNOSIS — G8929 Other chronic pain: Secondary | ICD-10-CM | POA: Diagnosis not present

## 2017-08-28 DIAGNOSIS — M545 Low back pain, unspecified: Secondary | ICD-10-CM

## 2017-08-28 DIAGNOSIS — G114 Hereditary spastic paraplegia: Secondary | ICD-10-CM

## 2017-08-28 DIAGNOSIS — S76011A Strain of muscle, fascia and tendon of right hip, initial encounter: Secondary | ICD-10-CM

## 2017-08-28 DIAGNOSIS — M5416 Radiculopathy, lumbar region: Secondary | ICD-10-CM

## 2017-08-28 NOTE — Progress Notes (Deleted)
Pt states excruciating pain in lower back and right buttock area. Pt states pain has been at its worse for the past 2 months. Pt states getting up, and walking makes pain worse, heating pack eases pain.

## 2017-08-30 ENCOUNTER — Encounter (INDEPENDENT_AMBULATORY_CARE_PROVIDER_SITE_OTHER): Payer: Self-pay | Admitting: Physical Medicine and Rehabilitation

## 2017-08-30 NOTE — Telephone Encounter (Signed)
Received authorization for 09/09/17 for [id number]. Aurther Lofterry at [employer] auth # [id number]. Referral created.

## 2017-08-30 NOTE — Progress Notes (Signed)
Terry Howard - 62 y.o. male MRN [id number]  Date of birth: Feb 08, 1956  Office Visit Note: Visit Date: 08/28/2017 PCP: [name], DO Referred by: [name], DO  Subjective: Chief Complaint  Patient presents with  . Lower Back - Pain  . Right Hip - Pain   HPI: 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  He is followed by Dr. Anne Hahn at [facility] neurology.  We last saw Terry Howard in September and he had more of a lumbar sprain strain type of activity we did complete a trigger point injection that seemed to help quite a bit.  He comes in today with different symptoms.  He reports excruciating pain in the right lower back and buttock area.  He does not remember doing anything specific and no injuries or falls.  He reports the pain has been worsening now for about 2 months but really over the last 6 weeks or so it has really progressively gotten worse.  He reports that getting up from a seated position and walking makes the pain worse.  Heating pack and medication seems to reduce the pain to a degree.  He is denying any pain into the feet or lower extremities.  The pain does extend into the buttock and hip area.  He denies any groin pain bilaterally.  Denies any new weakness or bowel or bladder changes.  The patient continues to have fairly increased spasticity and has really not been tolerant of antispasm medications.  He is taking baclofen 10 mg at night.  He reports that Dr. Anne Hahn want him to take this throughout the day but he does not tolerated.  We had a brief discussion about ramping up to that dosing very slowly and he may be able to tolerate it.  He comes in today using one cane.  He is ambulating with a somewhat of a scissoring gait but also with dragging of both feet.  He does have a history of falls.  He continues to work.  By way of history we initially saw Terry Howard in 2008 and he was under  the care of Dr. Lavada Mesi.  He initially had epidural injections at the L4 level of the lumbar spine with decent relief but temporary.  Around the 2011 timeframe MRI had been updated and we ended up completing radiofrequency ablation of the L3-4 and L4-5 facet joints and again with some relief.  We saw him a couple years after that and another MRI was updated which is the one reviewed below.  Last injection performed back then was a left L2 transforaminal injection for more of a foraminal protrusion and progressive over the years degenerative change of the lumbar spine with some scoliosis as well as foraminal and lateral recess narrowing and facet arthropathy.  He has not had any high-grade central stenosis.  Over those years to he was actually requesting to placed into pain management and then did a course of chronic progressive opioid treatment which really was not very beneficial to him sit up taking quite a bit of medication.  He has a brother with the same hereditary condition who has had multiple spine surgeries and he is really been trying to avoid that.    Review of Systems  Constitutional: Negative for chills, fever, malaise/fatigue and weight loss.  HENT: Negative for hearing loss and sinus pain.   Eyes: Negative for blurred vision, double vision and photophobia.  Respiratory: Negative  for cough and shortness of breath.   Cardiovascular: Negative for chest pain, palpitations and leg swelling.  Gastrointestinal: Negative for abdominal pain, nausea and vomiting.  Genitourinary: Negative for flank pain.  Musculoskeletal: Positive for back pain, falls and joint pain. Negative for myalgias.  Skin: Negative for itching and rash.  Neurological: Negative for tremors, focal weakness and weakness.  Endo/Heme/Allergies: Negative.   Psychiatric/Behavioral: Negative for depression.  All other systems reviewed and are negative.  Otherwise per HPI.  Assessment & Plan: Visit Diagnoses:  1. Lumbar  radiculopathy   2. Chronic right-sided low back pain without sciatica   3. Radiculopathy due to lumbar intervertebral disc disorder   4. Hereditary spastic paraparesis (HCC)     Plan: Findings:  2 months of excruciating lower back and right buttock pain consistent seemingly with more of a radicular type nerve pattern then just strictly muscular or trigger point.  Examination today does not show any active trigger points particularly on the right.  He has no pain over the greater trochanter no hip pain.  I do think the best approach would be a diagnostic and hopefully therapeutic foraminal epidural injection probably at the L5 level.  I talked him at length about the use of baclofen for his spasticity.  He also has had a trial of AFOs for his weakness in the feet but those did not seem to help him so he does not wear those.  He is set up for having significant falls at times because of his ambulation and this is followed by Dr. Anne HahnWillis in neurology.  Patient continues to try to work.  Does take some Advil off and on.  He can continue to do that and depending on injection results could regroup with strategy and may be try to help with the use of the baclofen.    Meds & Orders: No orders of the defined types were placed in this encounter.  No orders of the defined types were placed in this encounter.   Follow-up: Return for Right L5 transforaminal epidural steroid injection..   Procedures: No procedures performed  No notes on file   Clinical History: MRI LUMBAR SPINE WITHOUT CONTRAST   Technique: Multiplanar and multiecho pulse sequences of the lumbar spine were obtained without intravenous contrast.   Comparison: None.   Findings: The numbering convention used for this exam terms L5-S1 as the last full intervertebral disc space above the sacrum. Dextroconvex lumbar scoliosis is present with the apex at L3. Vertebral body height is preserved. Degenerative endplate changes are present most  pronounced on both sides of L2-L3 and involving the right aspect of L4-L5. Spinal cord terminates posterior to L1. Paraspinal soft tissues grossly appear within normal limits.   T12-L1: Sagittal imaging only. Negative.   L1-L2: Disc desiccation and degeneration with circumferential disc bulge. Broad-based posterior bulging with minimal impression on the thecal sac but no significant stenosis.   L2-L3: Disc desiccation with circumferential disc bulge. Posterior broad-based bulge is left eccentric with mild left foraminal stenosis. Bulging disc contacts the exiting left L2 nerve root in the foramen.   L3-L4: Mild disc degeneration with shallow broad-based posterior bulging. Central canal and lateral recesses patent. Foramina patent.   L4-L5: Disc desiccation and degeneration with circumferential disc bulging. Mild to moderate right foraminal stenosis associated with endplate osteophytes and disc bulge. This potentially affects the right L4 nerve. Left foramen, central canal patent. Mild narrowing of the right lateral recess potentially affecting the descending right L5 nerve. Mild right facet  degeneration.   L5-S1: Negative.   IMPRESSION: 1. L2-L3 disc desiccation with circumferential disc bulge. Left eccentric broad-based posterior bulging with mild left foraminal stenosis. Bulging disc contacts the exiting left L2 nerve in the foramen. 2. L4-L5 disc desiccation and degeneration with circumferential bulge. Mild to moderate right foraminal stenosis is multifactorial, associated with endplate spurring and disc. 3. Dextroconvex lumbar scoliosis with the apex at L3.  He reports that  has never smoked. he has never used smokeless tobacco.  Recent Labs    06/11/17 0906  HGBA1C 4.9    Objective:  VS:  HT:    WT:   BMI:     BP:129/89  HR:78bpm  TEMP: ( )  RESP:96 % Physical Exam  Constitutional: He is oriented to person, place, and time. He appears well-developed and  well-nourished. No distress.  HENT:  Head: Normocephalic and atraumatic.  Nose: Nose normal.  Mouth/Throat: Oropharynx is clear and moist.  Eyes: Conjunctivae are normal. Pupils are equal, round, and reactive to light.  Neck: Normal range of motion. Neck supple. No tracheal deviation present.  Cardiovascular: Regular rhythm and intact distal pulses.  Pulmonary/Chest: Effort normal and breath sounds normal.  Abdominal: Soft. He exhibits no distension.  Musculoskeletal: He exhibits no deformity.  Patient does have pain going from sit to stand.  He has some pain with hip rotation but is not reproduces pain in the buttock region.  He has no pain in the groin.  Proximal strength with best density.  He has very tight musculature of the lumbar paraspinals but no focal trigger points at this time that I could find that could reproduce his pain.  Neurological: He is alert and oriented to person, place, and time. No sensory deficit.  Patient does have with dorsiflexion weakness bilaterally and ambulates with the use of a cane with a somewhat scissoring gait with a steppage gait because of the weakness in the feet.  He is more spastic in the proximal muscles.  Skin: Skin is warm. No rash noted.  Psychiatric: He has a normal mood and affect. His behavior is normal.  Nursing note and vitals reviewed.   Ortho Exam Imaging: No results found.  Past Medical/Family/Surgical/Social History: Medications & Allergies reviewed per EMR Patient Active Problem List   Diagnosis Date Noted  . Impingement syndrome of left shoulder 04/29/2017  . Chronic left shoulder pain 04/22/2017  . Strain of right hip adductor muscle 02/05/2017  . Statin intolerance 12/13/2016  . Vitamin D insufficiency 06/21/2016  . Elevated TSH 06/21/2016  . External hemorrhoids 05/18/2016  . Benign prostatic hyperplasia  L>R; without lower urinary tract symptoms 05/18/2016  . Health education/counseling 02/25/2016  . Restless legs  syndrome (RLS) 02/15/2016  . Erectile dysfunction 02/15/2016  . h/o CLinically Signficant Anxiety 01/05/2014  . h/o Clinical depression 12/31/2013  . Hyperlipidemia 12/18/2013  . Genital Herpes 11/18/2013  . Cannot sleep 11/18/2013  . Extremity pain 11/18/2013  . Abnormal findings on diagnostic imaging of abdomen 10/29/2013  . 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 Polypharmacy 08/18/2013  . Other long term (current) drug therapy 08/18/2013  . Chronic pain 08/11/2013  . Other specified disorders of urinary tract 07/09/2012  . Abnormality of gait 07/09/2012  . Lumbago 07/09/2012  . Disturbance of skin sensation 07/09/2012  . 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 Arthritis   . Chronic insomnia   . Chronic low back pain    Annular tear at the L2-3  and L4-5 levels 4  . Depression   . Gait disorder   . Hereditary spastic paraparesis (HCC)   . Hyperlipidemia   . 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 Neurogenic bladder   . RLS (restless legs syndrome)    Family History  Problem Relation Age of Onset  . Cancer Mother        breast cancer  . Other Mother   . Hyperlipidemia Mother   . Stroke Father        Intracranial hemorrhage  . Other Brother   . Other Brother   . Other Brother   . Colon cancer Neg Hx   . Colon polyps Neg Hx   . Esophageal cancer Neg Hx   . Stomach cancer Neg Hx   . Rectal cancer Neg Hx    Past Surgical History:  Procedure Laterality Date  . APPENDECTOMY    . BREAST SURGERY Bilateral    cysts  . COLONOSCOPY    . KNEE ARTHROSCOPY     right  . SHOULDER SURGERY     Rotator cuff, left  . ULNAR TUNNEL RELEASE Left 09/10/2014   Procedure: LEFT CUBITAL TUNNEL RELEASE;  Surgeon: [name], MD;  Location: [facility];  Service: Orthopedics;  Laterality: Left;   Social History   Occupational History  . Occupation: Unemployed    Comment: Disability  Tobacco  Use  . Smoking status: Never Smoker  . Smokeless tobacco: Never Used  Substance and Sexual Activity  . Alcohol use: Yes    Alcohol/week: 4.2 oz    Types: 7 Cans of beer per week    Comment: Consumes alcohol on occasion  . Drug use: No  . Sexual activity: Yes    Birth control/protection: Other-see comments    Comment: vasectomy

## 2017-09-02 ENCOUNTER — Telehealth: Payer: Self-pay | Admitting: Family Medicine

## 2017-09-02 MED FILL — BACLOFEN 10 MG TABLET: 10 | 30 days supply | Qty: 45 | Fill #3

## 2017-09-02 NOTE — Telephone Encounter (Signed)
[facility]

## 2017-09-04 NOTE — Telephone Encounter (Signed)
Patient called and states that he no longer needs this referral.  He has appointment set up with ENT and will contact us if anything further is needed. MPulliam, CMA/RT(R)

## 2017-09-04 NOTE — Telephone Encounter (Signed)
Per Dr. Sharee Holsterpalski, we can make appointment for the patient with her and we can now set him up with a home sleep study if he would like to do that. Called the patient left message for patient to call the office. MPulliam, CMA/RT(R)

## 2017-09-05 DIAGNOSIS — J343 Hypertrophy of nasal turbinates: Secondary | ICD-10-CM | POA: Insufficient documentation

## 2017-09-05 DIAGNOSIS — G4733 Obstructive sleep apnea (adult) (pediatric): Secondary | ICD-10-CM | POA: Insufficient documentation

## 2017-09-05 DIAGNOSIS — J3089 Other allergic rhinitis: Secondary | ICD-10-CM | POA: Insufficient documentation

## 2017-09-05 DIAGNOSIS — J342 Deviated nasal septum: Secondary | ICD-10-CM | POA: Insufficient documentation

## 2017-09-05 MED FILL — AMOX-CLAV 875-125 MG TABLET: 875-125 | 10 days supply | Qty: 20 | Fill #0

## 2017-09-05 MED FILL — MOMETASONE FUROATE 50 MCG S: 50 | 30 days supply | Qty: 17 | Fill #0

## 2017-09-09 ENCOUNTER — Ambulatory Visit (INDEPENDENT_AMBULATORY_CARE_PROVIDER_SITE_OTHER): Payer: No Typology Code available for payment source | Admitting: Physical Medicine and Rehabilitation

## 2017-09-09 ENCOUNTER — Encounter (INDEPENDENT_AMBULATORY_CARE_PROVIDER_SITE_OTHER): Payer: Self-pay | Admitting: Physical Medicine and Rehabilitation

## 2017-09-09 VITALS — BP 123/84 | HR 68 | Temp 97.8°F

## 2017-09-09 DIAGNOSIS — M545 Low back pain, unspecified: Secondary | ICD-10-CM

## 2017-09-09 DIAGNOSIS — M5416 Radiculopathy, lumbar region: Secondary | ICD-10-CM

## 2017-09-09 DIAGNOSIS — G114 Hereditary spastic paraplegia: Secondary | ICD-10-CM

## 2017-09-09 DIAGNOSIS — G8929 Other chronic pain: Secondary | ICD-10-CM

## 2017-09-09 DIAGNOSIS — M5116 Intervertebral disc disorders with radiculopathy, lumbar region: Secondary | ICD-10-CM

## 2017-09-09 NOTE — Progress Notes (Signed)
Patient came in today for planned epidural injection as noted on his last office note.  Fortunately he felt like he did not really need a driver which is something we do require.  We did reschedule him.

## 2017-09-11 ENCOUNTER — Encounter (INDEPENDENT_AMBULATORY_CARE_PROVIDER_SITE_OTHER): Payer: Self-pay | Admitting: Physical Medicine and Rehabilitation

## 2017-09-11 ENCOUNTER — Ambulatory Visit (INDEPENDENT_AMBULATORY_CARE_PROVIDER_SITE_OTHER): Payer: No Typology Code available for payment source | Admitting: Physical Medicine and Rehabilitation

## 2017-09-11 ENCOUNTER — Ambulatory Visit (INDEPENDENT_AMBULATORY_CARE_PROVIDER_SITE_OTHER): Payer: Self-pay

## 2017-09-11 VITALS — BP 119/78 | HR 100 | Temp 98.2°F

## 2017-09-11 DIAGNOSIS — M5416 Radiculopathy, lumbar region: Secondary | ICD-10-CM

## 2017-09-11 MED ORDER — BETAMETHASONE SOD PHOS & ACET 6 (3-3) MG/ML IJ SUSP: 12.0000 mg | Freq: Once | INTRAMUSCULAR | Status: DC

## 2017-09-11 NOTE — Progress Notes (Deleted)
No changes since last visit. +Driver, -BT, -Dye Allergies.

## 2017-09-20 MED FILL — rOPINIRole HCL 2 MG TABS: 2 | 90 days supply | Qty: 45 | Fill #3

## 2017-09-25 DIAGNOSIS — J324 Chronic pansinusitis: Secondary | ICD-10-CM | POA: Insufficient documentation

## 2017-09-30 NOTE — Procedures (Signed)
Lumbosacral Transforaminal Epidural Steroid Injection - Sub-Pedicular Approach with Fluoroscopic Guidance  Patient: Terry Howard      Date of Birth: May 26, 1956 MRN: [id number] PCP: Thomasene Lotpalski, Deborah, DO      Visit Date: 09/11/2017   Universal Protocol:    Date/Time: 09/11/2017  Consent Given By: the patient  Position: PRONE  Additional Comments: Vital signs were monitored before and after the procedure. Patient was prepped and draped in the usual sterile fashion. The correct patient, procedure, and site was verified.   Injection Procedure Details:  Procedure Site One Meds Administered:  Meds ordered this encounter  Medications  . betamethasone acetate-betamethasone sodium phosphate (CELESTONE) injection 12 mg    Laterality: Right  Location/Site:  L4-L5  Needle size: 22 G  Needle type: Spinal  Needle Placement: Transforaminal  Findings:    -Comments: Excellent flow of contrast along the nerve and into the epidural space.  Procedure Details: After squaring off the end-plates to get a true AP view, the C-arm was positioned so that an oblique view of the foramen as noted above was visualized. The target area is just inferior to the "nose of the scotty dog" or sub pedicular. The soft tissues overlying this structure were infiltrated with 2-3 ml. of 1% Lidocaine without Epinephrine.  The spinal needle was inserted toward the target using a "trajectory" view along the fluoroscope beam.  Under AP and lateral visualization, the needle was advanced so it did not puncture dura and was located close the 6 O'Clock position of the pedical in AP tracterory. Biplanar projections were used to confirm position. Aspiration was confirmed to be negative for CSF and/or blood. A 1-2 ml. volume of Isovue-250 was injected and flow of contrast was noted at each level. Radiographs were obtained for documentation purposes.   After attaining the desired flow of contrast documented above, a 0.5  to 1.0 ml test dose of 0.25% Marcaine was injected into each respective transforaminal space.  The patient was observed for 90 seconds post injection.  After no sensory deficits were reported, and normal lower extremity motor function was noted,   the above injectate was administered so that equal amounts of the injectate were placed at each foramen (level) into the transforaminal epidural space.   Additional Comments:  The patient tolerated the procedure well Dressing: Band-Aid    Post-procedure details: Patient was observed during the procedure. Post-procedure instructions were reviewed.  Patient left the clinic in stable condition.

## 2017-09-30 NOTE — Progress Notes (Signed)
Terry Howard - 62 y.o. male MRN [id number]  Date of birth: 14-Nov-1955  Office Visit Note: Visit Date: 09/11/2017 PCP: [name], DO Referred by: [name], DO  Subjective: Chief Complaint  Patient presents with  . Lower Back - Pain  . Right Hip - Pain   HPI: Patient comes in today for planned right L4-L5 transforaminal epidural steroid injection.  We are to complete an L4 injection based on his symptoms.  Please see our prior evaluation and management note for further details and justification.    ROS Otherwise per HPI.  Assessment & Plan: Visit Diagnoses:  1. Lumbar radiculopathy     Plan: No additional findings.   Meds & Orders:  Meds ordered this encounter  Medications  . betamethasone acetate-betamethasone sodium phosphate (CELESTONE) injection 12 mg    Orders Placed This Encounter  Procedures  . XR C-ARM NO REPORT  . Epidural Steroid injection    Follow-up: Return if symptoms worsen or fail to improve.   Procedures: No procedures performed  Lumbosacral Transforaminal Epidural Steroid Injection - Sub-Pedicular Approach with Fluoroscopic Guidance  Patient: Terry Howard      Date of Birth: 04-Jan-1956 MRN: [id number] PCP: [name], DO      Visit Date: 09/11/2017   Universal Protocol:    Date/Time: 09/11/2017  Consent Given By: the patient  Position: PRONE  Additional Comments: Vital signs were monitored before and after the procedure. Patient was prepped and draped in the usual sterile fashion. The correct patient, procedure, and site was verified.   Injection Procedure Details:  Procedure Site One Meds Administered:  Meds ordered this encounter  Medications  . betamethasone acetate-betamethasone sodium phosphate (CELESTONE) injection 12 mg    Laterality: Right  Location/Site:  L4-L5  Needle size: 22 G  Needle type: Spinal  Needle Placement: Transforaminal  Findings:    -Comments: Excellent flow of  contrast along the nerve and into the epidural space.  Procedure Details: After squaring off the end-plates to get a true AP view, the C-arm was positioned so that an oblique view of the foramen as noted above was visualized. The target area is just inferior to the "nose of the scotty dog" or sub pedicular. The soft tissues overlying this structure were infiltrated with 2-3 ml. of 1% Lidocaine without Epinephrine.  The spinal needle was inserted toward the target using a "trajectory" view along the fluoroscope beam.  Under AP and lateral visualization, the needle was advanced so it did not puncture dura and was located close the 6 O'Clock position of the pedical in AP tracterory. Biplanar projections were used to confirm position. Aspiration was confirmed to be negative for CSF and/or blood. A 1-2 ml. volume of Isovue-250 was injected and flow of contrast was noted at each level. Radiographs were obtained for documentation purposes.   After attaining the desired flow of contrast documented above, a 0.5 to 1.0 ml test dose of 0.25% Marcaine was injected into each respective transforaminal space.  The patient was observed for 90 seconds post injection.  After no sensory deficits were reported, and normal lower extremity motor function was noted,   the above injectate was administered so that equal amounts of the injectate were placed at each foramen (level) into the transforaminal epidural space.   Additional Comments:  The patient tolerated the procedure well Dressing: Band-Aid    Post-procedure details: Patient was observed during the procedure. Post-procedure instructions were reviewed.  Patient left the clinic in stable condition.  Clinical History: MRI LUMBAR SPINE WITHOUT CONTRAST   Technique: Multiplanar and multiecho pulse sequences of the lumbar spine were obtained without intravenous contrast.   Comparison: None.   Findings: The numbering convention used for this exam terms  L5-S1 as the last full intervertebral disc space above the sacrum. Dextroconvex lumbar scoliosis is present with the apex at L3. Vertebral body height is preserved. Degenerative endplate changes are present most pronounced on both sides of L2-L3 and involving the right aspect of L4-L5. Spinal cord terminates posterior to L1. Paraspinal soft tissues grossly appear within normal limits.   T12-L1: Sagittal imaging only. Negative.   L1-L2: Disc desiccation and degeneration with circumferential disc bulge. Broad-based posterior bulging with minimal impression on the thecal sac but no significant stenosis.   L2-L3: Disc desiccation with circumferential disc bulge. Posterior broad-based bulge is left eccentric with mild left foraminal stenosis. Bulging disc contacts the exiting left L2 nerve root in the foramen.   L3-L4: Mild disc degeneration with shallow broad-based posterior bulging. Central canal and lateral recesses patent. Foramina patent.   L4-L5: Disc desiccation and degeneration with circumferential disc bulging. Mild to moderate right foraminal stenosis associated with endplate osteophytes and disc bulge. This potentially affects the right L4 nerve. Left foramen, central canal patent. Mild narrowing of the right lateral recess potentially affecting the descending right L5 nerve. Mild right facet degeneration.   L5-S1: Negative.   IMPRESSION: 1. L2-L3 disc desiccation with circumferential disc bulge. Left eccentric broad-based posterior bulging with mild left foraminal stenosis. Bulging disc contacts the exiting left L2 nerve in the foramen. 2. L4-L5 disc desiccation and degeneration with circumferential bulge. Mild to moderate right foraminal stenosis is multifactorial, associated with endplate spurring and disc. 3. Dextroconvex lumbar scoliosis with the apex at L3.  He reports that  has never smoked. he has never used smokeless tobacco.  Recent Labs    06/11/17 0906    HGBA1C 4.9    Objective:  VS:  HT:    WT:   BMI:     BP:119/78  HR:100bpm  TEMP:98.2 F (36.8 C)(Oral)  RESP:97 % Physical Exam  Musculoskeletal:  Patient ambulates with cane with a spastic gait.  He does have weakness of both feet.    Ortho Exam Imaging: No results found.  Past Medical/Family/Surgical/Social History: Medications & Allergies reviewed per EMR Patient Active Problem List   Diagnosis Date Noted  . Impingement syndrome of left shoulder 04/29/2017  . Chronic left shoulder pain 04/22/2017  . Strain of right hip adductor muscle 02/05/2017  . Statin intolerance 12/13/2016  . Vitamin D insufficiency 06/21/2016  . Elevated TSH 06/21/2016  . External hemorrhoids 05/18/2016  . Benign prostatic hyperplasia  L>R; without lower urinary tract symptoms 05/18/2016  . Health education/counseling 02/25/2016  . Restless legs syndrome (RLS) 02/15/2016  . Erectile dysfunction 02/15/2016  . h/o CLinically Signficant Anxiety 01/05/2014  . h/o Clinical depression 12/31/2013  . Hyperlipidemia 12/18/2013  . Genital Herpes 11/18/2013  . Cannot sleep 11/18/2013  . Extremity pain 11/18/2013  . Abnormal findings on diagnostic imaging of abdomen 10/29/2013  . 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 Polypharmacy 08/18/2013  . Other long term (current) drug therapy 08/18/2013  . Chronic pain 08/11/2013  . Other specified disorders of urinary tract 07/09/2012  . Abnormality of gait 07/09/2012  . Lumbago 07/09/2012  . Disturbance of skin sensation 07/09/2012  . 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 Arthritis   . Chronic  insomnia   . Chronic low back pain    Annular tear at the L2-3 and L4-5 levels 4  . Depression   . Gait disorder   . Hereditary spastic paraparesis (HCC)   . Hyperlipidemia   . 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 Neurogenic bladder   . RLS (restless legs syndrome)    Family History   Problem Relation Age of Onset  . Cancer Mother        breast cancer  . Other Mother   . Hyperlipidemia Mother   . Stroke Father        Intracranial hemorrhage  . Other Brother   . Other Brother   . Other Brother   . Colon cancer Neg Hx   . Colon polyps Neg Hx   . Esophageal cancer Neg Hx   . Stomach cancer Neg Hx   . Rectal cancer Neg Hx    Past Surgical History:  Procedure Laterality Date  . APPENDECTOMY    . BREAST SURGERY Bilateral    cysts  . COLONOSCOPY    . KNEE ARTHROSCOPY     right  . SHOULDER SURGERY     Rotator cuff, left  . ULNAR TUNNEL RELEASE Left 09/10/2014   Procedure: LEFT CUBITAL TUNNEL RELEASE;  Surgeon: Cheral AlmasNaiping Michael Xu, MD;  Location: [facility];  Service: Orthopedics;  Laterality: Left;   Social History   Occupational History  . Occupation: Unemployed    Comment: Disability  Tobacco Use  . Smoking status: Never Smoker  . Smokeless tobacco: Never Used  Substance and Sexual Activity  . Alcohol use: Yes    Alcohol/week: 4.2 oz    Types: 7 Cans of beer per week    Comment: Consumes alcohol on occasion  . Drug use: No  . Sexual activity: Yes    Birth control/protection: Other-see comments    Comment: vasectomy

## 2017-10-04 ENCOUNTER — Ambulatory Visit (INDEPENDENT_AMBULATORY_CARE_PROVIDER_SITE_OTHER): Payer: No Typology Code available for payment source | Admitting: Family Medicine

## 2017-10-04 ENCOUNTER — Encounter: Payer: Self-pay | Admitting: Family Medicine

## 2017-10-04 VITALS — BP 117/79 | HR 70 | Ht 66.75 in | Wt 149.7 lb

## 2017-10-04 DIAGNOSIS — N528 Other male erectile dysfunction: Secondary | ICD-10-CM

## 2017-10-04 DIAGNOSIS — G2581 Restless legs syndrome: Secondary | ICD-10-CM | POA: Diagnosis not present

## 2017-10-04 DIAGNOSIS — E559 Vitamin D deficiency, unspecified: Secondary | ICD-10-CM | POA: Diagnosis not present

## 2017-10-04 DIAGNOSIS — F32A Depression, unspecified: Secondary | ICD-10-CM

## 2017-10-04 DIAGNOSIS — R7989 Other specified abnormal findings of blood chemistry: Secondary | ICD-10-CM

## 2017-10-04 DIAGNOSIS — Z789 Other specified health status: Secondary | ICD-10-CM | POA: Diagnosis not present

## 2017-10-04 DIAGNOSIS — E7800 Pure hypercholesterolemia, unspecified: Secondary | ICD-10-CM | POA: Diagnosis not present

## 2017-10-04 DIAGNOSIS — G114 Hereditary spastic paraplegia: Secondary | ICD-10-CM

## 2017-10-04 DIAGNOSIS — E785 Hyperlipidemia, unspecified: Secondary | ICD-10-CM

## 2017-10-04 DIAGNOSIS — F329 Major depressive disorder, single episode, unspecified: Secondary | ICD-10-CM

## 2017-10-04 MED ORDER — OMEGA-3-ACID ETHYL ESTERS 1 G PO CAPS: 2.0000 g | ORAL_CAPSULE | Freq: Two times a day (BID) | ORAL | 3 refills | Status: DC

## 2017-10-04 MED FILL — OMEGA-3 ETHYL ESTERS 1 GM C: 1 | 45 days supply | Qty: 180 | Fill #0

## 2017-10-04 NOTE — Addendum Note (Signed)
Addended by: Carlye GrippePALSKI, Shamon Lobo J on: 10/04/2017 12:55 PM   Modules accepted: Level of Service

## 2017-10-04 NOTE — Progress Notes (Signed)
Impression and Recommendations:    1. Restless legs syndrome (RLS)   2. Statin intolerance   3. Elevated low density lipoprotein (LDL) cholesterol level   4. Hyperlipidemia, unspecified hyperlipidemia type   5. Hereditary spastic paraparesis (HCC)   6. Vitamin D insufficiency   7. Elevated TSH   8. Depression, unspecified depression type   9. Other male erectile dysfunction     1. Lumbar - Advised patient to continue following up with Dr. Alvester Howard for interventional shots in his back.  Last went 2/4 and 09/11/2017.  - Per patient, received a steroid shot in his back in September, which lasted 3 months.  Epidural steroids work well for patient.  2. Excessive Snoring / RLS - Has had negative sleep studies (was told he has restless legs).  - Patient went to see Dr. Jearld Howard; could not obtain a procedure. Terry Howard- Went to see another ENT, Dr. Dillard Cannonhristopher Howard to look up his nose. - Noted more in the nasopharynx that he has excess tissue.  - Continue using a humidifier and cleaning it out regularly.  Advised patient to continue taking Claritin PRN.  3. Cholesterol - Upped dose of 1 capsule of fish oil per day.  Prescription fish oil prescribed at 4 grams per day. - Educated the patient about the importance of adequate dosage of fish oil.  - Patient intolerant to statins due to his chronic health issues & muscle spasms.  - Recommended to continue cutting back on saturated and trans fats, red meats and pork. - Healthy dietary habits encouraged, including low-carb, and high amounts of lean protein in diet.  4. ED - Self-injections (sees specialist) - Advised the patient to continue administering self-injections, as long as he can tolerate them without unpleasant side-effects.  - Continue following up with a specialist for his ED concerns.  5. Health Maintenance - Continues taking Vitamin D every day, 5000 IU.  - Requip is still working well for him.  Continue taking 1mg  every  evening.  - Baclofen continues helping his legs.  Continue taking baclofen as recommended.  - Continue lifting weights and staying active to take care of his body.  Encouraged patient to keep up his positive attitude about his health. - Knows the importance of taking care of himself and keeps working on his health.  - Encouraged the patient to look in to his options for using underwater treadmills at the gym.  - Recommended that the patient eventually strive for at least 150 minutes of cardio per week according to guidelines established by the AHA.   - Patient should also consume adequate amounts of water - half of body weight in oz of water per day  - Will continue to monitor his chronic health problems closely.  6. Follow-Up - Recently had colonoscopy in 2019.   No orders of the defined types were placed in this encounter.   Meds ordered this encounter  Medications  . omega-3 acid ethyl esters (LOVAZA) 1 g capsule    Sig: Take 2 capsules (2 g total) by mouth 2 (two) times daily.    Dispense:  180 capsule    Refill:  3    Gross side effects, risk and benefits, and alternatives of medications and treatment plan in general discussed with patient.  Patient is aware that all medications have potential side effects and we are unable to predict every side effect or drug-drug interaction that may occur.   Patient will call with any questions prior to  using medication if they have concerns.  Expresses verbal understanding and consents to current therapy and treatment regimen.  No barriers to understanding were identified.  Red flag symptoms and signs discussed in detail.  Patient expressed understanding regarding what to do in case of emergency\urgent symptoms  Please see AVS handed out to patient at the end of our visit for further patient instructions/ counseling done pertaining to today's office visit.   Return for chronic f/up 4-6 mo.    Note: This note was prepared with assistance  of Dragon voice recognition software. Occasional wrong-word or sound-a-like substitutions may have occurred due to the inherent limitations of voice recognition software.   This document serves as a record of services personally performed by [name], DO. It was created on her behalf by Peggye Fothergill, a trained medical scribe. The creation of this record is based on the scribe's personal observations and the provider's statements to them.   I have reviewed the above medical documentation for accuracy and completeness and I concur.  [name] 10/04/17 12:54 PM   ------------------------------------------------------------------------------------------------------------------------------------------------------------------------------   Subjective:     HPI: Terry Howard is a 62 y.o. male who presents to [facility] today for issues as discussed below.  Notes that he got a little sick recently, but has recovered since.  Denies fever, chills. Other than that, "It's been so wet, I guess it affects the body."  Terry Howard his son is now 53. Notes that he has to bench press his son away from him every night.  Exercise & Health Maintenance Continues lifting weights, mows lawns, to continue taking care of his body. Notes that "You've just gotta keep trucking and do the best with what you've got." Has trouble using a stationary bike.  It causes him to fatigue out too easily. Monitors his health very closely.    Requip & Baclofen Requip is still working well for him. Still taking 1mg  every evening.  Only medication he takes more than once a day is the baclofen. Notes that this is helping his legs out a lot.  Mood Mood lately, feeling hopeful and feeling okay. Found out about a new drug for MS, and wonders if it might help with his condition.  Comments that lately his older brother in [place] can't drive anymore, and can't do a lot on his  own anymore.   Notes that this brother doesn't take care of himself well.  Lumbar Follows up with Dr. Alvester Morin for interventional shots in his back.  Last went 2/4 and 09/11/2017. Received a steroid shot in his back in September, which lasted 3 months. Had a pain going down his leg, and received an epidural steroid injection, which "knocked it out."  Excessive Snoring Has been snoring excessively, "like a freight train." Went to see Dr. Jearld Fenton; had an insurance problem doing a procedure.   Then went to another ENT, Dr. Dillard Cannon, "he looked up his nose." Noted more in the nasopharynx that he has excess tissue.   Takes Claritin to try to clear it up, and isn't sure why it closes up at night.  Has been for sleep studies several times in his life; all sleep studies were negative Was told he has restless leg. Obtained one at [facility], one at [facility], and another at [facility] 10-11 years ago.  Using a humidifier for the past 3-4 years. Cleans it out every week.  ED Terry Howard to consult with a specialist.  Was given  suppositories which didn't do anything. Obtained shots after that - after some trouble managing dose, got a good dose.  Doing self-injections now to manage his ED.  Cholesterol Intolerant to statins.  Continues eating well, and is amenable to taking prescription fish oil.   Wt Readings from Last 3 Encounters:  10/04/17 149 lb 11.2 oz (67.9 kg)  08/26/17 143 lb (64.9 kg)  08/12/17 147 lb 12.8 oz (67 kg)   BP Readings from Last 3 Encounters:  10/04/17 117/79  09/11/17 119/78  09/09/17 123/84   Pulse Readings from Last 3 Encounters:  10/04/17 70  09/11/17 100  09/09/17 68   BMI Readings from Last 3 Encounters:  10/04/17 23.62 kg/m  08/26/17 23.08 kg/m  08/12/17 23.15 kg/m     Patient Care Team    Relationship Specialty Notifications Start End  [name], DO PCP - General Family Medicine  02/15/16   Tyrell Antonio, MD Consulting Physician Physical  Medicine and Rehabilitation  05/22/16   York Spaniel, MD Consulting Physician Neurology  05/22/16   Specialists, [facility]  Orthopedic Surgery  12/11/16   Suzanna Obey, MD Consulting Physician Otolaryngology  10/04/17   Iva Boop, MD Consulting Physician Gastroenterology  10/04/17   Drema Halon, MD Consulting Physician Otolaryngology  10/04/17   Ihor Gully, MD Consulting Physician Urology  10/04/17      Patient Active Problem List   Diagnosis Date Noted  . Statin intolerance 12/13/2016    Priority: High  . Restless legs syndrome (RLS) 02/15/2016    Priority: High  . Hyperlipidemia 12/18/2013    Priority: High  . 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 Elevated TSH 06/21/2016    Priority: Medium  . Vitamin D insufficiency 06/21/2016    Priority: Low  . Abnormality of gait 07/09/2012    Priority: Low  . Chronic pansinusitis 09/25/2017  . Deviated septum 09/05/2017  . Hypertrophy, nasal, turbinate 09/05/2017  . Perennial allergic rhinitis 09/05/2017  . Impingement syndrome of left shoulder 04/29/2017  . Chronic left shoulder pain 04/22/2017  . Strain of right hip adductor muscle 02/05/2017  . External hemorrhoids 05/18/2016  . Benign prostatic hyperplasia  L>R; without lower urinary tract symptoms 05/18/2016  . Health education/counseling 02/25/2016  . Erectile dysfunction 02/15/2016  . h/o CLinically Signficant Anxiety 01/05/2014  . h/o Clinical depression 12/31/2013  . Genital Herpes 11/18/2013  . Cannot sleep 11/18/2013  . Extremity pain 11/18/2013  . Abnormal findings on diagnostic imaging of abdomen 10/29/2013  . Polypharmacy 08/18/2013  . Other long term (current) drug therapy 08/18/2013  . Chronic pain 08/11/2013  . Other specified disorders of urinary tract 07/09/2012  . Lumbago 07/09/2012  . Disturbance of skin sensation 07/09/2012  . 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    No outpatient medications have been marked as taking for the 10/04/17 encounter (Office Visit) with [name], DO.   Current Facility-Administered Medications for the 10/04/17 encounter (Office Visit) with [name], DO  Medication  . 0.9 %  sodium chloride infusion  . betamethasone acetate-betamethasone sodium phosphate (CELESTONE) injection 12 mg    Allergies:  Allergies  Allergen Reactions  . Pregabalin Other (See Comments)    Intolerance, somnolence  . Statins Other (See Comments)    Other reaction(s): Muscle Pain Other reaction(s): Muscle Pain Other  reaction(s): Muscle Pain      Review of Systems:  A fourteen system review of systems was performed and found to be positive as per HPI.   Objective:   Blood pressure 117/79, pulse 70, height 5' 6.75" (1.695 m), weight 149 lb 11.2 oz (67.9 kg), SpO2 98 %. Body mass index is 23.62 kg/m. General:  Well Developed, well nourished, appropriate for stated age.  Neuro:  Alert and oriented,  extra-ocular muscles intact  HEENT:  Normocephalic, atraumatic, neck supple, no carotid bruits appreciated  Skin:  no gross rash, warm, pink. Cardiac:  RRR, S1 S2 Respiratory:  ECTA B/L and A/P, Not using accessory muscles, speaking in full sentences- unlabored. Vascular:  Ext warm, no cyanosis apprec.; cap RF less 2 sec. Psych:  No HI/SI, judgement and insight good, Euthymic mood. Full Affect.

## 2017-10-04 NOTE — Patient Instructions (Signed)
Nine ways to increase your "good" HDL cholesterol ° °High-density lipoprotein (HDL) is often referred to as the "good" cholesterol. °Having high HDL levels helps carry cholesterol from your arteries to your liver, where it can be used or excreted. ° °Having high levels of HDL also has antioxidant and anti-inflammatory effects, and is linked to a reduced risk of heart disease (1, 2). ° °Most health experts recommend minimum blood levels of 40 mg/dl in men and 50 mg/dl in women. ° °While genetics definitely play a role, there are several other factors that affect HDL levels. ° °Here are nine healthy ways to raise your "good" HDL cholesterol. ° °1. Consume olive oil ° °two pieces of salmon on a plate °olive oil being poured into a small dish °Extra virgin olive oil may be more healthful than processed olive oils. °Olive oil is one of the healthiest fats around. ° °A large analysis of 42 studies with more than 800,000 participants found that olive oil was the only source of monounsaturated fat that seemed to reduce heart disease risk (3). ° °Research has shown that one of olive oil's heart-healthy effects is an increase in HDL cholesterol. This effect is thought to be caused by antioxidants it contains called polyphenols (4, 5, 6, 7). ° °Extra virgin olive oil has more polyphenols than more processed olive oils, although the amount can still vary among different types and brands. ° °One study gave 200 healthy young men about 2 tablespoons (25 ml) of different olive oils per day for three weeks. ° °The researchers found that participants' HDL levels increased significantly more after they consumed the olive oil with the highest polyphenol content (6). ° °In another study, when 62 older adults consumed about 4 tablespoons (50 ml) of high-polyphenol extra virgin olive oil every day for six weeks, their HDL cholesterol increased by 6.5 mg/dl, on average (7). ° °In addition to raising HDL levels, olive oil has been found to  boost HDL's anti-inflammatory and antioxidant function in studies of older people and individuals with high cholesterol levels ( 7, 8, 9). ° °Whenever possible, select high-quality, certified extra virgin olive oils, which tend to be highest in polyphenols. ° °Bottom line: Extra virgin olive oil with a high polyphenol content has been shown to increase HDL levels in healthy people, the elderly and individuals with high cholesterol. ° °2. Follow a low-carb or ketogenic diet ° °Low-carb and ketogenic diets provide a number of health benefits, including weight loss and reduced blood sugar levels. ° °They have also been shown to increase HDL cholesterol in people who tend to have lower levels. ° °This includes those who are obese, insulin resistant or diabetic (10, 11, 12, 13, 14, 15, 16, 17). ° °In one study, people with type 2 diabetes were split into two groups. ° °One followed a diet consuming less than 50 grams of carbs per day. The other followed a high-carb diet. ° °Although both groups lost weight, the low-carb group's HDL cholesterol increased almost twice as much as the high-carb group's did (14). ° °In another study, obese people who followed a low-carb diet experienced an increase in HDL cholesterol of 5 mg/dl overall. ° °Meanwhile, in the same study, the participants who ate a low-fat, high-carb diet showed a decrease in HDL cholesterol (15). ° °This response may partially be due to the higher levels of fat people typically consume on low-carb diets. ° °One study in overweight women found that diets high in meat and cheese increased HDL levels   by 5-8%, compared to a higher-carb diet (18). ° °What's more, in addition to raising HDL cholesterol, very-low-carb diets have been shown to decrease triglycerides and improve several other risk factors for heart disease (13, 14, 16, 17). ° °Bottom line: Low-carb and ketogenic diets typically increase HDL cholesterol levels in people with diabetes, metabolic syndrome  and obesity. ° °3. Exercise regularly ° °Being physically active is important for heart health. ° °Studies have shown that many different types of exercise are effective at raising HDL cholesterol, including strength training, high-intensity exercise and aerobic exercise (19, 20, 21, 22, 23, 24). ° °However, the biggest increases in HDL are typically seen with high-intensity exercise. ° °One small study followed women who were living with polycystic ovary syndrome (PCOS), which is linked to a higher risk of insulin resistance. The study required them to perform high-intensity exercise three times a week. ° °The exercise led to an increase in HDL cholesterol of 8 mg/dL after 10 weeks. The women also showed improvements in other health markers, including decreased insulin resistance and improved arterial function (23). ° °In a 12-week study, overweight men who performed high-intensity exercise experienced a 10% increase in HDL cholesterol. ° °In contrast, the low-intensity exercise group showed only a 2% increase and the endurance training group experienced no change (24). ° °However, even lower-intensity exercise seems to increase HDL's anti-inflammatory and antioxidant capacities, whether or not HDL levels change (20, 21, 25). ° °Overall, high-intensity exercise such as high-intensity interval training (HIIT) and high-intensity circuit training (HICT) may boost HDL cholesterol levels the most. ° °Bottom line: Exercising several times per week can help raise HDL cholesterol and enhance its anti-inflammatory and antioxidant effects. High-intensity forms of exercise may be especially effective. ° °4. Add coconut oil to your diet ° °Studies have shown that coconut oil may reduce appetite, increase metabolic rate and help protect brain health, among other benefits. ° °Some people may be concerned about coconut oil's effects on heart health due to its high saturated fat content. ° °However, it appears that coconut oil is  actually quite heart healthy. ° °Coconut oil tends to raise HDL cholesterol more than many other types of fat. ° °In addition, it may improve the ratio of low-density-lipoprotein (LDL) cholesterol, the "bad" cholesterol, to HDL cholesterol. Improving this ratio reduces heart disease risk (26, 27, 28, 29). ° °One study examined the health effects of coconut oil on 40 women with excess belly fat. The researchers found that participants who took coconut oil daily experienced increased HDL cholesterol and a lower LDL-to-HDL ratio. ° °In contrast, the group who took soybean oil daily had a decrease in HDL cholesterol and an increase in the LDL-to-HDL ratio (29). ° °Most studies have found these health benefits occur at a dosage of about 2 tablespoons (30 ml) of coconut oil per day. It's best to incorporate this into cooking rather than eating spoonfuls of coconut oil on their own. ° °Bottom line: Consuming 2 tablespoons (30 ml) of coconut oil per day may help increase HDL cholesterol levels. ° °5. Stop smoking ° °cigarette butt °Quitting smoking can reduce the risk of heart disease and lung cancer. °Smoking increases the risk of many health problems, including heart disease and lung cancer (30). ° °One of its negative effects is a suppression of HDL cholesterol. ° °Some studies have found that quitting smoking can increase HDL levels. Indeed, one study found no significant differences in HDL levels between former smokers and people who had never smoked (31, 32,   33, 34, 35). ° °In a one-year study of more than 1,500 people, those who quit smoking had twice the increase in HDL as those who resumed smoking within the year. The number of large HDL particles also increased, which further reduced heart disease risk (32). ° °One study followed smokers who switched from traditional cigarettes to electronic cigarettes for one year. They found that the switch was associated with an increase in HDL cholesterol of 5 mg/dl, on  average (33). ° °When it comes to the effect of nicotine replacement patches on HDL levels, research results have been mixed. ° °One study found that nicotine replacement therapy led to higher HDL cholesterol. However, other research suggests that people who use nicotine patches likely won't see increases in HDL levels until after replacement therapy is completed (34, 36). ° °Even in studies where HDL cholesterol levels didn't increase after people quit smoking, HDL function improved, resulting in less inflammation and other beneficial effects on heart health (37). ° °Bottom line: Quitting smoking can increase HDL levels, improve HDL function and help protect heart health. ° °6. Lose weight ° °When overweight and obese people lose weight, their HDL cholesterol levels usually increase. ° °What's more, this benefit seems to occur whether weight loss is achieved by calorie counting, carb restriction, intermittent fasting, weight loss surgery or a combination of diet and exercise (16, 38, 39, 40, 41, 42). ° °One study examined HDL levels in more than 3,000 overweight and obese [place] adults who followed a lifestyle modification program for one year. ° °The researchers found that losing at least 6.6 lbs (3 kg) led to an increase in HDL cholesterol of 4 mg/dl, on average (41). ° °In another study, when obese people with type 2 diabetes consumed calorie-restricted diets that provided 20-30% of calories from protein, they experienced significant increases in HDL cholesterol levels (42). ° °The key to achieving and maintaining healthy HDL cholesterol levels is choosing the type of diet that makes it easiest for you to lose weight and keep it off. ° °Bottom Line: Several methods of weight loss have been shown to increase HDL cholesterol levels in people who are overweight or obese. ° °7. Choose purple produce ° °Consuming purple-colored fruits and vegetables is a delicious way to potentially increase HDL  cholesterol. ° °Purple produce contains antioxidants known as anthocyanins. ° °Studies using anthocyanin extracts have shown that they help fight inflammation, protect your cells from damaging free radicals and may also raise HDL cholesterol levels (43, 44, 45, 46). ° °In a 24-week study of 58 people with diabetes, those who took an anthocyanin supplement twice a day experienced a 19% increase in HDL cholesterol, on average, along with other improvements in heart health markers (45). ° °In another study, when people with cholesterol issues took anthocyanin extract for 12 weeks, their HDL cholesterol levels increased by 13.7% (46). ° °Although these studies used extracts instead of foods, there are several fruits and vegetables that are very high in anthocyanins. These include eggplant, purple corn, red cabbage, blueberries, blackberries and black raspberries. ° °Bottom line: Consuming fruits and vegetables rich in anthocyanins may help increase HDL cholesterol levels. ° °8. Eat fatty fish often ° °The omega-3 fats in fatty fish provide major benefits to heart health, including a reduction in inflammation and better functioning of the cells that line your arteries (47, 48). ° °There's some research showing that eating fatty fish or taking fish oil may also help raise low levels of HDL cholesterol (49, 50, 51,   52, 53). ° °In a study of 33 heart disease patients, participants that consumed fatty fish four times per week experienced an increase in HDL cholesterol levels. The particle size of their HDL also increased (52). ° °In another study, overweight men who consumed herring five days a week for six weeks had a 5% increase in HDL cholesterol, compared with their levels after eating lean pork and chicken five days a week (53). ° °However, there are a few studies that found no increase in HDL cholesterol in response to increased fish or omega-3 supplement intake (54, 55). ° °In addition to herring, other types of fatty  fish that may help raise HDL cholesterol include salmon, sardines, mackerel and anchovies. ° °Bottom line: Eating fatty fish several times per week may help increase HDL cholesterol levels and provide other benefits to heart health. ° °9. Avoid artificial trans fats ° °Artificial trans fats have many negative health effects due to their inflammatory properties (56, 57). ° °There are two types of trans fats. One kind occurs naturally in animal products, including full-fat dairy. ° °In contrast, the artificial trans fats found in margarines and processed foods are created by adding hydrogen to unsaturated vegetable and seed oils. These fats are also known as industrial trans fats or partially hydrogenated fats. ° °Research has shown that, in addition to increasing inflammation and contributing to several health problems, these artificial trans fats may lower HDL cholesterol levels. ° °In one study, researchers compared how people's HDL levels responded when they consumed different margarines. ° °The study found that participants' HDL cholesterol levels were 10% lower after consuming margarine containing partially hydrogenated soybean oil, compared to their levels after consuming palm oil (58). ° °Another controlled study followed 40 adults who had diets high in different types of trans fats. ° °They found that HDL cholesterol levels in women were significantly lower after they consumed the diet high in industrial trans fats, compared to the diet containing naturally occurring trans fats (59). ° °To protect heart health and keep HDL cholesterol in the healthy range, it's best to avoid artificial trans fats altogether. ° °Bottom line: Artificial trans fats have been shown to lower HDL levels and increase inflammation, compared to other fats. ° °Take home message ° °Although your HDL cholesterol levels are partly determined by your genetics, there are many things you can do to naturally increase your own  levels. ° °Fortunately, the practices that raise HDL cholesterol often provide other health benefits as well. ° °Guidelines for a Low Cholesterol, Low Saturated Fat Diet ° ° °Fats °- Limit total intake of fats and oils. °- Avoid butter, stick margarine, shortening, lard, palm and coconut oils. °- Limit mayonnaise, salad dressings, gravies and sauces, unless they are homemade with low-fat ingredients. °- Limit chocolate. °- Choose low-fat and nonfat products, such as low-fat mayonnaise, low-fat or non-hydrogenated peanut butter, low-fat or fat-free salad dressings and nonfat gravy. °- Use vegetable oil, such as canola or olive oil. °- Look for margarine that does not contain trans fatty acids. °- Use nuts in moderate amounts. °- Read ingredient labels carefully to determine both amount and type of fat present in foods. Limit saturated and trans fats! °- Avoid high-fat processed and convenience foods. ° °Meats and Meat Alternatives °- Choose fish, chicken, turkey and lean meats. °- Use dried beans, peas, lentils and tofu. °- Limit egg yolks to three to four per week. °- If you eat red meat, limit to no more than three servings per   week and choose loin or round cuts. °- Avoid fatty meats, such as bacon, sausage, franks, luncheon meats and ribs. °- Avoid all organ meats, including liver. ° °Dairy °- Choose nonfat or low-fat milk, yogurt and cottage cheese. °- Most cheeses are high in fat. Choose cheeses made from non-fat milk, such as mozzarella and ricotta cheese. °- Choose light or fat-free cream cheese and sour cream. °- Avoid cream and sauces made with cream. ° °Fruits and Vegetables °- Eat a wide variety of fruits and vegetables. °- Use lemon juice, vinegar or "mist" olive oil on vegetables. °- Avoid adding sauces, fat or oil to vegetables. ° °Breads, Cereals and Grains °- Choose whole-grain breads, cereals, pastas and rice. °- Avoid high-fat snack foods, such as granola, cookies, pies, pastries, doughnuts and  croissants. ° °Cooking Tips °- Avoid deep fried foods. °- Trim visible fat off meats and remove skin from poultry before cooking. °- Bake, broil, boil, poach or roast poultry, fish and lean meats. °- Drain and discard fat that drains out of meat as you cook it. °- Add little or no fat to foods. °- Use vegetable oil sprays to grease pans for cooking or baking. °- Steam vegetables. °- Use herbs or no-oil marinades to flavor foods. ° °

## 2017-10-07 ENCOUNTER — Other Ambulatory Visit: Payer: Self-pay | Admitting: Neurology

## 2017-10-07 MED FILL — BACLOFEN 10 MG TABLET: 10 | 6 days supply | Qty: 10 | Fill #4

## 2017-10-11 MED FILL — BACLOFEN 10 MG TABLET: 10 | 30 days supply | Qty: 30 | Fill #0

## 2017-11-11 ENCOUNTER — Other Ambulatory Visit: Payer: Self-pay | Admitting: Neurology

## 2017-11-11 MED FILL — ACYCLOVIR 800 MG TABLET: 800 | 90 days supply | Qty: 90 | Fill #1

## 2017-11-11 MED FILL — BACLOFEN 10 MG TABLET: 10 | 30 days supply | Qty: 30 | Fill #1

## 2017-11-14 ENCOUNTER — Ambulatory Visit (INDEPENDENT_AMBULATORY_CARE_PROVIDER_SITE_OTHER): Payer: No Typology Code available for payment source | Admitting: Orthopaedic Surgery

## 2017-11-14 ENCOUNTER — Encounter (INDEPENDENT_AMBULATORY_CARE_PROVIDER_SITE_OTHER): Payer: Self-pay | Admitting: Orthopaedic Surgery

## 2017-11-14 ENCOUNTER — Ambulatory Visit (INDEPENDENT_AMBULATORY_CARE_PROVIDER_SITE_OTHER): Payer: No Typology Code available for payment source

## 2017-11-14 DIAGNOSIS — G8929 Other chronic pain: Secondary | ICD-10-CM | POA: Diagnosis not present

## 2017-11-14 DIAGNOSIS — M25511 Pain in right shoulder: Secondary | ICD-10-CM | POA: Diagnosis not present

## 2017-11-14 NOTE — Progress Notes (Signed)
Office Visit Note   Patient: Terry Howard           Date of Birth: 27-Feb-1956           MRN: [id number] Visit Date: 11/14/2017              Requested by: Thomasene Lotpalski, Deborah, DO [address] [place], [place] [id number] PCP: Thomasene Lotpalski, Deborah, DO   Assessment & Plan: Visit Diagnoses:  1. Chronic right shoulder pain     Plan: Impression is continued right shoulder pain possible rotator cuff tear.  Recommend MRI to assess for this.  Follow-up after the MRI.  Follow-Up Instructions: Return in about 2 weeks (around 11/28/2017).   Orders:  Orders Placed This Encounter  Procedures  . XR Shoulder Right  . MR SHOULDER RIGHT WO CONTRAST   No orders of the defined types were placed in this encounter.     Procedures: No procedures performed   Clinical Data: No additional findings.   Subjective: Chief Complaint  Patient presents with  . Right Shoulder - Pain    Terry Howard [age] year old gentleman comes in with shoulder pain that is previously saw him for back in September.  Subacromial injection gave him about 3 months of relief.  He is continues have pain in his shoulder worse with lifting and use.  Denies any radicular symptoms.   Review of Systems  Constitutional: Negative.   All other systems reviewed and are negative.    Objective: Vital Signs: There were no vitals taken for this visit.  Physical Exam  Constitutional: He is oriented to person, place, and time. He appears well-developed and well-nourished.  Pulmonary/Chest: Effort normal.  Abdominal: Soft.  Neurological: He is alert and oriented to person, place, and time.  Skin: Skin is warm.  Psychiatric: He has a normal mood and affect. His behavior is normal. Judgment and thought content normal.  Nursing note and vitals reviewed.   Ortho Exam Right shoulder exam shows positive empty can and positive impingement.  Range of motion is normal. Specialty Comments:  No specialty comments  available.  Imaging: Xr Shoulder Right  Result Date: 11/14/2017 No acute or structural abnormalities    PMFS History: Patient Active Problem List   Diagnosis Date Noted  . Chronic pansinusitis 09/25/2017  . Deviated septum 09/05/2017  . Hypertrophy, nasal, turbinate 09/05/2017  . Perennial allergic rhinitis 09/05/2017  . Impingement syndrome of left shoulder 04/29/2017  . Chronic left shoulder pain 04/22/2017  . Strain of right hip adductor muscle 02/05/2017  . Statin intolerance 12/13/2016  . Vitamin D insufficiency 06/21/2016  . Elevated TSH 06/21/2016  . External hemorrhoids 05/18/2016  . Benign prostatic hyperplasia  L>R; without lower urinary tract symptoms 05/18/2016  . Health education/counseling 02/25/2016  . Restless legs syndrome (RLS) 02/15/2016  . Erectile dysfunction 02/15/2016  . h/o CLinically Signficant Anxiety 01/05/2014  . h/o Clinical depression 12/31/2013  . Hyperlipidemia 12/18/2013  . Genital Herpes 11/18/2013  . Cannot sleep 11/18/2013  . Extremity pain 11/18/2013  . Abnormal findings on diagnostic imaging of abdomen 10/29/2013  . 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 Polypharmacy 08/18/2013  . Other long term (current) drug therapy 08/18/2013  . Chronic pain 08/11/2013  . Other specified disorders of urinary tract 07/09/2012  . Abnormality of gait 07/09/2012  . Lumbago 07/09/2012  . Disturbance of skin sensation 07/09/2012  . 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 Arthritis   . Chronic insomnia   .  Chronic low back pain    Annular tear at the L2-3 and L4-5 levels 4  . Depression   . Gait disorder   . Hereditary spastic paraparesis (HCC)   . Hyperlipidemia   . 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 Neurogenic bladder   . RLS (restless legs syndrome)     Family History  Problem Relation Age of Onset  . Cancer Mother        breast cancer  . Other Mother   .  Hyperlipidemia Mother   . Stroke Father        Intracranial hemorrhage  . Other Brother   . Other Brother   . Other Brother   . Colon cancer Neg Hx   . Colon polyps Neg Hx   . Esophageal cancer Neg Hx   . Stomach cancer Neg Hx   . Rectal cancer Neg Hx     Past Surgical History:  Procedure Laterality Date  . APPENDECTOMY    . BREAST SURGERY Bilateral    cysts  . COLONOSCOPY    . KNEE ARTHROSCOPY     right  . SHOULDER SURGERY     Rotator cuff, left  . ULNAR TUNNEL RELEASE Left 09/10/2014   Procedure: LEFT CUBITAL TUNNEL RELEASE;  Surgeon: [name], MD;  Location: [facility];  Service: Orthopedics;  Laterality: Left;   Social History   Occupational History  . Occupation: Unemployed    Comment: Disability  Tobacco Use  . Smoking status: Never Smoker  . Smokeless tobacco: Never Used  Substance and Sexual Activity  . Alcohol use: Yes    Alcohol/week: 4.2 oz    Types: 7 Cans of beer per week    Comment: Consumes alcohol on occasion  . Drug use: No  . Sexual activity: Yes    Birth control/protection: Other-see comments    Comment: vasectomy

## 2017-11-19 ENCOUNTER — Telehealth (INDEPENDENT_AMBULATORY_CARE_PROVIDER_SITE_OTHER): Payer: Self-pay | Admitting: *Deleted

## 2017-11-19 NOTE — Telephone Encounter (Signed)
Pt called back and he is going to call [facility] to schedule appt and then I will call his insurance company to get him approved

## 2017-11-19 NOTE — Telephone Encounter (Signed)
Pt called left vm stating he has not heard about his appt yet and would like to know what the status is. IC pt back left vm advising him to return my call, pt has [employer] plan and have a couple questions for him as to where he would like to go have his exam done.

## 2017-11-21 NOTE — Telephone Encounter (Signed)
Insurance approved and pt aware

## 2017-11-26 ENCOUNTER — Ambulatory Visit (INDEPENDENT_AMBULATORY_CARE_PROVIDER_SITE_OTHER): Payer: No Typology Code available for payment source | Admitting: Family Medicine

## 2017-11-26 ENCOUNTER — Encounter: Payer: Self-pay | Admitting: Family Medicine

## 2017-11-26 ENCOUNTER — Ambulatory Visit
Admission: RE | Admit: 2017-11-26 | Discharge: 2017-11-26 | Disposition: A | Discharge status: Home / Self Care | Payer: No Typology Code available for payment source | Source: Ambulatory Visit | Attending: Orthopaedic Surgery | Admitting: Orthopaedic Surgery

## 2017-11-26 VITALS — BP 130/88 | HR 81 | Ht 66.75 in | Wt 144.6 lb

## 2017-11-26 DIAGNOSIS — G4700 Insomnia, unspecified: Secondary | ICD-10-CM

## 2017-11-26 DIAGNOSIS — F39 Unspecified mood [affective] disorder: Secondary | ICD-10-CM

## 2017-11-26 DIAGNOSIS — R0683 Snoring: Secondary | ICD-10-CM

## 2017-11-26 DIAGNOSIS — M25511 Pain in right shoulder: Principal | ICD-10-CM

## 2017-11-26 DIAGNOSIS — G8929 Other chronic pain: Secondary | ICD-10-CM

## 2017-11-26 MED ORDER — FLUOXETINE HCL 10 MG PO TABS: ORAL_TABLET | ORAL | 1 refills | Status: DC

## 2017-11-26 MED ORDER — TRAZODONE HCL 50 MG PO TABS: ORAL_TABLET | ORAL | 1 refills | Status: DC

## 2017-11-26 MED FILL — traZODone HCL 50 MG TABS: 50 | 90 days supply | Qty: 90 | Fill #0

## 2017-11-26 MED FILL — FLUoxetine HCL 10 MG TABS: 10 | 90 days supply | Qty: 90 | Fill #0

## 2017-11-26 NOTE — Progress Notes (Signed)
Pt here for an acute care OV today   Impression and Recommendations:    1. Mood disorder (HCC)   2. Habitual snoring   3. Insomnia, unspecified type     1. Insomnia - Starting two new medicines today to aid with sleep.  - Low dose of trazodone recommended, to help both with patient's sleep and mood.  - Low dose Prozac prescribed for mood mitigation and slight energy boost.  - Reviewed that patient's reported new onset snoring over the past 8-9 months could indicate obstructive sleep apnea.  Patient knows that if he has increased snoring, he might need another sleep study evaluation.  Patient declines referral at this time.  2. Follow-Up - Patient will return in 6 weeks to check on progress with new prescriptions.  - Prior to follow-up, if he has any questions or concerns, he will let us know and return sooner if needed.  Meds ordered this encounter  Medications  . traZODone (DESYREL) 50 MG tablet    Sig: 0.5-1 tablet p.o. nightly as needed sleep    Dispense:  90 tablet    Refill:  1  . FLUoxetine (PROZAC) 10 MG tablet    Sig: Use one half tablet daily for 1 week then increase to 1 tablet daily    Dispense:  90 tablet    Refill:  1    No orders of the defined types were placed in this encounter.    Education and routine counseling performed. Handouts provided  Gross side effects, risk and benefits, and alternatives of medications and treatment plan in general discussed with patient.  Patient is aware that all medications have potential side effects and we are unable to predict every side effect or drug-drug interaction that may occur.   Patient will call with any questions prior to using medication if they have concerns.  Expresses verbal understanding and consents to current therapy and treatment regimen.  No barriers to understanding were identified.  Red flag symptoms and signs discussed in detail.  Patient expressed understanding regarding what to do in case of  emergency\urgent symptoms   Please see AVS handed out to patient at the end of our visit for further patient instructions/ counseling done pertaining to today's office visit.   Return for 6wks- f/up started prozac and trazadone.     Note: This document was prepared occasionally using Dragon voice recognition software and may include unintentional dictation errors in addition to a scribe.  This document serves as a record of services personally performed by [name], DO. It was created on her behalf by Peggye Fothergill, a trained medical scribe. The creation of this record is based on the scribe's personal observations and the provider's statements to them.   I have reviewed the above medical documentation for accuracy and completeness and I concur.  [name] 12/05/17 1:23 PM   -------------------------------------------------------------------------------------   Subjective:    CC:  Chief Complaint  Patient presents with  . Follow-up    HPI: Terry Howard is a 62 y.o. male who presents to [facility] today for issues as discussed below.  Acutely Severe Insomnia Patient has had insomnia for over 30 years and has been on several medications over this time.  Patient started snoring 8-9 months ago, but was never prescribed with sleep apnea.  Has been to 3 sleep centers in the past.  First went to [facility] sleep center.  Was prescribed Requip for restless leg syndrome.  Next went to [facility] sleep center and was prescribed Ambien.  Notes that Ambien worked "a little bit," but no longer takes it.  Lately hasn't been using anything for sleep.  Currently has issues both with falling asleep and staying asleep.  Notes that it's been getting worse, causing him to wake up more at night.    Currently sleeps about 2-3 hours per night.  He gets up and lies on the couch, then gets back up and gets on the computer or watches sports on  TV.  Remarks "I've gotten slower, am tripping more."  Has a new cane to help manage his balance.  Some days, he feels that he cannot get up, "I just feel blah and I hate that."  He wonders that if he can get more sleep, this will help him to "get up and go" a little more.  Is feeling desperate to get better sleep.  Doesn't remember if he's ever taken trazodone.  Flexeril helped him sleep, but it left him feeling "unmotivated" the next day.  Tried Elavil.  This made him too sleepy, too long into the next day (until around one or two PM).  He has never taken [place] or [place].   Depression screen [facility] 2/9 11/26/2017 10/04/2017 06/04/2017 04/22/2017 12/13/2016  Decreased Interest 1 0 1 0 0  Down, Depressed, Hopeless 1 1 0 0 0  PHQ - 2 Score 2 1 1  0 0  Altered sleeping 3 1 2  - 2  Tired, decreased energy 2 1 1  - 1  Change in appetite 2 0 1 - 0  Feeling bad or failure about yourself  1 0 0 - 0  Trouble concentrating 1 0 0 - 0  Moving slowly or fidgety/restless 2 1 0 - 0  Suicidal thoughts 0 0 0 - 0  PHQ-9 Score 13 4 5  - 3  Difficult doing work/chores Somewhat difficult Somewhat difficult Not difficult at all - -    No problems updated.   Wt Readings from Last 3 Encounters:  11/26/17 144 lb 9.6 oz (65.6 kg)  10/04/17 149 lb 11.2 oz (67.9 kg)  08/26/17 143 lb (64.9 kg)   BP Readings from Last 3 Encounters:  11/26/17 130/88  10/04/17 117/79  09/11/17 119/78   BMI Readings from Last 3 Encounters:  11/26/17 22.82 kg/m  10/04/17 23.62 kg/m  08/26/17 23.08 kg/m     Patient Care Team    Relationship Specialty Notifications Start End  [name], DO PCP - General Family Medicine  02/15/16   Tyrell Antonio, MD Consulting Physician Physical Medicine and Rehabilitation  05/22/16   York Spaniel, MD Consulting Physician Neurology  05/22/16   Specialists, [facility]  Orthopedic Surgery  12/11/16   Suzanna Obey, MD Consulting Physician Otolaryngology  10/04/17    Iva Boop, MD Consulting Physician Gastroenterology  10/04/17   Drema Halon, MD Consulting Physician Otolaryngology  10/04/17   Ihor Gully, MD Consulting Physician Urology  10/04/17      Patient Active Problem List   Diagnosis Date Noted  . Statin intolerance 12/13/2016    Priority: High  . Restless legs syndrome (RLS) 02/15/2016    Priority: High  . Hyperlipidemia 12/18/2013    Priority: High  . 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 Elevated TSH 06/21/2016    Priority: Medium  . Vitamin D insufficiency 06/21/2016    Priority: Low  . Abnormality of gait 07/09/2012    Priority: Low  .  Insomnia 11/26/2017  . Habitual snoring 11/26/2017  . Mood disorder (HCC) 11/26/2017  . Chronic pansinusitis 09/25/2017  . Deviated septum 09/05/2017  . Hypertrophy, nasal, turbinate 09/05/2017  . Perennial allergic rhinitis 09/05/2017  . Impingement syndrome of left shoulder 04/29/2017  . Chronic left shoulder pain 04/22/2017  . Strain of right hip adductor muscle 02/05/2017  . External hemorrhoids 05/18/2016  . Benign prostatic hyperplasia  L>R; without lower urinary tract symptoms 05/18/2016  . Health education/counseling 02/25/2016  . Erectile dysfunction 02/15/2016  . h/o CLinically Signficant Anxiety 01/05/2014  . h/o Clinical depression 12/31/2013  . Genital Herpes 11/18/2013  . Cannot sleep 11/18/2013  . Extremity pain 11/18/2013  . Abnormal findings on diagnostic imaging of abdomen 10/29/2013  . Polypharmacy 08/18/2013  . Other long term (current) drug therapy 08/18/2013  . Chronic pain 08/11/2013  . Other specified disorders of urinary tract 07/09/2012  . Lumbago 07/09/2012  . Disturbance of skin sensation 07/09/2012  . 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    Current Meds  Medication Sig  . acyclovir (ZOVIRAX) 800 MG tablet TAKE 1 TABLET (800 MG TOTAL) BY MOUTH DAILY.  . baclofen (LIORESAL) 10 MG tablet Take 1 tablet (10 mg total) by mouth at bedtime.  . baclofen (LIORESAL) 10 MG tablet Take 1 tablet (10 mg total) by mouth at bedtime.  . Cholecalciferol (VITAMIN D3) 5000 units TABS 5,000 IU OTC vitamin D3 daily.  Marland Kitchen. omega-3 acid ethyl esters (LOVAZA) 1 g capsule Take 2 capsules (2 g total) by mouth 2 (two) times daily.  Marland Kitchen. rOPINIRole (REQUIP) 2 MG tablet Take 0.5 tablets (1 mg total) by mouth daily.  . Turmeric Curcumin 500 MG CAPS Take 1 capsule by mouth daily.   Current Facility-Administered Medications for the 11/26/17 encounter (Office Visit) with Thomasene Lotpalski, Bernardina Cacho, DO  Medication  . 0.9 %  sodium chloride infusion  . betamethasone acetate-betamethasone sodium phosphate (CELESTONE) injection 12 mg    Allergies:  Allergies  Allergen Reactions  . Pregabalin Other (See Comments)    Intolerance, somnolence  . Statins Other (See Comments)    Other reaction(s): Muscle Pain Other reaction(s): Muscle Pain Other reaction(s): Muscle Pain      Review of Systems: General:   Denies fever, chills, unexplained weight loss.  Optho/Auditory:   Denies visual changes, blurred vision/LOV Respiratory:   Denies wheeze, DOE more than baseline levels.  Cardiovascular:   Denies chest pain, palpitations, new onset peripheral edema  Gastrointestinal:   Denies nausea, vomiting, diarrhea, abd pain.  Genitourinary: Denies dysuria, freq/ urgency, flank pain or discharge from genitals.  Endocrine:     Denies hot or cold intolerance, polyuria, polydipsia. Musculoskeletal:   Denies unexplained myalgias, joint swelling, unexplained arthralgias, gait problems.  Skin:  Denies new onset rash, suspicious lesions Neurological:     Denies dizziness, unexplained weakness, numbness  Psychiatric/Behavioral:   Denies mood changes, suicidal or homicidal  ideations, hallucinations    Objective:   Blood pressure 130/88, pulse 81, height 5' 6.75" (1.695 m), weight 144 lb 9.6 oz (65.6 kg), SpO2 97 %. Body mass index is 22.82 kg/m. General:  Well Developed, well nourished, appropriate for stated age.  Neuro:  Alert and oriented,  extra-ocular muscles intact  HEENT:  Normocephalic, atraumatic, neck supple Skin:  no gross rash, warm, pink. Cardiac:  RRR, S1 S2 Respiratory:  ECTA B/L and  A/P, Not using accessory muscles, speaking in full sentences- unlabored. Vascular:  Ext warm, no cyanosis apprec.; cap RF less 2 sec. Psych:  No HI/SI, judgement and insight good, Euthymic mood. Full Affect.

## 2017-11-26 NOTE — Patient Instructions (Signed)
 -  Also we need to transition your brain into thinking more positively.  These tasks below are some things I want you to do every day 1)  write 3 new things that you are grateful for every day for 21 days  2)  exercise daily- walk for 15 minutes twice a day every day 3)  you are going to journal every day about one positive experience that you had 4)  meditate every day.  You can go on YouTube and look for 15-minute relaxation meditation or what ever.  But we need to make sure that you are in the moment and relaxing and deep breathing every day 5)  Write 1 positive email every day to praise someone in your life     - If you have insomnia or difficulty sleeping, this information is for you:  - Avoid caffeinated beverages after lunch,  no alcoholic beverages,  no eating within 2-3 hours of lying down,  avoid exposure to blue light before bed,  avoid daytime naps, and  needs to maintain a regular sleep schedule- go to sleep and wake up around the same time every night.   - Resolve concerns or worries before entering bedroom:  Discussed relaxation techniques with patient and to keep a journal to write down fears\ worries.  I suggested seeing a counselor for CBT.   - Recommend patient meditate or do deep breathing exercises to help relax.   Incorporate the use of white noise machines or listen to "sleep meditation music", or recordings of guided meditations for sleep from YouTube which are free, such as  "guided meditation for detachment from over thinking"  by Michael Sealey.     

## 2017-12-02 ENCOUNTER — Ambulatory Visit (INDEPENDENT_AMBULATORY_CARE_PROVIDER_SITE_OTHER): Payer: No Typology Code available for payment source | Admitting: Orthopaedic Surgery

## 2017-12-02 ENCOUNTER — Encounter (INDEPENDENT_AMBULATORY_CARE_PROVIDER_SITE_OTHER): Payer: Self-pay | Admitting: Orthopaedic Surgery

## 2017-12-02 DIAGNOSIS — M7542 Impingement syndrome of left shoulder: Secondary | ICD-10-CM | POA: Diagnosis not present

## 2017-12-02 NOTE — Progress Notes (Signed)
Office Visit Note   Patient: Terry Howard           Date of Birth: 05-27-1956           MRN: [id number] Visit Date: 12/02/2017              Requested by: [name], DO [address] [place], [place] [address] PCP: [name], DO   Assessment & Plan: Visit Diagnoses:  1. Impingement syndrome of left shoulder     Plan: Impression is right shoulder adhesive capsulitis and rotator cuff tendinitis.  We will refer the patient to Dr. Alvester Morin for an intra-articular cortisone injection.  We will then have him start outpatient physical therapy.  He will follow-up with [place] in 6 weeks time or will likely reinject him in symptoms to more outpatient physical therapy.  He will call us with concerns or questions in the meantime  Follow-Up Instructions: Return in about 6 weeks (around 01/13/2018).   Orders:  Orders Placed This Encounter  Procedures  . Ambulatory referral to Physical Medicine Rehab   No orders of the defined types were placed in this encounter.     Procedures: No procedures performed   Clinical Data: No additional findings.   Subjective: Chief Complaint  Patient presents with  . Right Shoulder - Pain, Follow-up    HPI patient is a pleasant 62 year old gentleman who comes in today to discuss MRI results of his right shoulder.  MRI of the left shoulder shows moderate rotator cuff tendinopathy but no full-thickness retracted tear.  Significant for adhesive capsulitis.  He had a subacromial cortisone injection back in September 2018 which helped quite a bit, but only lasted for 3 months.  His pain is returned and is worsened.  Of note, he does have hereditary spastic paraparesis and has limited use of both legs.  He is using his arms more and more due to this.  Review of Systems as detailed in HPI.  All others reviewed and are negative.   Objective: Vital Signs: There were no vitals taken for this visit.  Physical Exam well-developed well-nourished  gentleman no acute distress.  Alert and oriented x3.  Examination of the  Ortho Exam right shoulder shows full forward flexion, but this is painful and somewhat tight.  Specialty Comments:  No specialty comments available.  Imaging: No results found.   PMFS History: Patient Active Problem List   Diagnosis Date Noted  . Insomnia 11/26/2017  . Habitual snoring 11/26/2017  . Mood disorder (HCC) 11/26/2017  . Chronic pansinusitis 09/25/2017  . Deviated septum 09/05/2017  . Hypertrophy, nasal, turbinate 09/05/2017  . Perennial allergic rhinitis 09/05/2017  . Impingement syndrome of left shoulder 04/29/2017  . Chronic left shoulder pain 04/22/2017  . Strain of right hip adductor muscle 02/05/2017  . Statin intolerance 12/13/2016  . Vitamin D insufficiency 06/21/2016  . Elevated TSH 06/21/2016  . External hemorrhoids 05/18/2016  . Benign prostatic hyperplasia  L>R; without lower urinary tract symptoms 05/18/2016  . Health education/counseling 02/25/2016  . Restless legs syndrome (RLS) 02/15/2016  . Erectile dysfunction 02/15/2016  . h/o CLinically Signficant Anxiety 01/05/2014  . h/o Clinical depression 12/31/2013  . Hyperlipidemia 12/18/2013  . Genital Herpes 11/18/2013  . Cannot sleep 11/18/2013  . Extremity pain 11/18/2013  . Abnormal findings on diagnostic imaging of abdomen 10/29/2013  . 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 Polypharmacy 08/18/2013  . Other long term (current) drug therapy 08/18/2013  . Chronic pain 08/11/2013  . Other specified  disorders of urinary tract 07/09/2012  . Abnormality of gait 07/09/2012  . Lumbago 07/09/2012  . Disturbance of skin sensation 07/09/2012  . 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 Arthritis   . Chronic insomnia   . Chronic low back pain    Annular tear at the L2-3 and L4-5 levels 4  . Depression   . Gait disorder   . Hereditary spastic paraparesis (HCC)   .  Hyperlipidemia   . 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 Neurogenic bladder   . RLS (restless legs syndrome)     Family History  Problem Relation Age of Onset  . Cancer Mother        breast cancer  . Other Mother   . Hyperlipidemia Mother   . Stroke Father        Intracranial hemorrhage  . Other Brother   . Other Brother   . Other Brother   . Colon cancer Neg Hx   . Colon polyps Neg Hx   . Esophageal cancer Neg Hx   . Stomach cancer Neg Hx   . Rectal cancer Neg Hx     Past Surgical History:  Procedure Laterality Date  . APPENDECTOMY    . BREAST SURGERY Bilateral    cysts  . COLONOSCOPY    . KNEE ARTHROSCOPY     right  . SHOULDER SURGERY     Rotator cuff, left  . ULNAR TUNNEL RELEASE Left 09/10/2014   Procedure: LEFT CUBITAL TUNNEL RELEASE;  Surgeon: [name], MD;  Location: [facility];  Service: Orthopedics;  Laterality: Left;   Social History   Occupational History  . Occupation: Unemployed    Comment: Disability  Tobacco Use  . Smoking status: Never Smoker  . Smokeless tobacco: Never Used  Substance and Sexual Activity  . Alcohol use: Yes    Alcohol/week: 4.2 oz    Types: 7 Cans of beer per week    Comment: Consumes alcohol on occasion  . Drug use: No  . Sexual activity: Yes    Birth control/protection: Other-see comments    Comment: vasectomy

## 2017-12-11 ENCOUNTER — Ambulatory Visit (INDEPENDENT_AMBULATORY_CARE_PROVIDER_SITE_OTHER): Payer: No Typology Code available for payment source | Admitting: Physical Medicine and Rehabilitation

## 2017-12-11 ENCOUNTER — Encounter (INDEPENDENT_AMBULATORY_CARE_PROVIDER_SITE_OTHER): Payer: Self-pay | Admitting: Physical Medicine and Rehabilitation

## 2017-12-11 ENCOUNTER — Ambulatory Visit (INDEPENDENT_AMBULATORY_CARE_PROVIDER_SITE_OTHER): Payer: No Typology Code available for payment source

## 2017-12-11 DIAGNOSIS — M25511 Pain in right shoulder: Secondary | ICD-10-CM | POA: Diagnosis not present

## 2017-12-11 DIAGNOSIS — G8929 Other chronic pain: Secondary | ICD-10-CM

## 2017-12-11 MED FILL — BACLOFEN 10 MG TABLET: 10 | 30 days supply | Qty: 30 | Fill #2

## 2017-12-11 NOTE — Patient Instructions (Signed)

## 2017-12-11 NOTE — Progress Notes (Signed)
KALLON CAYLOR - 62 y.o. male MRN [id number]  Date of birth: June 20, 1956  Office Visit Note: Visit Date: 12/11/2017 PCP: [name], DO Referred by: [name], DO  Subjective: Chief Complaint  Patient presents with  . Right Shoulder - Pain   HPI: Mr. 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  He comes in today at the request of Dr. Roda Shutters for intra-articular glenohumeral joint anesthetic arthrogram on the right.  Prior subacromial injection did give him a few months of relief.  He has had an MRI of the shoulder since I have seen him.  This is reviewed below.  MRI OF THE RIGHT SHOULDER WITHOUT CONTRAST  TECHNIQUE: Multiplanar, multisequence MR imaging of the shoulder was performed. No intravenous contrast was administered.  COMPARISON:  None.  FINDINGS: Rotator cuff: Moderate rotator cuff tendinopathy/tendinosis mainly involving the supraspinatus tendon. No full-thickness retracted tear. Shallow articular surface tear involving the subscapularis tendon near its attachment site.  Muscles:  Normal  Biceps long head:  Intact  Acromioclavicular Joint: Mild degenerative changes. Widened AC joint may be due to prior [facility] joint separation. Type 2 acromion. No significant lateral downsloping or undersurface spurring.  Glenohumeral Joint: Small joint effusion. There is thickening of the capsular structures in the axillary recess which can be seen with adhesive capsulitis or synovitis.  Labrum:  Labral degenerative changes but no obvious tear.  Bones: No acute bony findings. Subchondral cystic change noted in the humeral head.  Other: Mild subacromial/subdeltoid bursitis.  IMPRESSION: 1. Moderate rotator cuff tendinopathy/tendinosis but no full-thickness retracted tear. 2. Intact long head biceps tendon and glenoid labrum. Labral degenerative changes are noted. 3. No significant findings for bony  impingement. 4. There is thickening of the capsular structures in the axillary recess which can be seen with adhesive capsulitis or synovitis. 5. Mild subacromial/subdeltoid bursitis.   Electronically Signed   By: Rudie Meyer M.D.   On: 11/26/2017 15:25    ROS Otherwise per HPI.  Assessment & Plan: Visit Diagnoses:  1. Chronic right shoulder pain     Plan: No additional findings.   Meds & Orders: No orders of the defined types were placed in this encounter.   Orders Placed This Encounter  Procedures  . Large Joint Inj: R glenohumeral  . XR C-ARM NO REPORT    Follow-up: Return if symptoms worsen or fail to improve, for Dr. Roda Shutters.   Procedures: Large Joint Inj: R glenohumeral on 12/11/2017 1:25 PM Indications: pain and diagnostic evaluation Details: 22 G 3.5 in needle, anteromedial approach  Arthrogram: Yes  Medications: 80 mg triamcinolone acetonide 40 MG/ML; 3 mL bupivacaine 0.5 %  Arthrogram demonstrated excellent flow of contrast throughout the joint surface without extravasation or obvious defect.  The patient had relief of symptoms during the anesthetic phase of the injection.  Procedure, treatment alternatives, risks and benefits explained, specific risks discussed. Consent was given by the patient. Immediately prior to procedure a time out was called to verify the correct patient, procedure, equipment, support staff and site/side marked as required. Patient was prepped and draped in the usual sterile fashion.      No notes on file   Clinical History: MRI LUMBAR SPINE WITHOUT CONTRAST   Technique: Multiplanar and multiecho pulse sequences of the lumbar spine were obtained without intravenous contrast.   Comparison: None.   Findings: The numbering convention used for this exam terms L5-S1 as the last full intervertebral disc space above the sacrum. Dextroconvex  lumbar scoliosis is present with the apex at L3. Vertebral body height is preserved. Degenerative  endplate changes are present most pronounced on both sides of L2-L3 and involving the right aspect of L4-L5. Spinal cord terminates posterior to L1. Paraspinal soft tissues grossly appear within normal limits.   T12-L1: Sagittal imaging only. Negative.   L1-L2: Disc desiccation and degeneration with circumferential disc bulge. Broad-based posterior bulging with minimal impression on the thecal sac but no significant stenosis.   L2-L3: Disc desiccation with circumferential disc bulge. Posterior broad-based bulge is left eccentric with mild left foraminal stenosis. Bulging disc contacts the exiting left L2 nerve root in the foramen.   L3-L4: Mild disc degeneration with shallow broad-based posterior bulging. Central canal and lateral recesses patent. Foramina patent.   L4-L5: Disc desiccation and degeneration with circumferential disc bulging. Mild to moderate right foraminal stenosis associated with endplate osteophytes and disc bulge. This potentially affects the right L4 nerve. Left foramen, central canal patent. Mild narrowing of the right lateral recess potentially affecting the descending right L5 nerve. Mild right facet degeneration.   L5-S1: Negative.   IMPRESSION: 1. L2-L3 disc desiccation with circumferential disc bulge. Left eccentric broad-based posterior bulging with mild left foraminal stenosis. Bulging disc contacts the exiting left L2 nerve in the foramen. 2. L4-L5 disc desiccation and degeneration with circumferential bulge. Mild to moderate right foraminal stenosis is multifactorial, associated with endplate spurring and disc. 3. Dextroconvex lumbar scoliosis with the apex at L3.   He reports that he has never smoked. He has never used smokeless tobacco.  Recent Labs    06/11/17 0906  HGBA1C 4.9    Objective:  VS:  HT:    WT:   BMI:     BP:   HR: bpm  TEMP: ( )  RESP:  Physical Exam  Ortho Exam Imaging: Xr C-arm No Report  Result Date:  12/11/2017 Please see Notes or Procedures tab for imaging impression.   Past Medical/Family/Surgical/Social History: Medications & Allergies reviewed per EMR, new medications updated. Patient Active Problem List   Diagnosis Date Noted  . Insomnia 11/26/2017  . Habitual snoring 11/26/2017  . Mood disorder (HCC) 11/26/2017  . Chronic pansinusitis 09/25/2017  . Deviated septum 09/05/2017  . Hypertrophy, nasal, turbinate 09/05/2017  . Perennial allergic rhinitis 09/05/2017  . Impingement syndrome of left shoulder 04/29/2017  . Chronic left shoulder pain 04/22/2017  . Strain of right hip adductor muscle 02/05/2017  . Statin intolerance 12/13/2016  . Vitamin D insufficiency 06/21/2016  . Elevated TSH 06/21/2016  . External hemorrhoids 05/18/2016  . Benign prostatic hyperplasia  L>R; without lower urinary tract symptoms 05/18/2016  . Health education/counseling 02/25/2016  . Restless legs syndrome (RLS) 02/15/2016  . Erectile dysfunction 02/15/2016  . h/o CLinically Signficant Anxiety 01/05/2014  . h/o Clinical depression 12/31/2013  . Hyperlipidemia 12/18/2013  . Genital Herpes 11/18/2013  . Cannot sleep 11/18/2013  . Extremity pain 11/18/2013  . Abnormal findings on diagnostic imaging of abdomen 10/29/2013  . 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 Polypharmacy 08/18/2013  . Other long term (current) drug therapy 08/18/2013  . Chronic pain 08/11/2013  . Other specified disorders of urinary tract 07/09/2012  . Abnormality of gait 07/09/2012  . Lumbago 07/09/2012  . Disturbance of skin sensation 07/09/2012  . 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 Arthritis   . Chronic insomnia   . Chronic low back pain    Annular tear at the L2-3  and L4-5 levels 4  . Depression   . Gait disorder   . Hereditary spastic paraparesis (HCC)   . Hyperlipidemia   . 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 Neurogenic bladder   .  RLS (restless legs syndrome)    Family History  Problem Relation Age of Onset  . Cancer Mother        breast cancer  . Other Mother   . Hyperlipidemia Mother   . Stroke Father        Intracranial hemorrhage  . Other Brother   . Other Brother   . Other Brother   . Colon cancer Neg Hx   . Colon polyps Neg Hx   . Esophageal cancer Neg Hx   . Stomach cancer Neg Hx   . Rectal cancer Neg Hx    Past Surgical History:  Procedure Laterality Date  . APPENDECTOMY    . BREAST SURGERY Bilateral    cysts  . COLONOSCOPY    . KNEE ARTHROSCOPY     right  . SHOULDER SURGERY     Rotator cuff, left  . ULNAR TUNNEL RELEASE Left 09/10/2014   Procedure: LEFT CUBITAL TUNNEL RELEASE;  Surgeon: [name], MD;  Location: [facility];  Service: Orthopedics;  Laterality: Left;   Social History   Occupational History  . Occupation: Unemployed    Comment: Disability  Tobacco Use  . Smoking status: Never Smoker  . Smokeless tobacco: Never Used  Substance and Sexual Activity  . Alcohol use: Yes    Alcohol/week: 4.2 oz    Types: 7 Cans of beer per week    Comment: Consumes alcohol on occasion  . Drug use: No  . Sexual activity: Yes    Birth control/protection: Other-see comments    Comment: vasectomy

## 2017-12-11 NOTE — Progress Notes (Signed)
 .  Numeric Pain Rating Scale and Functional Assessment Average Pain 7   In the last MONTH (on 0-10 scale) has pain interfered with the following?  1. General activity like being  able to carry out your everyday physical activities such as walking, climbing stairs, carrying groceries, or moving a chair?  Rating(5)  -Dye Allergies.  

## 2017-12-12 MED ORDER — TRIAMCINOLONE ACETONIDE 40 MG/ML IJ SUSP
80.0000 mg | INTRAMUSCULAR | Status: AC | PRN
  Administered 2017-12-11: 80 mg via INTRA_ARTICULAR

## 2017-12-12 MED ORDER — BUPIVACAINE HCL 0.5 % IJ SOLN
3.0000 mL | INTRAMUSCULAR | Status: AC | PRN
  Administered 2017-12-11: 3 mL via INTRA_ARTICULAR

## 2017-12-17 ENCOUNTER — Telehealth (INDEPENDENT_AMBULATORY_CARE_PROVIDER_SITE_OTHER): Payer: Self-pay | Admitting: *Deleted

## 2017-12-17 NOTE — Telephone Encounter (Signed)
Diane with [facility] called left vm on my vm stating they are seeing this pt of Dr. Roda Shutters and needs Demo, Ins information faxed over to them. I faxed requested information to [phone]

## 2017-12-19 ENCOUNTER — Ambulatory Visit (INDEPENDENT_AMBULATORY_CARE_PROVIDER_SITE_OTHER): Payer: No Typology Code available for payment source | Admitting: Family Medicine

## 2017-12-19 ENCOUNTER — Encounter: Payer: Self-pay | Admitting: Family Medicine

## 2017-12-19 VITALS — BP 135/82 | HR 106 | Ht 66.75 in | Wt 138.0 lb

## 2017-12-19 DIAGNOSIS — R502 Drug induced fever: Secondary | ICD-10-CM | POA: Diagnosis not present

## 2017-12-19 DIAGNOSIS — R232 Flushing: Secondary | ICD-10-CM

## 2017-12-19 NOTE — Progress Notes (Signed)
Acute Care Office visit  Assessment and plan:  1. Skin flushing   2. Drug-induced fever-  likely due to steroid     1. Skin blushing/flushing 2. Drug-induced fever  -likely due to recent procedure done on 12-11-17.   -Do not take more than  tylenol in any given day.  -Push fluids, get plenty of rest.   -do full body scans to ensure you do not develop any new rashes or ticks.   - Supportive care and various OTC medications discussed in addition to any prescribed.   - Call or RTC if new symptoms, or if no improvement or worse over next several days.      No orders of the defined types were placed in this encounter.   No orders of the defined types were placed in this encounter.   Gross side effects, risk and benefits, and alternatives of medications discussed with patient.  Patient is aware that all medications have potential side effects and we are unable to predict every sideeffect or drug-drug interaction that may occur.  Expresses verbal understanding and consents to current therapy plan and treatment regiment.   Education and routine counseling performed. Handouts provided.  Anticipatory guidance and routine counseling done re: condition, txmnt options and need for follow up. All questions of patient's were answered.  Return for F-up of current med issues as previously d/c pt.  Please see AVS handed out to patient at the end of our visit for additional patient instructions/ counseling done pertaining to today's office visit.  Note: This document was partially repared using Dragon voice recognition software and may include unintentional dictation errors.   This document serves as a record of services personally performed by [name], DO. It was created on her behalf by Thelma Barge, a trained medical scribe. The creation of this record is based on the scribe's personal observations and the provider's statements to them.   I have reviewed the  above medical documentation for accuracy and completeness and I concur.  [name] 12/20/17 10:05 AM    Subjective:    Chief Complaint  Patient presents with  . Fever    fevers, night sweats, and weakness after injections     HPI:  Pt presents with Sx for 5-7 days   C/o: fever and chills that comes and goes and is worse at night (100.7), sweats, weakness 2 days after injection in his R shoulder with Dr. Alvester Morin on 12-12-17. He has associated sore throat, cough, cramping, and flushing and redness of skin that also comes and goes.    He started 5 days ago he started having sweats.  He denies any tick bites.  Denies: any new joint pain beyond baseline.     For symptoms patient has tried:  Ice pack, tylenol every 4-5 hours.   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   Overall getting:   Slowly improved.    Patient Care Team    Relationship Specialty Notifications Start End  [name], DO PCP - General Family Medicine  02/15/16   Tyrell Antonio, MD Consulting Physician Physical Medicine and Rehabilitation  05/22/16   York Spaniel, MD Consulting Physician Neurology  05/22/16   Specialists, [facility]  Orthopedic Surgery  12/11/16   Suzanna Obey, MD Consulting Physician Otolaryngology  10/04/17   Iva Boop, MD Consulting Physician Gastroenterology  10/04/17   Drema Halon, MD Consulting Physician Otolaryngology  10/04/17   Vernie Ammons,  Loraine Leriche, MD Consulting Physician Urology  10/04/17     Past medical history, Surgical history, Family history reviewed and noted below, Social history, Allergies, and Medications have been entered into the medical record, reviewed and changed as needed.   Allergies  Allergen Reactions  . Pregabalin Other (See Comments)    Intolerance, somnolence  . Statins Other (See Comments)    Other reaction(s): Muscle Pain Other reaction(s): Muscle Pain Other reaction(s): Muscle Pain      Review of Systems: - see above HPI for pertinent positives General:   No F/C, wt loss Pulm:   No DIB, pleuritic chest pain Card:  No CP, palpitations Abd:  No n/v/d or pain Ext:  No inc edema from baseline   Objective:   Blood pressure 135/82, pulse (!) 106, height 5' 6.75" (1.695 m), weight 138 lb (62.6 kg), SpO2 98 %. Body mass index is 21.78 kg/m. General: Well Developed, well nourished, appropriate for stated age.  Neuro: Alert and oriented x3, extra-ocular muscles intact, sensation grossly intact.  HEENT: Normocephalic, atraumatic, pupils equal round reactive to light, neck supple, no masses, no painful lymphadenopathy, TM's intact B/L, no acute findings. Nares- patent, clear d/c, OP- clear, mild erythema, No TTP sinuses Skin: Warm and dry, no gross rash.  Cardiac: RRR, S1 S2,  no murmurs rubs or gallops.  Respiratory: ECTA B/L and A/P, Not using accessory muscles, speaking in full sentences- unlabored. Vascular:  No gross lower ext edema, cap RF less 2 sec. Psych: No HI/SI, judgement and insight good, Euthymic mood. Full Affect.

## 2017-12-20 ENCOUNTER — Telehealth (INDEPENDENT_AMBULATORY_CARE_PROVIDER_SITE_OTHER): Payer: Self-pay

## 2017-12-20 NOTE — Telephone Encounter (Signed)
See message below °

## 2017-12-20 NOTE — Telephone Encounter (Signed)
Pt's wife called and states that since right shoulder injection with FN on 12/11/17 pt has nausea, chills, increased temp, weakness and decreased sleep. She states that they went to the PCP yesterday and they sate could be from injection and want to know dose of medication. I advised I would forward this information for a call back today. cb # [phone]

## 2017-12-23 ENCOUNTER — Other Ambulatory Visit: Payer: Self-pay | Admitting: Family Medicine

## 2017-12-23 MED FILL — rOPINIRole HCL 2 MG TABS: 2 | 90 days supply | Qty: 45 | Fill #0

## 2017-12-23 NOTE — Telephone Encounter (Signed)
My note with dosages was/ is in chart, although the note reads 80 mg of triamcinolone, it is actually more like 40 to  dose - fairly standard. Some part may have been intra-vascular (not seen with contrast) and this could make effect stronger. If really steroid induced should diminish. I read his PCP's notes

## 2017-12-23 NOTE — Telephone Encounter (Signed)
Called patient to advise  °

## 2018-01-08 ENCOUNTER — Encounter: Payer: Self-pay | Admitting: Family Medicine

## 2018-01-08 ENCOUNTER — Ambulatory Visit (INDEPENDENT_AMBULATORY_CARE_PROVIDER_SITE_OTHER): Payer: No Typology Code available for payment source | Admitting: Family Medicine

## 2018-01-08 VITALS — BP 102/68 | HR 86 | Ht 67.0 in | Wt 136.8 lb

## 2018-01-08 DIAGNOSIS — R7989 Other specified abnormal findings of blood chemistry: Secondary | ICD-10-CM | POA: Diagnosis not present

## 2018-01-08 DIAGNOSIS — G114 Hereditary spastic paraplegia: Secondary | ICD-10-CM

## 2018-01-08 DIAGNOSIS — G2581 Restless legs syndrome: Secondary | ICD-10-CM | POA: Diagnosis not present

## 2018-01-08 DIAGNOSIS — E785 Hyperlipidemia, unspecified: Secondary | ICD-10-CM | POA: Diagnosis not present

## 2018-01-08 DIAGNOSIS — F39 Unspecified mood [affective] disorder: Secondary | ICD-10-CM | POA: Diagnosis not present

## 2018-01-08 DIAGNOSIS — G4700 Insomnia, unspecified: Secondary | ICD-10-CM | POA: Diagnosis not present

## 2018-01-08 DIAGNOSIS — R0683 Snoring: Secondary | ICD-10-CM

## 2018-01-08 DIAGNOSIS — E7800 Pure hypercholesterolemia, unspecified: Secondary | ICD-10-CM

## 2018-01-08 MED ORDER — TRAZODONE HCL 100 MG PO TABS: ORAL_TABLET | ORAL | 3 refills | Status: DC

## 2018-01-08 MED ORDER — DULOXETINE HCL 20 MG PO CPEP: 20.0000 mg | ORAL_CAPSULE | Freq: Every day | ORAL | 1 refills | Status: DC

## 2018-01-08 MED FILL — DULoxetine HCL 20 MG CPEP: 20 | 30 days supply | Qty: 60 | Fill #0

## 2018-01-08 NOTE — Progress Notes (Signed)
Pt here for an acute care OV today   Impression and Recommendations:    1. Mood disorder (HCC)   2. Hereditary spastic paraparesis (HCC)   3. Insomnia, unspecified type   4. Restless legs syndrome (RLS)   5. Habitual snoring   6. Hyperlipidemia, unspecified hyperlipidemia type   7. Elevated low density lipoprotein (LDL) cholesterol level   8. Elevated TSH    1. Mood disorder -Start cymbalta, to which pt agreed. -D/C Prozac. -Pt tried prozac but did not tolerate well due to SE. He stopped taking this after 2 weeks.  -FUP 4 weeks.   2. Hereditary spastic paraparesis -Sx remain unchanged.  -we discussed with him safety concerns as he becomes weaker. He has bought a new wheeled walker and understands his limitations -Pt remains under treatment of neuro and PMNR.  3. Insomnia -dose change: increase trazodone to 100mg  qhs. Pt understands he can take 0.5-1 tablets as needed for sleep.  -per pt, sleep has somewhat improved after starting trazodone. Continue meds as listed below for sleep.   4. RLS -Sx well controlled. Continue Requip.   5. HLD/Elevated LDL -check labs. Pt is intolerant to statins. Prudent diet/exercise discussed.  6. Elevated TSH -Check labs. Add T3, T4, and TSH.      Meds ordered this encounter  Medications  . DULoxetine (CYMBALTA) 20 MG capsule    Sig: Take 1 capsule (20 mg total) by mouth daily. for 1 week then 1 capsule every 12 hrs.    Dispense:  60 capsule    Refill:  1  . traZODone (DESYREL) 100 MG tablet    Sig: 0.5-1 tablet p.o. nightly as needed sleep    Dispense:  90 tablet    Refill:  3    Orders Placed This Encounter  Procedures  . TSH+T4F+T3Free  . Lipid panel     Education and routine counseling performed. Handouts provided  Gross side effects, risk and benefits, and alternatives of medications and treatment plan in general discussed with patient.  Patient is aware that all medications have potential side effects and we  are unable to predict every side effect or drug-drug interaction that may occur.   Patient will call with any questions prior to using medication if they have concerns.  Expresses verbal understanding and consents to current therapy and treatment regimen.  No barriers to understanding were identified.  Red flag symptoms and signs discussed in detail.  Patient expressed understanding regarding what to do in case of emergency\urgent symptoms   Please see AVS handed out to patient at the end of our visit for further patient instructions/ counseling done pertaining to today's office visit.   Return for -f/up 6 wks after starting cymbalta and inc trazadone.     Note: This document was prepared occasionally using Dragon voice recognition software and may include unintentional dictation errors in addition to a scribe.   This document serves as a record of services personally performed by [name], DO. It was created on her behalf by Thelma Barge, a trained medical scribe. The creation of this record is based on the scribe's personal observations and the provider's statements to them.   I have reviewed the above medical documentation for accuracy and completeness and I concur.  [name] 01/12/18 9:50 PM   -------------------------------------------------------------------------------------   Subjective:    CC:  Chief Complaint  Patient presents with  . Follow-up    HPI: Terry Howard is a 62 y.o. male who [facility] FUP of mood and sleep after starting prozac and trazodone.  R shoulder with flushing s/p injection Pt had a R shoulder injection with orthopedist and has flushing with fever afterwards. He was seen in the office here acutely for this. It took a week for these sx to clear up after the injection. He was doing [facility]. His PT therapist is Thurston Poundsrey. He is also using a walker to help  him get around.   Hereditary spastic paraparesis He reports having difficulty moving around and doing other activities around the house and for his job. He has tried to stay active. He has an extensive FMHx of this disease as well.   ED He has these Sx unless he takes a shot. He is able to get an erection and have sex with his wife when he does this.   Mood From last chronic OV we started him on trazodone and prozac.   After starting these, he felt "bad" on prozac. He took it for 2 weeks and felt sluggish, tired, and anxious, so he stopped.   He has chronic insomnia for 30 years and has tried several medications. Has been to 3 sleep centers in the past.  [facility]  Was prescribed Requip for restless leg syndrome.  Next went to [facility] and was prescribed Ambien.  Notes that Ambien worked "a little bit," but no longer takes it.  Trazodone helped him for sleep. He has no SE of starting this meds and does not feel drowsy. It increases his appetite. He would fall asleep but get up after 2-3 hours. After he stopped prozac he is sleeping for a few more hours.    [facility] 01/08/2018 11/26/2017 10/04/2017 06/04/2017 04/22/2017  Decreased Interest 0 1 0 1 0  Down, Depressed, Hopeless 0 1 1 0 0  PHQ - 2 Score 0 2 1 1  0  Altered sleeping 1 3 1 2  -  Tired, decreased energy 0 2 1 1  -  Change in appetite 0 2 0 1 -  Feeling bad or failure about yourself  0 1 0 0 -  Trouble concentrating 0 1 0 0 -  Moving slowly or fidgety/restless 0 2 1 0 -  Suicidal thoughts 0 0 0 0 -  PHQ-9 Score 1 13 4 5  -  Difficult doing work/chores Not difficult at all Somewhat difficult Somewhat difficult Not difficult at all -    No problems updated.   Wt Readings from Last 3 Encounters:  01/08/18 136 lb 12.8 oz (62.1 kg)  12/19/17 138 lb (62.6 kg)  11/26/17 144 lb 9.6 oz (65.6 kg)   BP Readings from Last 3 Encounters:  01/08/18 102/68  12/19/17 135/82  11/26/17  130/88   BMI Readings from Last 3 Encounters:  01/08/18 21.43 kg/m  12/19/17 21.78 kg/m  11/26/17 22.82 kg/m     Patient Care Team    Relationship Specialty Notifications Start End  Thomasene Lotpalski, Kyliegh Jester, DO PCP - General Family Medicine  02/15/16   Tyrell AntonioNewton, Frederic, MD Consulting Physician Physical Medicine and Rehabilitation  05/22/16   York SpanielWillis, Charles K, MD Consulting Physician Neurology  05/22/16   Specialists, [facility]  12/11/16   Suzanna ObeyByers, John, MD Consulting Physician Otolaryngology  10/04/17   Iva BoopGessner, Carl E, MD Consulting Physician Gastroenterology  10/04/17   Drema HalonNewman, Christopher E, MD Consulting Physician Otolaryngology  10/04/17   Ihor Gully, MD Consulting Physician Urology  10/04/17      Patient Active Problem List   Diagnosis Date Noted  . Statin intolerance 12/13/2016    Priority: High  . Restless legs syndrome (RLS) 02/15/2016    Priority: High  . Hyperlipidemia 12/18/2013    Priority: High  . 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 Elevated TSH 06/21/2016    Priority: Medium  . Vitamin D insufficiency 06/21/2016    Priority: Low  . Abnormality of gait 07/09/2012    Priority: Low  . Insomnia 11/26/2017  . Habitual snoring 11/26/2017  . Mood disorder (HCC) 11/26/2017  . Chronic pansinusitis 09/25/2017  . Deviated septum 09/05/2017  . Hypertrophy, nasal, turbinate 09/05/2017  . Perennial allergic rhinitis 09/05/2017  . Impingement syndrome of left shoulder 04/29/2017  . Chronic left shoulder pain 04/22/2017  . Strain of right hip adductor muscle 02/05/2017  . External hemorrhoids 05/18/2016  . Benign prostatic hyperplasia  L>R; without lower urinary tract symptoms 05/18/2016  . Health education/counseling 02/25/2016  . Erectile dysfunction 02/15/2016  . h/o CLinically Signficant Anxiety 01/05/2014  . h/o Clinical depression 12/31/2013  . Genital Herpes 11/18/2013  . Cannot sleep 11/18/2013  .  Extremity pain 11/18/2013  . Abnormal findings on diagnostic imaging of abdomen 10/29/2013  . Polypharmacy 08/18/2013  . Other long term (current) drug therapy 08/18/2013  . Chronic pain 08/11/2013  . Other specified disorders of urinary tract 07/09/2012  . Lumbago 07/09/2012  . Disturbance of skin sensation 07/09/2012  . 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    Current Meds  Medication Sig  . acyclovir (ZOVIRAX) 800 MG tablet TAKE 1 TABLET (800 MG TOTAL) BY MOUTH DAILY.  . baclofen (LIORESAL) 10 MG tablet Take 1 tablet (10 mg total) by mouth at bedtime.  . baclofen (LIORESAL) 10 MG tablet Take 1 tablet (10 mg total) by mouth at bedtime.  . Cholecalciferol (VITAMIN D3) 5000 units TABS 5,000 IU OTC vitamin D3 daily.  Marland Kitchen omega-3 acid ethyl esters (LOVAZA) 1 g capsule Take 2 capsules (2 g total) by mouth 2 (two) times daily.  Marland Kitchen rOPINIRole (REQUIP) 2 MG tablet TAKE 1/2 TABLET BY MOUTH DAILY.  . traZODone (DESYREL) 100 MG tablet 0.5-1 tablet p.o. nightly as needed sleep  . Turmeric Curcumin 500 MG CAPS Take 1 capsule by mouth daily.  . [DISCONTINUED] traZODone (DESYREL) 50 MG tablet 0.5-1 tablet p.o. nightly as needed sleep   Current Facility-Administered Medications for the 01/08/18 encounter (Office Visit) with [name], DO  Medication  . 0.9 %  sodium chloride infusion  . betamethasone acetate-betamethasone sodium phosphate (CELESTONE) injection 12 mg    Allergies:  Allergies  Allergen Reactions  . Pregabalin Other (See Comments)    Intolerance, somnolence  . Statins Other (See Comments)    Other reaction(s): Muscle Pain Other reaction(s): Muscle Pain Other reaction(s): Muscle Pain      Review of Systems: General:   Denies fever, chills, unexplained weight loss.  Optho/Auditory:    Denies visual changes, blurred vision/LOV Respiratory:   Denies wheeze, DOE more than baseline levels.  Cardiovascular:   Denies chest pain, palpitations, new onset peripheral edema  Gastrointestinal:   Denies nausea, vomiting, diarrhea, abd pain.  Genitourinary: Denies dysuria, freq/ urgency, flank pain or discharge from genitals.  Endocrine:  Denies hot or cold intolerance, polyuria, polydipsia. Musculoskeletal:   Denies unexplained myalgias, joint swelling, unexplained arthralgias, gait problems.  Skin:  Denies new onset rash, suspicious lesions Neurological:     Denies dizziness, unexplained weakness, numbness  Psychiatric/Behavioral:   Denies mood changes, suicidal or homicidal ideations, hallucinations    Objective:   Blood pressure 102/68, pulse 86, height 5\' 7"  (1.702 m), weight 136 lb 12.8 oz (62.1 kg), SpO2 98 %. Body mass index is 21.43 kg/m. General:  Well Developed, well nourished, appropriate for stated age.  Neuro:  Alert and oriented,  extra-ocular muscles intact  HEENT:  Normocephalic, atraumatic, neck supple Skin:  no gross rash, warm, pink. Cardiac:  RRR, S1 S2 Respiratory:  ECTA B/L and A/P, Not using accessory muscles, speaking in full sentences- unlabored. Vascular:  Ext warm, no cyanosis apprec.; cap RF less 2 sec. Psych:  No HI/SI, judgement and insight good, Euthymic mood. Full Affect.

## 2018-01-08 NOTE — Patient Instructions (Signed)
For the Cymbalta or new mood medicine I am giving you take 1 capsule daily for 1 week then take 1 capsule twice daily until I see you again in 6wks

## 2018-01-09 LAB — LIPID PANEL
CHOL/HDL RATIO: 4.6 ratio (ref 0.0–5.0)
Cholesterol, Total: 185 mg/dL (ref 100–199)
HDL: 40 mg/dL (ref >39)
LDL CALC: 124 mg/dL — AB (ref 0–99)
TRIGLYCERIDES: 103 mg/dL (ref 0–149)
VLDL Cholesterol Cal: 21 mg/dL (ref 5–40)

## 2018-01-09 LAB — TSH+T4F+T3FREE
FREE T4: 1.32 ng/dL (ref 0.82–1.77)
T3, Free: 3.1 pg/mL (ref 2.0–4.4)
TSH: 3.9 u[IU]/mL (ref 0.450–4.500)

## 2018-01-13 MED FILL — BACLOFEN 10 MG TABLET: 10 | 30 days supply | Qty: 30 | Fill #3

## 2018-02-12 ENCOUNTER — Other Ambulatory Visit: Payer: Self-pay | Admitting: Family Medicine

## 2018-02-12 MED FILL — ACYCLOVIR 800 MG TABLET: 800 | 90 days supply | Qty: 90 | Fill #0

## 2018-02-12 MED FILL — BACLOFEN 10 MG TABLET: 10 | 90 days supply | Qty: 90 | Fill #0

## 2018-02-18 ENCOUNTER — Encounter: Payer: Self-pay | Admitting: Family Medicine

## 2018-02-18 ENCOUNTER — Ambulatory Visit (INDEPENDENT_AMBULATORY_CARE_PROVIDER_SITE_OTHER): Payer: No Typology Code available for payment source | Admitting: Family Medicine

## 2018-02-18 VITALS — BP 104/75 | HR 73 | Ht 67.0 in | Wt 141.0 lb

## 2018-02-18 DIAGNOSIS — E785 Hyperlipidemia, unspecified: Secondary | ICD-10-CM

## 2018-02-18 DIAGNOSIS — F39 Unspecified mood [affective] disorder: Secondary | ICD-10-CM

## 2018-02-18 DIAGNOSIS — Z789 Other specified health status: Secondary | ICD-10-CM

## 2018-02-18 DIAGNOSIS — E559 Vitamin D deficiency, unspecified: Secondary | ICD-10-CM

## 2018-02-18 DIAGNOSIS — F32A Depression, unspecified: Secondary | ICD-10-CM

## 2018-02-18 DIAGNOSIS — G114 Hereditary spastic paraplegia: Secondary | ICD-10-CM

## 2018-02-18 DIAGNOSIS — R7989 Other specified abnormal findings of blood chemistry: Secondary | ICD-10-CM

## 2018-02-18 DIAGNOSIS — G4700 Insomnia, unspecified: Secondary | ICD-10-CM | POA: Diagnosis not present

## 2018-02-18 DIAGNOSIS — F329 Major depressive disorder, single episode, unspecified: Secondary | ICD-10-CM | POA: Diagnosis not present

## 2018-02-18 DIAGNOSIS — G2581 Restless legs syndrome: Secondary | ICD-10-CM | POA: Diagnosis not present

## 2018-02-18 MED ORDER — DULOXETINE HCL 20 MG PO CPEP: 20.0000 mg | ORAL_CAPSULE | Freq: Two times a day (BID) | ORAL | 1 refills | Status: DC

## 2018-02-18 MED ORDER — TRAZODONE HCL 100 MG PO TABS: ORAL_TABLET | ORAL | 3 refills | Status: DC

## 2018-02-18 MED FILL — traZODone HCL 100 MG TABS: 100 | 90 days supply | Qty: 90 | Fill #0

## 2018-02-18 MED FILL — DULoxetine HCL 20 MG CPEP: 20 | 90 days supply | Qty: 180 | Fill #0

## 2018-02-18 NOTE — Progress Notes (Signed)
Impression and Recommendations:    1. Mood disorder (HCC)   2. Depression, unspecified depression type   3. Insomnia, unspecified type   4. Vitamin D insufficiency   5. Elevated TSH   6. Hereditary spastic paraparesis (HCC)   7. Hyperlipidemia, unspecified hyperlipidemia type   8. Statin intolerance   9. Restless legs syndrome (RLS)     1. Mood Disorder - Continue Cymbalta as prescribed, 20 twice daily.  See med list below.  - Patient confirms that his symptoms are better controlled on this medication. - In the past, patient tried prozac but did not tolerate well due to SE.  - Encouraged patient to continue positive thinking, focusing on current projects, what he is doing in the present, and not what he is incapable of doing.  2. Hereditary spastic paraparesis - Sx remain unchanged.  - Pt remains under treatment of neuro and PMNR. - Per pt, Dr. Alvester Morin monitors his back and injections.  - Encouraged patient to tell his neurologist at next follow-up that he is now on Cymbalta and his trazodone was increased.  3. Insomnia - Dose change last appointment, increased trazodone to 100mg  qhs.  - Pt understands he can take 0.5-1 tablets as needed for sleep.  - Per pt, sleep has somewhat improved after starting trazodone.  - Continue meds as listed below.  4. RLS - Sx well controlled. Continue Requip.   5. HLD/Elevated LDL - Patient's cholesterol is stable at this time. - Pt is intolerant to statins.  Prudent diet/exercise discussed.  6. H/o elevated TSH - Normalized.  Patient not symptomatic and T4 WNL.  7. BMI Counseling Explained to patient what BMI refers to, and what it means medically.    Told patient to think about it as a "medical risk stratification measurement" and how increasing BMI is associated with increasing risk/ or worsening state of various diseases such as hypertension, hyperlipidemia, diabetes, premature OA, depression etc.  American Heart  Association guidelines for healthy diet, basically Mediterranean diet, and exercise guidelines of 30 minutes 5 days per week or more discussed in detail.  Health counseling performed.  All questions answered.  8. Lifestyle & Preventative Health Maintenance - Advised patient to continue working toward exercising to improve overall mental, physical, and emotional health.    - Encouraged patient to continue exercising using lower weights and higher reps.  - Engage in daily physical activity.  Recommended that the patient eventually strive for at least 150 minutes of moderate cardiovascular activity per week according to guidelines established by the [facility].   - Healthy dietary habits encouraged, including low-carb, and high amounts of lean protein in diet.   - Patient should also consume adequate amounts of water - half of body weight in oz of water per day.  9. Follow-Up - Continue to follow up with neurology and other specialists.  - Return as scheduled for chronic health maintenance visits.   No orders of the defined types were placed in this encounter.   Meds ordered this encounter  Medications  . DULoxetine (CYMBALTA) 20 MG capsule    Sig: Take 1 capsule (20 mg total) by mouth 2 (two) times daily.    Dispense:  180 capsule    Refill:  1  . traZODone (DESYREL) 100 MG tablet    Sig: 0.5-1 tablet p.o. nightly as needed sleep    Dispense:  90 tablet    Refill:  3    Gross side effects, risk and benefits, and  alternatives of medications and treatment plan in general discussed with patient.  Patient is aware that all medications have potential side effects and we are unable to predict every side effect or drug-drug interaction that may occur.   Patient will call with any questions prior to using medication if they have concerns.  Expresses verbal understanding and consents to current therapy and treatment regimen.  No barriers to understanding were identified.  Red flag symptoms and  signs discussed in detail.  Patient expressed understanding regarding what to do in case of emergency\urgent symptoms  Please see AVS handed out to patient at the end of our visit for further patient instructions/ counseling done pertaining to today's office visit.   Return for keep f/up chronic ov with me 3-4 mo.     Note: This document was prepared using Dragon voice recognition software and may include unintentional dictation errors.  This document serves as a record of services personally performed by [name], DO. It was created on her behalf by Peggye Fothergill, a trained medical scribe. The creation of this record is based on the scribe's personal observations and the provider's statements to them.   I have reviewed the above medical documentation for accuracy and completeness and I concur.  [name] 02/18/18 1:20 PM   --------------------------------------------------------------------------------------------------------------------------------------------------------------------------------------------------------------------------------------------    Subjective:    CC:  Chief Complaint  Patient presents with  . Follow-up    HPI: Terry Howard is a 62 y.o. male who presents to [facility] today for follow-up of mood.   Patient seems brighter during appointment, more focused on what he is doing than what he can't do.  States work has been slowing down, so he hasn't been too busy.  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  He finished rehab the other day; is doing exercises every other day to keep his muscles toned.    Increased Trazodone & Beginning Cymbalta Here today to follow up after starting Cymbalta and increasing trazodone.  Patient feels 15-20% "better" than he did before.  Has been  taking the Cymbalta twice daily for about five days.  Notes that the trazodone gives him more of an appetite and helps him sleep better.  Patient started taking the Cymbalta at night because notes it was "slowing him down and making him sleepy."  Still gets up at around 2 or 3 o'clock in the morning, then lays down and naps for another hour or two.  States that he takes one Cymbalta at night and one in the morning.  Hereditary Spastic Paraparesis Confirms that the Cymbalta helps "knock out" his muscle spasms once or twice per week.    Sees his neurologist at the end of this month.  Patient continues to take baclofen and wishes to talk to his neurologist about his condition changing as he ages.    Depression screen [facility] 2/9 02/18/2018 01/08/2018 11/26/2017  Decreased Interest 0 0 1  Down, Depressed, Hopeless 0 0 1  PHQ - 2 Score 0 0 2  Altered sleeping 1 1 3   Tired, decreased energy 1 0 2  Change in appetite 0 0 2  Feeling bad or failure about yourself  0 0 1  Trouble concentrating 0 0 1  Moving slowly or fidgety/restless 0 0 2  Suicidal thoughts 0 0 0  PHQ-9  Score 2 1 13   Difficult doing work/chores Not difficult at all Not difficult at all Somewhat difficult     No flowsheet data found.   Wt Readings from Last 3 Encounters:  02/18/18 141 lb (64 kg)  01/08/18 136 lb 12.8 oz (62.1 kg)  12/19/17 138 lb (62.6 kg)   BP Readings from Last 3 Encounters:  02/18/18 104/75  01/08/18 102/68  12/19/17 135/82   Pulse Readings from Last 3 Encounters:  02/18/18 73  01/08/18 86  12/19/17 (!) 106   BMI Readings from Last 3 Encounters:  02/18/18 22.08 kg/m  01/08/18 21.43 kg/m  12/19/17 21.78 kg/m         Patient Care Team    Relationship Specialty Notifications Start End  Thomasene Lotpalski, Tocarra Gassen, DO PCP - General Family Medicine  02/15/16   Tyrell AntonioNewton, Frederic, MD Consulting Physician Physical Medicine and Rehabilitation  05/22/16   York SpanielWillis, Charles K, MD Consulting Physician  Neurology  05/22/16   Specialists, [facility]  Orthopedic Surgery  12/11/16   Suzanna ObeyByers, John, MD Consulting Physician Otolaryngology  10/04/17   Iva BoopGessner, Carl E, MD Consulting Physician Gastroenterology  10/04/17   Drema HalonNewman, Christopher E, MD Consulting Physician Otolaryngology  10/04/17   Ihor Gullyttelin, Mark, MD Consulting Physician Urology  10/04/17      Patient Active Problem List   Diagnosis Date Noted  . Insomnia 11/26/2017    Priority: High  . Mood disorder (HCC) 11/26/2017    Priority: High  . Statin intolerance 12/13/2016    Priority: High  . Restless legs syndrome (RLS) 02/15/2016    Priority: High  . h/o CLinically Signficant Anxiety 01/05/2014    Priority: High  . h/o Clinical depression 12/31/2013    Priority: High  . Hyperlipidemia 12/18/2013    Priority: High  . 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 Elevated TSH 06/21/2016    Priority: Medium  . Vitamin D insufficiency 06/21/2016    Priority: Low  . Chronic pain 08/11/2013    Priority: Low  . Abnormality of gait 07/09/2012    Priority: Low  . Habitual snoring 11/26/2017  . Chronic pansinusitis 09/25/2017  . Deviated septum 09/05/2017  . Hypertrophy, nasal, turbinate 09/05/2017  . Perennial allergic rhinitis 09/05/2017  . Impingement syndrome of left shoulder 04/29/2017  . Chronic left shoulder pain 04/22/2017  . Strain of right hip adductor muscle 02/05/2017  . External hemorrhoids 05/18/2016  . Benign prostatic hyperplasia  L>R; without lower urinary tract symptoms 05/18/2016  . Health education/counseling 02/25/2016  . Erectile dysfunction 02/15/2016  . Genital Herpes 11/18/2013  . Cannot sleep 11/18/2013  . Extremity pain 11/18/2013  . Abnormal findings on diagnostic imaging of abdomen 10/29/2013  . Polypharmacy 08/18/2013  . Other long term (current) drug therapy 08/18/2013  . Other specified disorders of urinary tract 07/09/2012  . Lumbago 07/09/2012  . Disturbance of  skin sensation 07/09/2012  . 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    Current Meds  Medication Sig  . acyclovir (ZOVIRAX) 800 MG tablet TAKE 1 TABLET (800 MG TOTAL) BY MOUTH DAILY.  . baclofen (LIORESAL) 10 MG tablet Take 1 tablet (10 mg total) by mouth at bedtime.  . baclofen (LIORESAL) 10 MG tablet Take 1 tablet (10 mg total) by mouth at bedtime.  . Cholecalciferol (VITAMIN D3) 5000 units TABS 5,000 IU OTC  vitamin D3 daily.  . DULoxetine (CYMBALTA) 20 MG capsule Take 1 capsule (20 mg total) by mouth 2 (two) times daily.  Marland Kitchen omega-3 acid ethyl esters (LOVAZA) 1 g capsule Take 2 capsules (2 g total) by mouth 2 (two) times daily.  Marland Kitchen rOPINIRole (REQUIP) 2 MG tablet TAKE 1/2 TABLET BY MOUTH DAILY.  . traZODone (DESYREL) 100 MG tablet 0.5-1 tablet p.o. nightly as needed sleep  . Turmeric Curcumin 500 MG CAPS Take 1 capsule by mouth daily.  . [DISCONTINUED] DULoxetine (CYMBALTA) 20 MG capsule Take 1 capsule (20 mg total) by mouth daily. for 1 week then 1 capsule every 12 hrs.  . [DISCONTINUED] traZODone (DESYREL) 100 MG tablet 0.5-1 tablet p.o. nightly as needed sleep   Current Facility-Administered Medications for the 02/18/18 encounter (Office Visit) with [name], DO  Medication  . 0.9 %  sodium chloride infusion  . betamethasone acetate-betamethasone sodium phosphate (CELESTONE) injection 12 mg    Allergies:  Allergies  Allergen Reactions  . Prozac [Fluoxetine Hcl]     tolerate well due to SE -felt sluggish, tired, and anxious   . Pregabalin Other (See Comments)    Intolerance, somnolence  . Statins Other (See Comments)    Other reaction(s): Muscle Pain Other reaction(s): Muscle Pain Other reaction(s): Muscle Pain      Review of Systems: Review of Systems: General:   No F/C, wt  loss Pulm:   No DIB, SOB, pleuritic chest pain Card:  No CP, palpitations Abd:  No n/v/d or pain Ext:  No inc edema from baseline Psych: no SI/ HI    Objective:   Blood pressure 104/75, pulse 73, height 5\' 7"  (1.702 m), weight 141 lb (64 kg), SpO2 98 %. Body mass index is 22.08 kg/m. General:  Well Developed, well nourished, appropriate for stated age.  Neuro:  Alert and oriented,  extra-ocular muscles intact  HEENT:  Normocephalic, atraumatic, neck supple, no carotid bruits appreciated  Skin:  no gross rash, warm, pink. Cardiac:  RRR, S1 S2 Respiratory:  ECTA B/L and A/P, Not using accessory muscles, speaking in full sentences- unlabored. Vascular:  Ext warm, no cyanosis apprec.; cap RF less 2 sec. Psych:  No HI/SI, judgement and insight good, Euthymic mood. Full Affect.

## 2018-03-05 ENCOUNTER — Encounter: Payer: Self-pay | Admitting: Neurology

## 2018-03-05 ENCOUNTER — Ambulatory Visit (INDEPENDENT_AMBULATORY_CARE_PROVIDER_SITE_OTHER): Payer: No Typology Code available for payment source | Admitting: Neurology

## 2018-03-05 VITALS — BP 95/60 | HR 85 | Ht 67.0 in | Wt 137.5 lb

## 2018-03-05 DIAGNOSIS — G114 Hereditary spastic paraplegia: Secondary | ICD-10-CM | POA: Diagnosis not present

## 2018-03-05 MED ORDER — BACLOFEN 10 MG PO TABS: 15.0000 mg | ORAL_TABLET | Freq: Every day | ORAL | 3 refills | Status: DC

## 2018-03-05 NOTE — Patient Instructions (Signed)
We will increase the baclofen to 15 mg at night.

## 2018-03-05 NOTE — Progress Notes (Signed)
Reason for visit: Hereditary spastic paraparesis  [name] is an 62 y.o. male  History of present illness:  [name] is a 62 year old right-handed white male with a history of hereditary spastic paraparesis.  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  The patient has to drag his feet, he wears out his shoes rapidly doing this.  AFO braces previously were not helpful.  The patient may use a cane or a walker.  He uses trazodone at night for sleep which offers some benefit.  The patient is on baclofen at nighttime taking 10 mg which offers some benefit but may not be enough.  He does have his legs kick out at night at times.  He returns to this office for an evaluation.  Past Medical History:  Diagnosis Date  . Arthritis   . Chronic insomnia   . Chronic low back pain    Annular tear at the L2-3 and L4-5 levels 4  . Depression   . Gait disorder   . Hereditary spastic paraparesis (HCC)   . Hyperlipidemia   . 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 Neurogenic bladder   . RLS (restless legs syndrome)     Past Surgical History:  Procedure Laterality Date  . APPENDECTOMY    . BREAST SURGERY Bilateral    cysts  . COLONOSCOPY    . KNEE ARTHROSCOPY     right  . SHOULDER SURGERY     Rotator cuff, left  . ULNAR TUNNEL RELEASE Left 09/10/2014   Procedure: LEFT CUBITAL TUNNEL RELEASE;  Surgeon: Cheral AlmasNaiping Michael Xu, MD;  Location: [facility];  Service: Orthopedics;  Laterality: Left;    Family History  Problem Relation Age of Onset  . Cancer Mother        breast cancer  . Other Mother   . Hyperlipidemia Mother   . Stroke Father        Intracranial hemorrhage  . Other Brother   . Other Brother   . Other Brother   . Colon cancer Neg Hx   . Colon polyps Neg Hx   . Esophageal cancer Neg Hx   . Stomach cancer Neg Hx   . Rectal cancer Neg Hx     Social history:  reports that he has never smoked. He has  never used smokeless tobacco. He reports that he drinks about 4.2 oz of alcohol per week. He reports that he does not use drugs.    Allergies  Allergen Reactions  . Prozac [Fluoxetine Hcl]     tolerate well due to SE -felt sluggish, tired, and anxious   . Pregabalin Other (See Comments)    Intolerance, somnolence  . Statins Other (See Comments)    Other reaction(s): Muscle Pain Other reaction(s): Muscle Pain Other reaction(s): Muscle Pain     Medications:  Prior to Admission medications   Medication Sig Start Date End Date Taking? Authorizing Provider  acyclovir (ZOVIRAX) 800 MG tablet TAKE 1 TABLET (800 MG TOTAL) BY MOUTH DAILY. 02/12/18  Yes Opalski, Gavin Poundeborah, DO  baclofen (LIORESAL) 10 MG tablet Take 1.5 tablets (15 mg total) by mouth at bedtime. 03/05/18  Yes York SpanielWillis, Nyjae Hodge K, MD  Cholecalciferol (VITAMIN D3) 5000 units TABS 5,000 IU OTC vitamin D3 daily. 06/21/16  Yes Opalski, Gavin Poundeborah, DO  DULoxetine (CYMBALTA) 20 MG capsule Take 1 capsule (20 mg total) by mouth 2 (two) times daily. 02/18/18  Yes Opalski, Gavin Poundeborah,  DO  omega-3 acid ethyl esters (LOVAZA) 1 g capsule Take 2 capsules (2 g total) by mouth 2 (two) times daily. 10/04/17  Yes Opalski, Deborah, DO  rOPINIRole (REQUIP) 2 MG tablet TAKE 1/2 TABLET BY MOUTH DAILY. 12/23/17  Yes Opalski, Gavin Pound, DO  traZODone (DESYREL) 100 MG tablet 0.5-1 tablet p.o. nightly as needed sleep 02/18/18  Yes Opalski, Deborah, DO  Turmeric Curcumin 500 MG CAPS Take 1 capsule by mouth daily.   Yes [provider]    ROS:  Out of a complete 14 system review of symptoms, the patient complains only of the following symptoms, and all other reviewed systems are negative.  Weight loss Insomnia, frequent waking, snoring Muscle cramps, walking difficulty  Blood pressure 95/60, pulse 85, height 5\' 7"  (1.702 m), weight 137 lb 8 oz (62.4 kg), SpO2 96 %.  Physical Exam  General: The patient is alert and cooperative at the time of the  examination.  Skin: No significant peripheral edema is noted.   Neurologic Exam  Mental status: The patient is alert and oriented x 3 at the time of the examination. The patient has apparent normal recent and remote memory, with an apparently normal attention span and concentration ability.   Cranial nerves: Facial symmetry is present. Speech is normal, no aphasia or dysarthria is noted. Extraocular movements are full. Visual fields are full.  Motor: The patient has good strength in all 4 extremities, with exception of 4-/5 strength with hip flexion bilaterally, bilateral foot drops are noted, 4/5 strength with extension of the knees and flexion of the knees is noted.  Good arm strength is seen.  Sensory examination: Soft touch sensation is symmetric on the face, arms, and legs.  Coordination: The patient has good finger-nose-finger and heel-to-shin bilaterally.  Gait and station: The patient has a slightly wide-based, diplegic gait.  The patient walks with a walker.  Reflexes: Deep tendon reflexes are symmetric.   Assessment/Plan:  1.  Hereditary spastic paraparesis  The patient continues to have some decline in his ability to ambulate.  The patient will be given baclofen 15 mg at night, a prescription was sent in.  He will follow-up in 9 months.  Marlan Palau MD 03/05/2018 10:33 AM  [facility] [address] [place], [place] [address]  Phone [phone] Fax [phone]

## 2018-03-21 MED FILL — rOPINIRole HCL 2 MG TABS: 2 | 90 days supply | Qty: 45 | Fill #1

## 2018-04-08 ENCOUNTER — Ambulatory Visit: Payer: No Typology Code available for payment source | Admitting: Family Medicine

## 2018-04-10 ENCOUNTER — Other Ambulatory Visit: Payer: Self-pay | Admitting: Neurology

## 2018-04-10 MED ORDER — BACLOFEN 10 MG PO TABS: 20.0000 mg | ORAL_TABLET | Freq: Every day | ORAL | 3 refills | Status: DC

## 2018-04-10 MED FILL — BACLOFEN 10 MG TABLET: 10 | 90 days supply | Qty: 180 | Fill #0

## 2018-04-10 NOTE — Telephone Encounter (Signed)
I called Terry Howard back at the pharmacy. She states she relayed directions that he should be taking 1.5 tabs (15mg  total) qhs but patient said he tried this for a week and it was ineffective. He told them Dr. Anne Hahn said it would be okay for him to take 1.5-2 tabs qhs. He has been taking 2 tablets for the past month (20mg  total) qhs. Advised Dr. Anne Hahn last note does not reflect this. I will have to clarify with him. She would like a call back to let them know how to proceed.

## 2018-04-10 NOTE — Telephone Encounter (Signed)
[email or web address] - [place], [place] - [address] [phone] has called re: pt's baclofen (LIORESAL) 10 MG tablet She states pt is saying the instructions are to be for more that what she has it to be.  Please call

## 2018-04-10 NOTE — Telephone Encounter (Signed)
Okay for the patient take 20 mg of baclofen at night, I will send a prescription to accommodate this.

## 2018-04-10 NOTE — Addendum Note (Signed)
Addended by: York Spaniel on: 04/10/2018 04:59 PM   Modules accepted: Orders

## 2018-04-11 ENCOUNTER — Ambulatory Visit: Payer: No Typology Code available for payment source | Admitting: Family Medicine

## 2018-04-11 NOTE — Telephone Encounter (Signed)
I called Terry Howard to let her know Dr. Anne Hahn called in new rx for baclofen 10mg  (2 tabs at bed). She verbalized understanding and received rx yesterday. She voided old rx for baclofen 10mg  (1.5 tabs at night). Nothing further needed.

## 2018-04-21 ENCOUNTER — Encounter: Payer: Self-pay | Admitting: Family Medicine

## 2018-04-21 ENCOUNTER — Ambulatory Visit (INDEPENDENT_AMBULATORY_CARE_PROVIDER_SITE_OTHER): Payer: No Typology Code available for payment source | Admitting: Family Medicine

## 2018-04-21 VITALS — BP 127/81 | HR 67 | Ht 67.0 in | Wt 142.9 lb

## 2018-04-21 DIAGNOSIS — Z5329 Procedure and treatment not carried out because of patient's decision for other reasons: Secondary | ICD-10-CM

## 2018-05-19 MED FILL — traZODone HCL 100 MG TABS: 100 | 90 days supply | Qty: 90 | Fill #1

## 2018-05-19 MED FILL — ACYCLOVIR 800 MG TABLET: 800 | 90 days supply | Qty: 90 | Fill #1

## 2018-06-06 ENCOUNTER — Encounter: Payer: Self-pay | Admitting: Family Medicine

## 2018-06-06 ENCOUNTER — Ambulatory Visit (INDEPENDENT_AMBULATORY_CARE_PROVIDER_SITE_OTHER): Payer: No Typology Code available for payment source | Admitting: Family Medicine

## 2018-06-06 VITALS — BP 123/80 | HR 60 | Temp 97.8°F | Ht 67.0 in | Wt 145.5 lb

## 2018-06-06 DIAGNOSIS — G114 Hereditary spastic paraplegia: Secondary | ICD-10-CM

## 2018-06-06 DIAGNOSIS — G4700 Insomnia, unspecified: Secondary | ICD-10-CM | POA: Diagnosis not present

## 2018-06-06 DIAGNOSIS — R262 Difficulty in walking, not elsewhere classified: Secondary | ICD-10-CM

## 2018-06-06 DIAGNOSIS — R29818 Other symptoms and signs involving the nervous system: Secondary | ICD-10-CM

## 2018-06-06 DIAGNOSIS — K0510 Chronic gingivitis, plaque induced: Secondary | ICD-10-CM | POA: Insufficient documentation

## 2018-06-06 DIAGNOSIS — Z789 Other specified health status: Secondary | ICD-10-CM

## 2018-06-06 DIAGNOSIS — F39 Unspecified mood [affective] disorder: Secondary | ICD-10-CM | POA: Diagnosis not present

## 2018-06-06 DIAGNOSIS — E785 Hyperlipidemia, unspecified: Secondary | ICD-10-CM | POA: Diagnosis not present

## 2018-06-06 DIAGNOSIS — K061 Gingival enlargement: Secondary | ICD-10-CM
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
## 2018-06-06 NOTE — Patient Instructions (Signed)
Guidelines for a Low Cholesterol, Low Saturated Fat Diet   Fats - Limit total intake of fats and oils. - Avoid butter, stick margarine, shortening, lard, palm and coconut oils. - Limit mayonnaise, salad dressings, gravies and sauces, unless they are homemade with low-fat ingredients. - Limit chocolate. - Choose low-fat and nonfat products, such as low-fat mayonnaise, low-fat or non-hydrogenated peanut butter, low-fat or fat-free salad dressings and nonfat gravy. - Use vegetable oil, such as canola or olive oil. - Look for margarine that does not contain trans fatty acids. - Use nuts in moderate amounts. - Read ingredient labels carefully to determine both amount and type of fat present in foods. Limit saturated and trans fats! - Avoid high-fat processed and convenience foods.  Meats and Meat Alternatives - Choose fish, chicken, [place] and lean meats. - Use dried beans, peas, lentils and tofu. - Limit egg yolks to three to four per week. - If you eat red meat, limit to no more than three servings per week and choose loin or round cuts. - Avoid fatty meats, such as bacon, sausage, franks, luncheon meats and ribs. - Avoid all organ meats, including liver.  Dairy - Choose nonfat or low-fat milk, yogurt and cottage cheese. - Most cheeses are high in fat. Choose cheeses made from non-fat milk, such as mozzarella and ricotta cheese. - Choose light or fat-free cream cheese and sour cream. - Avoid cream and sauces made with cream.  Fruits and Vegetables - Eat a wide variety of fruits and vegetables. - Use lemon juice, vinegar or "mist" olive oil on vegetables. - Avoid adding sauces, fat or oil to vegetables.  Breads, Cereals and Grains - Choose whole-grain breads, cereals, pastas and rice. - Avoid high-fat snack foods, such as granola, cookies, pies, pastries, doughnuts and croissants.  Cooking Tips - Avoid deep fried foods. - Trim visible fat off meats and remove skin from poultry  before cooking. - Bake, broil, boil, poach or roast poultry, fish and lean meats. - Drain and discard fat that drains out of meat as you cook it. - Add little or no fat to foods. - Use vegetable oil sprays to grease pans for cooking or baking. - Steam vegetables. - Use herbs or no-oil marinades to flavor foods.    Living With Depression Everyone experiences occasional disappointment, sadness, and loss in their lives. When you are feeling down, blue, or sad for at least 2 weeks in a row, it may mean that you have depression. Depression can affect your thoughts and feelings, relationships, daily activities, and physical health. It is caused by changes in the way your brain functions. If you receive a diagnosis of depression, your health care provider will tell you which type of depression you have and what treatment options are available to you. If you are living with depression, there are ways to help you recover from it and also ways to prevent it from coming back. How to cope with lifestyle changes Coping with stress Stress is your body's reaction to life changes and events, both good and bad. Stressful situations may include:  Getting married.  The death of a spouse.  Losing a job.  Retiring.  Having a baby.  Stress can last just a few hours or it can be ongoing. Stress can play a major role in depression, so it is important to learn both how to cope with stress and how to think about it differently. Talk with your health care provider or a counselor if  you would like to learn more about stress reduction. He or she may suggest some stress reduction techniques, such as:  Music therapy. This can include creating music or listening to music. Choose music that you enjoy and that inspires you.  Mindfulness-based meditation. This kind of meditation can be done while sitting or walking. It involves being aware of your normal breaths, rather than trying to control your  breathing.  Centering prayer. This is a kind of meditation that involves focusing on a spiritual word or phrase. Choose a word, phrase, or sacred image that is meaningful to you and that brings you peace.  Deep breathing. To do this, expand your stomach and inhale slowly through your nose. Hold your breath for 3-5 seconds, then exhale slowly, allowing your stomach muscles to relax.  Muscle relaxation. This involves intentionally tensing muscles then relaxing them.  Choose a stress reduction technique that fits your lifestyle and personality. Stress reduction techniques take time and practice to develop. Set aside 5-15 minutes a day to do them. Therapists can offer training in these techniques. The training may be covered by some insurance plans. Other things you can do to manage stress include:  Keeping a stress diary. This can help you learn what triggers your stress and ways to control your response.  Understanding what your limits are and saying no to requests or events that lead to a schedule that is too full.  Thinking about how you respond to certain situations. You may not be able to control everything, but you can control how you react.  Adding humor to your life by watching funny films or TV shows.  Making time for activities that help you relax and not feeling guilty about spending your time this way.  Medicines Your health care provider may suggest certain medicines if he or she feels that they will help improve your condition. Avoid using alcohol and other substances that may prevent your medicines from working properly (may interact). It is also important to:  Talk with your pharmacist or health care provider about all the medicines that you take, their possible side effects, and what medicines are safe to take together.  Make it your goal to take part in all treatment decisions (shared decision-making). This includes giving input on the side effects of medicines. It is best if  shared decision-making with your health care provider is part of your total treatment plan.  If your health care provider prescribes a medicine, you may not notice the full benefits of it for 4-8 weeks. Most people who are treated for depression need to be on medicine for at least 6-12 months after they feel better. If you are taking medicines as part of your treatment, do not stop taking medicines without first talking to your health care provider. You may need to have the medicine slowly decreased (tapered) over time to decrease the risk of harmful side effects. Relationships Your health care provider may suggest family therapy along with individual therapy and drug therapy. While there may not be family problems that are causing you to feel depressed, it is still important to make sure your family learns as much as they can about your mental health. Having your family's support can help make your treatment successful. How to recognize changes in your condition Everyone has a different response to treatment for depression. Recovery from major depression happens when you have not had signs of major depression for two months. This may mean that you will start to:  Have more interest in doing activities.  Feel less hopeless than you did 2 months ago.  Have more energy.  Overeat less often, or have better or improving appetite.  Have better concentration.  Your health care provider will work with you to decide the next steps in your recovery. It is also important to recognize when your condition is getting worse. Watch for these signs:  Having fatigue or low energy.  Eating too much or too little.  Sleeping too much or too little.  Feeling restless, agitated, or hopeless.  Having trouble concentrating or making decisions.  Having unexplained physical complaints.  Feeling irritable, angry, or aggressive.  Get help as soon as you or your family members notice these symptoms coming  back. How to get support and help from others How to talk with friends and family members about your condition Talking to friends and family members about your condition can provide you with one way to get support and guidance. Reach out to trusted friends or family members, explain your symptoms to them, and let them know that you are working with a health care provider to treat your depression. Financial resources Not all insurance plans cover mental health care, so it is important to check with your insurance carrier. If paying for co-pays or counseling services is a problem, search for a local or county mental health care center. They may be able to offer public mental health care services at low or no cost when you are not able to see a private health care provider. If you are taking medicine for depression, you may be able to get the generic form, which may be less expensive. Some makers of prescription medicines also offer help to patients who cannot afford the medicines they need. Follow these instructions at home:  Get the right amount and quality of sleep.  Cut down on using caffeine, tobacco, alcohol, and other potentially harmful substances.  Try to exercise, such as walking or lifting small weights.  Take over-the-counter and prescription medicines only as told by your health care provider.  Eat a healthy diet that includes plenty of vegetables, fruits, whole grains, low-fat dairy products, and lean protein. Do not eat a lot of foods that are high in solid fats, added sugars, or salt.  Keep all follow-up visits as told by your health care provider. This is important. Contact a health care provider if:  You stop taking your antidepressant medicines, and you have any of these symptoms: ? Nausea. ? Headache. ? Feeling lightheaded. ? Chills and body aches. ? Not being able to sleep (insomnia).  You or your friends and family think your depression is getting worse. Get help  right away if:  You have thoughts of hurting yourself or others. If you ever feel like you may hurt yourself or others, or have thoughts about taking your own life, get help right away. You can go to your nearest emergency department or call:  Your local emergency services (911 in the [place]  A suicide crisis helpline, such as the National Suicide Prevention Lifeline at (343)726-6495. This is open 24-hours a day.  Summary  If you are living with depression, there are ways to help you recover from it and also ways to prevent it from coming back.  Work with your health care team to create a management plan that includes counseling, stress management techniques, and healthy lifestyle habits. This information is not intended to replace advice given to you by your health care provider.  Make sure you discuss any questions you have with your health care provider. Document Released: 06/25/2016 Document Revised: 06/25/2016 Document Reviewed: 06/25/2016 Elsevier Interactive Patient Education  [employer].

## 2018-06-18 MED FILL — rOPINIRole HCL 2 MG TABS: 2 | 90 days supply | Qty: 45 | Fill #2

## 2018-07-11 MED FILL — BACLOFEN 10 MG TABLET: 10 | 90 days supply | Qty: 180 | Fill #1

## 2018-07-19 ENCOUNTER — Telehealth: Payer: No Typology Code available for payment source | Admitting: Nurse Practitioner

## 2018-07-19 DIAGNOSIS — J069 Acute upper respiratory infection, unspecified: Secondary | ICD-10-CM

## 2018-07-19 MED ORDER — AZITHROMYCIN 250 MG PO TABS: ORAL_TABLET | ORAL | 0 refills | Status: DC

## 2018-07-19 NOTE — Progress Notes (Signed)
We are sorry that you are not feeling well.  Here is how we plan to help!  Based on your presentation I believe you most likely have A cough due to bacteria.  When patients have a fever and a productive cough with a change in color or increased sputum production, we are concerned about bacterial bronchitis.  If left untreated it can progress to pneumonia.  If your symptoms do not improve with your treatment plan it is important that you contact your provider.   I have prescribed Azithromyin 250 mg: two tablets now and then one tablet daily for 4 additonal days    In addition you may use A non-prescription cough medication called Mucinex DM: take 2 tablets every 12 hours.  *I chose to treat with antibiotic due to length of illness and symptoms, of fever and worsening cough.  From your responses in the eVisit questionnaire you describe inflammation in the upper respiratory tract which is causing a significant cough.  This is commonly called Bronchitis and has four common causes:    Allergies  Viral Infections  Acid Reflux  Bacterial Infection Allergies, viruses and acid reflux are treated by controlling symptoms or eliminating the cause. An example might be a cough caused by taking certain blood pressure medications. You stop the cough by changing the medication. Another example might be a cough caused by acid reflux. Controlling the reflux helps control the cough.  USE OF BRONCHODILATOR ("RESCUE") INHALERS: There is a risk from using your bronchodilator too frequently.  The risk is that over-reliance on a medication which only relaxes the muscles surrounding the breathing tubes can reduce the effectiveness of medications prescribed to reduce swelling and congestion of the tubes themselves.  Although you feel brief relief from the bronchodilator inhaler, your asthma may actually be worsening with the tubes becoming more swollen and filled with mucus.  This can delay other crucial treatments, such  as oral steroid medications. If you need to use a bronchodilator inhaler daily, several times per day, you should discuss this with your provider.  There are probably better treatments that could be used to keep your asthma under control.     HOME CARE . Only take medications as instructed by your medical team. . Complete the entire course of an antibiotic. . Drink plenty of fluids and get plenty of rest. . Avoid close contacts especially the very young and the elderly . Cover your mouth if you cough or cough into your sleeve. . Always remember to wash your hands . A steam or ultrasonic humidifier can help congestion.   GET HELP RIGHT AWAY IF: . You develop worsening fever. . You become short of breath . You cough up blood. . Your symptoms persist after you have completed your treatment plan MAKE SURE YOU   Understand these instructions.  Will watch your condition.  Will get help right away if you are not doing well or get worse.  Your e-visit answers were reviewed by a board certified advanced clinical practitioner to complete your personal care plan.  Depending on the condition, your plan could have included both over the counter or prescription medications. If there is a problem please reply  once you have received a response from your provider. Your safety is important to us.  If you have drug allergies check your prescription carefully.    You can use MyChart to ask questions about today's visit, request a non-urgent call back, or ask for a work or school excuse for  24 hours related to this e-Visit. If it has been greater than 24 hours you will need to follow up with your provider, or enter a new e-Visit to address those concerns. You will get an e-mail in the next two days asking about your experience.  I hope that your e-visit has been valuable and will speed your recovery. Thank you for using e-visits.

## 2018-07-22 MED FILL — DULoxetine HCL 20 MG CPEP: 20 | 90 days supply | Qty: 180 | Fill #1

## 2018-08-01 ENCOUNTER — Telehealth: Payer: No Typology Code available for payment source | Admitting: Family

## 2018-08-01 DIAGNOSIS — R112 Nausea with vomiting, unspecified: Secondary | ICD-10-CM

## 2018-08-01 NOTE — Progress Notes (Signed)
Based on what you shared with me it looks like you have a condition that should be evaluated in a face to face office visit.  NOTE: If you entered your credit card information for this eVisit, you will not be charged. You may see a "hold" on your card for the $30 but that hold will drop off and you will not have a charge processed.  If you are having a true medical emergency please call 911.  If you need an urgent face to face visit, [facility] has four urgent care centers for your convenience.  If you need care fast and have a high deductible or no insurance consider:   [email or web address] to reserve your spot online an avoid wait times  [facility] [address], [address] [place], [place] [address] 8 am to 8 pm Monday-Friday 10 am to 4 pm Saturday-Sunday *Across the street from [employer]  [facility]  [address] [place], [address] 8 am to 5 pm Monday-Friday * In the [facility]   The following sites will take your  insurance:  . [facility]  [phone] Get Driving Directions Find a Provider at this Location  [address] [place], [place] [address] . 10 am to 8 pm Monday-Friday . 12 pm to 8 pm Saturday-Sunday   . [facility]  [phone] Get Driving Directions Find a Provider at this Location  [address], [address] [place], [place] [address] . 8 am to 8 pm Monday-Friday . 9 am to 6 pm Saturday . 11 am to 6 pm Sunday   . [facility] [place]  [phone] Get Driving Directions  [address] [place], [place] [address] . 8 am to 8 pm Monday-Friday . 8 am to 4 pm Saturday-Sunday   Your e-visit answers were reviewed by a board certified advanced clinical practitioner to complete your personal care plan.  Thank you for using e-Visits. 

## 2018-08-21 ENCOUNTER — Ambulatory Visit (INDEPENDENT_AMBULATORY_CARE_PROVIDER_SITE_OTHER): Payer: No Typology Code available for payment source

## 2018-08-21 VITALS — BP 120/78 | HR 76 | Temp 97.7°F

## 2018-08-21 DIAGNOSIS — Z23 Encounter for immunization: Secondary | ICD-10-CM

## 2018-08-21 NOTE — Progress Notes (Signed)
Patient here for flu vaccine, patient tolerated injection well. MPulliam, CMA/RT(R)

## 2018-09-03 ENCOUNTER — Other Ambulatory Visit: Payer: Self-pay | Admitting: Family Medicine

## 2018-09-03 MED FILL — ACYCLOVIR 800 MG TABLET: 800 | 90 days supply | Qty: 90 | Fill #0

## 2018-09-03 MED FILL — traZODone HCL 100 MG TABS: 100 | 90 days supply | Qty: 90 | Fill #2

## 2018-09-11 ENCOUNTER — Encounter: Payer: Self-pay | Admitting: Neurology

## 2018-09-11 ENCOUNTER — Ambulatory Visit: Payer: No Typology Code available for payment source | Admitting: Neurology

## 2018-09-11 VITALS — BP 111/67 | HR 81 | Ht 67.0 in | Wt 145.3 lb

## 2018-09-11 DIAGNOSIS — G114 Hereditary spastic paraplegia: Secondary | ICD-10-CM

## 2018-09-11 NOTE — Progress Notes (Signed)
Reason for visit: Hereditary spastic paraparesis  Elder CyphersSteven M Hardeman is an 63 y.o. male  History of present illness:  [name] is a 63 year old right-handed white male with a history of a hereditary spastic paraparesis.  The patient has 2 older brothers who also have this condition, both of them are currently in wheelchairs.  The patient is having increasing difficulty with walking, he tries to stay active as much as possible, he will fall on occasion.  The patient is on oral baclofen, he still has a lot of stiffness in the legs, and spasms at nighttime with the lower extremities.  The patient reports no discomfort.  He returns to this office for an evaluation.  Past Medical History:  Diagnosis Date  . Arthritis   . Chronic insomnia   . Chronic low back pain    Annular tear at the L2-3 and L4-5 levels 4  . Depression   . Gait disorder   . Hereditary spastic paraparesis (HCC)   . Hyperlipidemia   . 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 Neurogenic bladder   . RLS (restless legs syndrome)     Past Surgical History:  Procedure Laterality Date  . APPENDECTOMY    . BREAST SURGERY Bilateral    cysts  . COLONOSCOPY    . KNEE ARTHROSCOPY     right  . SHOULDER SURGERY     Rotator cuff, left  . ULNAR TUNNEL RELEASE Left 09/10/2014   Procedure: LEFT CUBITAL TUNNEL RELEASE;  Surgeon: Cheral AlmasNaiping Michael Xu, MD;  Location: [facility];  Service: Orthopedics;  Laterality: Left;    Family History  Problem Relation Age of Onset  . Cancer Mother        breast cancer  . Other Mother   . Hyperlipidemia Mother   . Stroke Father        Intracranial hemorrhage  . Other Brother   . Other Brother   . Other Brother   . Colon cancer Neg Hx   . Colon polyps Neg Hx   . Esophageal cancer Neg Hx   . Stomach cancer Neg Hx   . Rectal cancer Neg Hx     Social history:  reports that he has never smoked. He has never used smokeless tobacco. He reports current alcohol use of  about 7.0 standard drinks of alcohol per week. He reports that he does not use drugs.    Allergies  Allergen Reactions  . Prozac [Fluoxetine Hcl]     tolerate well due to SE -felt sluggish, tired, and anxious   . Pregabalin Other (See Comments)    Intolerance, somnolence  . Statins Other (See Comments)    Other reaction(s): Muscle Pain Other reaction(s): Muscle Pain Other reaction(s): Muscle Pain     Medications:  Prior to Admission medications   Medication Sig Start Date End Date Taking? Authorizing Provider  acyclovir (ZOVIRAX) 800 MG tablet TAKE 1 TABLET BY MOUTH DAILY. 09/03/18  Yes Opalski, Gavin Poundeborah, DO  baclofen (LIORESAL) 10 MG tablet Take 2 tablets (20 mg total) by mouth at bedtime. 04/10/18  Yes York SpanielWillis, Kenslie Abbruzzese K, MD  Cholecalciferol (VITAMIN D3) 5000 units TABS 5,000 IU OTC vitamin D3 daily. 06/21/16  Yes Opalski, Deborah, DO  omega-3 acid ethyl esters (LOVAZA) 1 g capsule Take 2 capsules (2 g total) by mouth 2 (two) times daily. 10/04/17  Yes Opalski, Deborah, DO  rOPINIRole (REQUIP) 2 MG tablet TAKE 1/2 TABLET BY MOUTH DAILY. 12/23/17  Yes Opalski, Gavin Poundeborah,  DO  traZODone (DESYREL) 100 MG tablet 0.5-1 tablet p.o. nightly as needed sleep 02/18/18  Yes Opalski, Deborah, DO  Turmeric Curcumin 500 MG CAPS Take 1 capsule by mouth daily.   Yes [provider]    ROS:  Out of a complete 14 system review of symptoms, the patient complains only of the following symptoms, and all other reviewed systems are negative.  Fatigue Incontinence of the bladder, frequency of urination Restless legs, insomnia, snoring Joint pain, back pain  Blood pressure 111/67, pulse 81, height 5\' 7"  (1.702 m), weight 145 lb 5 oz (65.9 kg).  Physical Exam  General: The patient is alert and cooperative at the time of the examination.  Skin: No significant peripheral edema is noted.   Neurologic Exam  Mental status: The patient is alert and oriented x 3 at the time of the examination. The  patient has apparent normal recent and remote memory, with an apparently normal attention span and concentration ability.   Cranial nerves: Facial symmetry is present. Speech is normal, no aphasia or dysarthria is noted. Extraocular movements are full. Visual fields are full.  Motor: The patient has good strength in all 4 extremities, increased motor tone is noted in both legs.  Sensory examination: Soft touch sensation is symmetric on the face, arms, and legs.  Coordination: The patient has good finger-nose-finger bilaterally.  The patient has some difficulty with heel-to-shin bilaterally.  Gait and station: The patient has a spastic, diplegic gait.  He is able to walk slowly with a cane.  Reflexes: Deep tendon reflexes are symmetric.   Assessment/Plan:  1.  Hereditary spastic paraparesis  2.  Gait disorder  The patient will be sent for a baclofen challenge to determine if a baclofen pump may be possible.  The patient will continue his oral baclofen dosing, he will follow-up here in 6 months.  Marlan Palau. Keith Junnie Loschiavo MD 09/11/2018 10:39 AM  [facility] [address] [place], [place] [address]  Phone [phone] Fax [phone]

## 2018-09-16 MED FILL — rOPINIRole HCL 2 MG TABS: 2 | 90 days supply | Qty: 45 | Fill #3 | Status: TO

## 2018-10-07 NOTE — Progress Notes (Signed)
Error - no show

## 2018-10-20 MED FILL — BACLOFEN 10 MG TABS: 10 | 90 days supply | Qty: 180 | Fill #2

## 2018-10-31 ENCOUNTER — Other Ambulatory Visit: Payer: Self-pay | Admitting: Family Medicine

## 2018-10-31 MED FILL — ACYCLOVIR 800 MG TABLET: 800 | 90 days supply | Qty: 90 | Fill #0 | Status: TO

## 2018-11-05 ENCOUNTER — Telehealth (INDEPENDENT_AMBULATORY_CARE_PROVIDER_SITE_OTHER): Payer: Self-pay | Admitting: Orthopaedic Surgery

## 2018-11-05 NOTE — Telephone Encounter (Signed)
Patient called to make an appointment and answered "NO" to all prescreening questions.  Thank you.

## 2018-11-06 ENCOUNTER — Other Ambulatory Visit: Payer: Self-pay

## 2018-11-06 ENCOUNTER — Encounter (INDEPENDENT_AMBULATORY_CARE_PROVIDER_SITE_OTHER): Payer: Self-pay | Admitting: Orthopaedic Surgery

## 2018-11-06 ENCOUNTER — Ambulatory Visit (INDEPENDENT_AMBULATORY_CARE_PROVIDER_SITE_OTHER): Payer: No Typology Code available for payment source | Admitting: Orthopaedic Surgery

## 2018-11-06 DIAGNOSIS — G5622 Lesion of ulnar nerve, left upper limb: Secondary | ICD-10-CM

## 2018-11-06 NOTE — Progress Notes (Signed)
Office Visit Note   Patient: Terry Howard           Date of Birth: September 06, 1955           MRN: [id number] Visit Date: 11/06/2018              Requested by: [name], DO [address] [place], [place] [address] PCP: [name], DO   Assessment & Plan: Visit Diagnoses:  1. Cubital tunnel syndrome on left     Plan: Impression is left elbow recurrent cubital tunnel syndrome.  We will refer the patient back to Dr. Alvester Morin for a new nerve conduction study/EMG.  He will follow-up with [place] once that has been completed.  Follow-Up Instructions: Return in about 2 weeks (around 11/20/2018).   Orders:  Orders Placed This Encounter  Procedures  . Ambulatory referral to Physical Medicine Rehab   No orders of the defined types were placed in this encounter.     Procedures: No procedures performed   Clinical Data: No additional findings.   Subjective: Chief Complaint  Patient presents with  . Right Shoulder - Pain, Follow-up    HPI patient is a pleasant 63 year old gentleman who presents our clinic today with recurrent left elbow pain and ulnar sided numbness.  This began approximately 6 months ago.  No known injury or change in activity.  He is status post left cubital tunnel release by Dr. Deno Etienne in February 2016.  He was doing well until 6 months ago and his symptoms returned.  He notes constant pain to the left medial epicondyle with numbness radiating down the forearm through the ulnar nerve distribution and into the small finger.  There is nothing that seems to make this worse and nothing that seems to improve his symptoms.  He has been taking ibuprofen which does not seem to help.  He denies any weakness.  Review of Systems as detailed in HPI.  All others reviewed and are negative.   Objective: Vital Signs: There were no vitals taken for this visit.  Physical Exam well-developed and well-nourished gentleman in no acute distress.  Alert and oriented x3.   Ortho Exam examination of his left elbow reveals very minimal tenderness over the medial epicondyle.  Decreased sensation to the ulnar nerve distribution.  Full range of motion and strength.  Positive cubital tunnel at the elbow.  Specialty Comments:  No specialty comments available.  Imaging: No new imaging   PMFS History: Patient Active Problem List   Diagnosis Date Noted  . Cubital tunnel syndrome on left 11/06/2018  . Gum hypertrophy 06/06/2018  . Gum inflammation 06/06/2018  . Disability of walking 06/06/2018  . 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 Insomnia 11/26/2017  . Habitual snoring 11/26/2017  . Mood disorder (HCC) 11/26/2017  . Chronic pansinusitis 09/25/2017  . Deviated septum 09/05/2017  . Hypertrophy, nasal, turbinate 09/05/2017  . Perennial allergic rhinitis 09/05/2017  . Impingement syndrome of left shoulder 04/29/2017  . Chronic left shoulder pain 04/22/2017  . Strain of right hip adductor muscle 02/05/2017  . Statin intolerance 12/13/2016  . Vitamin D insufficiency 06/21/2016  . Elevated TSH 06/21/2016  . External hemorrhoids 05/18/2016  . Benign prostatic hyperplasia  L>R; without lower urinary tract symptoms 05/18/2016  . Health education/counseling 02/25/2016  . Restless legs syndrome (RLS) 02/15/2016  . Erectile dysfunction 02/15/2016  . h/o CLinically Signficant Anxiety 01/05/2014  . h/o Clinical depression 12/31/2013  . Hyperlipidemia 12/18/2013  . Genital Herpes 11/18/2013  . Cannot  sleep 11/18/2013  . Extremity pain 11/18/2013  . Abnormal findings on diagnostic imaging of abdomen 10/29/2013  . 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 Polypharmacy 08/18/2013  . Other long term (current) drug therapy 08/18/2013  . Chronic pain 08/11/2013  . Other specified disorders of urinary tract 07/09/2012  . Abnormality of gait 07/09/2012  . Lumbago 07/09/2012  . Disturbance of skin sensation 07/09/2012  . Other specified  paralytic syndrome 07/09/2012   Past Medical History:  Diagnosis Date  . Arthritis   . Chronic insomnia   . Chronic low back pain    Annular tear at the L2-3 and L4-5 levels 4  . Depression   . Gait disorder   . Hereditary spastic paraparesis (HCC)   . Hyperlipidemia   . 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 Neurogenic bladder   . RLS (restless legs syndrome)     Family History  Problem Relation Age of Onset  . Cancer Mother        breast cancer  . Other Mother   . Hyperlipidemia Mother   . Stroke Father        Intracranial hemorrhage  . Other Brother   . Other Brother   . Other Brother   . Colon cancer Neg Hx   . Colon polyps Neg Hx   . Esophageal cancer Neg Hx   . Stomach cancer Neg Hx   . Rectal cancer Neg Hx     Past Surgical History:  Procedure Laterality Date  . APPENDECTOMY    . BREAST SURGERY Bilateral    cysts  . COLONOSCOPY    . KNEE ARTHROSCOPY     right  . SHOULDER SURGERY     Rotator cuff, left  . ULNAR TUNNEL RELEASE Left 09/10/2014   Procedure: LEFT CUBITAL TUNNEL RELEASE;  Surgeon: [name], MD;  Location: [facility];  Service: Orthopedics;  Laterality: Left;   Social History   Occupational History  . Occupation: Unemployed    Comment: Disability  Tobacco Use  . Smoking status: Never Smoker  . Smokeless tobacco: Never Used  Substance and Sexual Activity  . Alcohol use: Yes    Alcohol/week: 7.0 standard drinks    Types: 7 Cans of beer per week    Comment: Consumes alcohol on occasion  . Drug use: No  . Sexual activity: Yes    Birth control/protection: Other-see comments    Comment: vasectomy

## 2018-11-24 MED FILL — rOPINIRole HCL 2 MG TABS: 2 | 90 days supply | Qty: 45 | Fill #0 | Status: TO

## 2018-12-08 ENCOUNTER — Encounter: Payer: Self-pay | Admitting: Physical Medicine and Rehabilitation

## 2018-12-08 ENCOUNTER — Other Ambulatory Visit: Payer: Self-pay

## 2018-12-08 ENCOUNTER — Ambulatory Visit: Payer: No Typology Code available for payment source | Admitting: Physical Medicine and Rehabilitation

## 2018-12-08 ENCOUNTER — Ambulatory Visit: Payer: No Typology Code available for payment source | Admitting: Family Medicine

## 2018-12-08 DIAGNOSIS — R202 Paresthesia of skin: Secondary | ICD-10-CM | POA: Diagnosis not present

## 2018-12-08 NOTE — Progress Notes (Signed)
  Numeric Pain Rating Scale and Functional Assessment Average Pain 7   In the last MONTH (on 0-10 scale) has pain interfered with the following?  1. General activity like being  able to carry out your everyday physical activities such as walking, climbing stairs, carrying groceries, or moving a chair?  Rating(0)

## 2018-12-10 ENCOUNTER — Ambulatory Visit: Payer: No Typology Code available for payment source | Admitting: Orthopaedic Surgery

## 2018-12-10 ENCOUNTER — Encounter: Payer: Self-pay | Admitting: Orthopaedic Surgery

## 2018-12-10 ENCOUNTER — Other Ambulatory Visit: Payer: Self-pay

## 2018-12-10 DIAGNOSIS — M25522 Pain in left elbow: Secondary | ICD-10-CM

## 2018-12-10 NOTE — Procedures (Signed)
EMG & NCV Findings: Evaluation of the left median motor nerve showed prolonged distal onset latency (4.5 ms) and decreased conduction velocity (Elbow-Wrist, 49 m/s).  The left median (across palm) sensory nerve showed no response (Palm) and prolonged distal peak latency (4.9 ms).  The left ulnar sensory nerve showed prolonged distal peak latency (4.4 ms) and decreased conduction velocity (Wrist-5th Digit, 32 m/s).  All remaining nerves (as indicated in the following tables) were within normal limits.    All examined muscles (as indicated in the following table) showed no evidence of electrical instability.    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  This lesion is unchanged from prior electrodiagnostic study.    **There is very mild sensory slowing of the ulnar nerve distally but there is no voltage drop across the elbow and this would represent a significant improvement from the prior electrodiagnostic study and post surgical decompression.  The patient could have a distal mild ulnar neuropathy or potentially could have a mild irritation of the ulnar nerve at the elbow but this is not showing as a voltage drop across the elbow.    There is no significant electrodiagnostic evidence of any other focal nerve entrapment, brachial plexopathy or cervical radiculopathy  Recommendations: 1.  Follow-up with referring physician. 2.  Continue current management of symptoms. 3.  Suggest potential diagnostic and therapeutic injection.  ___________________________ Naaman Plummer FAAPMR Board Certified, American Board of Physical Medicine and Rehabilitation    Nerve Conduction Studies Anti Sensory Summary Table   Stim Site NR Peak (ms) Norm Peak (ms) P-T Amp (V) Norm P-T Amp Site1 Site2 Delta-P (ms) Dist (cm) Vel (m/s) Norm Vel (m/s)  Left Median Acr Palm  Anti Sensory (2nd Digit)  32.1C  Wrist    *4.9 <3.6 22.0 >10 Wrist Palm  0.0    Palm *NR  <2.0          Left Radial Anti Sensory (Base 1st Digit)  33.8C  Wrist    2.5 <3.1 18.7  Wrist Base 1st Digit 2.5 0.0    Left Ulnar Anti Sensory (5th Digit)  32C  Wrist    *4.4 <3.7 16.9 >15.0 Wrist 5th Digit 4.4 14.0 *32 >38   Motor Summary Table   Stim Site NR Onset (ms) Norm Onset (ms) O-P Amp (mV) Norm O-P Amp Site1 Site2 Delta-0 (ms) Dist (cm) Vel (m/s) Norm Vel (m/s)  Left Median Motor (Abd Poll Brev)  33.2C  Wrist    *4.5 <4.2 5.9 >5 Elbow Wrist 4.5 22.0 *49 >50  Elbow    9.0  5.9         Left Ulnar Motor (Abd Dig Min)  31.5C  Wrist    3.7 <4.2 8.4 >3 B Elbow Wrist 3.3 20.5 62 >53  B Elbow    7.0  8.2  A Elbow B Elbow 1.5 9.5 63 >53  A Elbow    8.5  8.0          EMG   Side Muscle Nerve Root Ins Act Fibs Psw Amp Dur Poly Recrt Int Dennie Bible Comment  Left Abd Poll Brev Median C8-T1 Nml Nml Nml Nml Nml 0 Nml Nml   Left 1stDorInt Ulnar C8-T1 Nml Nml Nml Nml Nml 0 Nml Nml   Left PronatorTeres Median C6-7 Nml Nml Nml Nml Nml 0 Nml Nml   Left Biceps Musculocut C5-6 Nml Nml Nml Nml Nml  0 Nml Nml   Left Deltoid Axillary C5-6 Nml Nml Nml Nml Nml 0 Nml Nml     Nerve Conduction Studies Anti Sensory Left/Right Comparison   Stim Site L Lat (ms) R Lat (ms) L-R Lat (ms) L Amp (V) R Amp (V) L-R Amp (%) Site1 Site2 L Vel (m/s) R Vel (m/s) L-R Vel (m/s)  Median Acr Palm Anti Sensory (2nd Digit)  32.1C  Wrist *4.9   22.0   Wrist Palm     Palm             Radial Anti Sensory (Base 1st Digit)  33.8C  Wrist 2.5   18.7   Wrist Base 1st Digit     Ulnar Anti Sensory (5th Digit)  32C  Wrist *4.4   16.9   Wrist 5th Digit *32     Motor Left/Right Comparison   Stim Site L Lat (ms) R Lat (ms) L-R Lat (ms) L Amp (mV) R Amp (mV) L-R Amp (%) Site1 Site2 L Vel (m/s) R Vel (m/s) L-R Vel (m/s)  Median Motor (Abd Poll Brev)  33.2C  Wrist *4.5   5.9   Elbow Wrist *49    Elbow 9.0   5.9         Ulnar Motor  (Abd Dig Min)  31.5C  Wrist 3.7   8.4   B Elbow Wrist 62    B Elbow 7.0   8.2   A Elbow B Elbow 63    A Elbow 8.5   8.0            Waveforms:

## 2018-12-10 NOTE — Progress Notes (Signed)
Terry Howard - 63 y.o. male MRN [id number]  Date of birth: February 13, 1956  Office Visit Note: Visit Date: 12/08/2018 PCP: Thomasene Lotpalski, Deborah, DO Referred by: Thomasene Lotpalski, Deborah, DO  Subjective: Chief Complaint  Patient presents with  . Left Arm - Numbness   HPI: Terry CyphersSteven M Howard is a 63 y.o. male who comes in today At the request of Dr. Glee ArvinMichael Xu for electrodiagnostic study of the left upper limb with concern for recurrent ulnar neuropathy.  Patient is right-hand dominant but does suffer from history of spinal muscle atrophy and paraplegia.  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  He also had a moderate median nerve neuropathy at the wrist but this was asymptomatic at the time.  He went on to have cubital tunnel release in 2016 and did extremely well up until just recently.  He says probably 6 months now he has had recurrent numbness in the left arm from the shoulder to the fifth digit particularly when turning the arm.  His symptoms are very dependent on turning the arm dynamically.  He also gets some numbness when he is sleeping.  Symptoms appear to him to go from the medial elbow down to the fifth digit.  He has not noticed any weakness.  Rates his pain a 7 out of 10.  Has not done well with current medication.  ROS Otherwise per HPI.  Assessment & Plan: Visit Diagnoses:  1. Paresthesia of skin     Plan: Impression: The above electrodiagnostic study is ABNORMAL and reveals evidence of a moderate "asymptomatic" left median nerve entrapment at the wrist affecting sensory and motor components.  This lesion is unchanged from prior electrodiagnostic study.    **There is very mild sensory slowing of the ulnar nerve distally but there is no voltage drop across the elbow and this would represent a significant improvement from the prior electrodiagnostic study and post surgical decompression.  The patient could  have a distal mild ulnar neuropathy or potentially could have a mild irritation of the ulnar nerve at the elbow but this is not showing as a voltage drop across the elbow.    There is no significant electrodiagnostic evidence of any other focal nerve entrapment, brachial plexopathy or cervical radiculopathy  Recommendations: 1.  Follow-up with referring physician. 2.  Continue current management of symptoms. 3.  Suggest potential diagnostic and therapeutic injection.   Meds & Orders: No orders of the defined types were placed in this encounter.   Orders Placed This Encounter  Procedures  . NCV with EMG (electromyography)    Follow-up: Return for  Glee ArvinMichael Xu, M.D..   Procedures: No procedures performed  EMG & NCV Findings: Evaluation of the left median motor nerve showed prolonged distal onset latency (4.5 ms) and decreased conduction velocity (Elbow-Wrist, 49 m/s).  The left median (across palm) sensory nerve showed no response (Palm) and prolonged distal peak latency (4.9 ms).  The left ulnar sensory nerve showed prolonged distal peak latency (4.4 ms) and decreased conduction velocity (Wrist-5th Digit, 32 m/s).  All remaining nerves (as indicated in the following tables) were within normal limits.    All examined muscles (as indicated in the following table) showed no evidence of electrical instability.    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  This lesion is unchanged from prior  electrodiagnostic study.    **There is very mild sensory slowing of the ulnar nerve distally but there is no voltage drop across the elbow and this would represent a significant improvement from the prior electrodiagnostic study and post surgical decompression.  The patient could have a distal mild ulnar neuropathy or potentially could have a mild irritation of  the ulnar nerve at the elbow but this is not showing as a voltage drop across the elbow.    There is no significant electrodiagnostic evidence of any other focal nerve entrapment, brachial plexopathy or cervical radiculopathy  Recommendations: 1.  Follow-up with referring physician. 2.  Continue current management of symptoms. 3.  Suggest potential diagnostic and therapeutic injection.  ___________________________ Naaman Plummer FAAPMR Board Certified, American Board of Physical Medicine and Rehabilitation    Nerve Conduction Studies Anti Sensory Summary Table   Stim Site NR Peak (ms) Norm Peak (ms) P-T Amp (V) Norm P-T Amp Site1 Site2 Delta-P (ms) Dist (cm) Vel (m/s) Norm Vel (m/s)  Left Median Acr Palm Anti Sensory (2nd Digit)  32.1C  Wrist    *4.9 <3.6 22.0 >10 Wrist Palm  0.0    Palm *NR  <2.0          Left Radial Anti Sensory (Base 1st Digit)  33.8C  Wrist    2.5 <3.1 18.7  Wrist Base 1st Digit 2.5 0.0    Left Ulnar Anti Sensory (5th Digit)  32C  Wrist    *4.4 <3.7 16.9 >15.0 Wrist 5th Digit 4.4 14.0 *32 >38   Motor Summary Table   Stim Site NR Onset (ms) Norm Onset (ms) O-P Amp (mV) Norm O-P Amp Site1 Site2 Delta-0 (ms) Dist (cm) Vel (m/s) Norm Vel (m/s)  Left Median Motor (Abd Poll Brev)  33.2C  Wrist    *4.5 <4.2 5.9 >5 Elbow Wrist 4.5 22.0 *49 >50  Elbow    9.0  5.9         Left Ulnar Motor (Abd Dig Min)  31.5C  Wrist    3.7 <4.2 8.4 >3 B Elbow Wrist 3.3 20.5 62 >53  B Elbow    7.0  8.2  A Elbow B Elbow 1.5 9.5 63 >53  A Elbow    8.5  8.0          EMG   Side Muscle Nerve Root Ins Act Fibs Psw Amp Dur Poly Recrt Int Dennie Bible Comment  Left Abd Poll Brev Median C8-T1 Nml Nml Nml Nml Nml 0 Nml Nml   Left 1stDorInt Ulnar C8-T1 Nml Nml Nml Nml Nml 0 Nml Nml   Left PronatorTeres Median C6-7 Nml Nml Nml Nml Nml 0 Nml Nml   Left Biceps Musculocut C5-6 Nml Nml Nml Nml Nml 0 Nml Nml   Left Deltoid Axillary C5-6 Nml Nml Nml Nml Nml 0 Nml Nml     Nerve Conduction Studies  Anti Sensory Left/Right Comparison   Stim Site L Lat (ms) R Lat (ms) L-R Lat (ms) L Amp (V) R Amp (V) L-R Amp (%) Site1 Site2 L Vel (m/s) R Vel (m/s) L-R Vel (m/s)  Median Acr Palm Anti Sensory (2nd Digit)  32.1C  Wrist *4.9   22.0   Wrist Palm     Palm             Radial Anti Sensory (Base 1st Digit)  33.8C  Wrist 2.5   18.7   Wrist Base 1st Digit     Ulnar Anti Sensory (5th Digit)  32C  Wrist *4.4  16.9   Wrist 5th Digit *32     Motor Left/Right Comparison   Stim Site L Lat (ms) R Lat (ms) L-R Lat (ms) L Amp (mV) R Amp (mV) L-R Amp (%) Site1 Site2 L Vel (m/s) R Vel (m/s) L-R Vel (m/s)  Median Motor (Abd Poll Brev)  33.2C  Wrist *4.5   5.9   Elbow Wrist *49    Elbow 9.0   5.9         Ulnar Motor (Abd Dig Min)  31.5C  Wrist 3.7   8.4   B Elbow Wrist 62    B Elbow 7.0   8.2   A Elbow B Elbow 63    A Elbow 8.5   8.0            Waveforms:            Clinical History: EMG/NCS 08/18/2014 Impression: The above electrodiagnostic study is ABNORMAL and reveals evidence of: 1. A moderate left ulnar nerve entrapment at the elbow (cubital tunnel syndrome) affecting sensory and motor components.  2. An asymptomatic moderate left median nerve entrapment at the wrist affecting sensory and motor components.   There is no significant electrodiagnostic evidence of any other focal nerve entrapment, brachial plexopathy or cervical radiculopathy.   ------------------ MRI LUMBAR SPINE WITHOUT CONTRAST  Technique: Multiplanar and multiecho pulse sequences of the lumbar spine were obtained without intravenous contrast.  Comparison: None.  Findings: The numbering convention used for this exam terms L5-S1 as the last full intervertebral disc space above the sacrum. Dextroconvex lumbar scoliosis is present with the apex at L3. Vertebral body height is preserved. Degenerative endplate changes are present most pronounced on both sides of L2-L3 and involving the right aspect of L4-L5.  Spinal cord terminates posterior to L1. Paraspinal soft tissues grossly appear within normal limits.  T12-L1: Sagittal imaging only. Negative.  L1-L2: Disc desiccation and degeneration with circumferential disc bulge. Broad-based posterior bulging with minimal impression on the thecal sac but no significant stenosis.  L2-L3: Disc desiccation with circumferential disc bulge. Posterior broad-based bulge is left eccentric with mild left foraminal stenosis. Bulging disc contacts the exiting left L2 nerve root in the foramen.  L3-L4: Mild disc degeneration with shallow broad-based posterior bulging. Central canal and lateral recesses patent. Foramina patent.  L4-L5: Disc desiccation and degeneration with circumferential disc bulging. Mild to moderate right foraminal stenosis associated with endplate osteophytes and disc bulge. This potentially affects the right L4 nerve. Left foramen, central canal patent. Mild narrowing of the right lateral recess potentially affecting the descending right L5 nerve. Mild right facet degeneration.  L5-S1: Negative.  IMPRESSION: 1. L2-L3 disc desiccation with circumferential disc bulge. Left eccentric broad-based posterior bulging with mild left foraminal stenosis. Bulging disc contacts the exiting left L2 nerve in the foramen. 2. L4-L5 disc desiccation and degeneration with circumferential bulge. Mild to moderate right foraminal stenosis is multifactorial, associated with endplate spurring and disc. 3. Dextroconvex lumbar scoliosis with the apex at L3.   He reports that he has never smoked. He has never used smokeless tobacco. No results for input(s): HGBA1C, LABURIC in the last 8760 hours.  Objective:  VS:  HT:    WT:   BMI:     BP:   HR: bpm  TEMP: ( )  RESP:  Physical Exam  Ortho Exam Imaging: No results found.  Past Medical/Family/Surgical/Social History: Medications & Allergies reviewed per EMR, new medications updated. Patient  Active Problem List   Diagnosis Date Noted  .  Cubital tunnel syndrome on left 11/06/2018  . Gum hypertrophy 06/06/2018  . Gum inflammation 06/06/2018  . Disability of walking 06/06/2018  . 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 Insomnia 11/26/2017  . Habitual snoring 11/26/2017  . Mood disorder (HCC) 11/26/2017  . Chronic pansinusitis 09/25/2017  . Deviated septum 09/05/2017  . Hypertrophy, nasal, turbinate 09/05/2017  . Perennial allergic rhinitis 09/05/2017  . Impingement syndrome of left shoulder 04/29/2017  . Chronic left shoulder pain 04/22/2017  . Strain of right hip adductor muscle 02/05/2017  . Statin intolerance 12/13/2016  . Vitamin D insufficiency 06/21/2016  . Elevated TSH 06/21/2016  . External hemorrhoids 05/18/2016  . Benign prostatic hyperplasia  L>R; without lower urinary tract symptoms 05/18/2016  . Health education/counseling 02/25/2016  . Restless legs syndrome (RLS) 02/15/2016  . Erectile dysfunction 02/15/2016  . h/o CLinically Signficant Anxiety 01/05/2014  . h/o Clinical depression 12/31/2013  . Hyperlipidemia 12/18/2013  . Genital Herpes 11/18/2013  . Cannot sleep 11/18/2013  . Extremity pain 11/18/2013  . Abnormal findings on diagnostic imaging of abdomen 10/29/2013  . 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 Polypharmacy 08/18/2013  . Other long term (current) drug therapy 08/18/2013  . Chronic pain 08/11/2013  . Other specified disorders of urinary tract 07/09/2012  . Abnormality of gait 07/09/2012  . Lumbago 07/09/2012  . Disturbance of skin sensation 07/09/2012  . 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 Arthritis   . Chronic insomnia   . Chronic low back pain    Annular tear at the L2-3 and L4-5 levels 4  . Depression   . Gait disorder   . Hereditary spastic paraparesis (HCC)   . Hyperlipidemia   . 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 Neurogenic  bladder   . RLS (restless legs syndrome)    Family History  Problem Relation Age of Onset  . Cancer Mother        breast cancer  . Other Mother   . Hyperlipidemia Mother   . Stroke Father        Intracranial hemorrhage  . Other Brother   . Other Brother   . Other Brother   . Colon cancer Neg Hx   . Colon polyps Neg Hx   . Esophageal cancer Neg Hx   . Stomach cancer Neg Hx   . Rectal cancer Neg Hx    Past Surgical History:  Procedure Laterality Date  . APPENDECTOMY    . BREAST SURGERY Bilateral    cysts  . COLONOSCOPY    . KNEE ARTHROSCOPY     right  . SHOULDER SURGERY     Rotator cuff, left  . ULNAR TUNNEL RELEASE Left 09/10/2014   Procedure: LEFT CUBITAL TUNNEL RELEASE;  Surgeon: [name], MD;  Location:  [facility];  Service: Orthopedics;  Laterality: Left;   Social History   Occupational History  . Occupation: Unemployed    Comment: Disability  Tobacco Use  . Smoking status: Never Smoker  . Smokeless tobacco: Never Used  Substance and Sexual Activity  . Alcohol use: Yes    Alcohol/week: 7.0 standard drinks    Types: 7 Cans of beer per week    Comment: Consumes alcohol on occasion  . Drug use: No  . Sexual activity: Yes    Birth control/protection: Other-see comments    Comment: vasectomy

## 2018-12-10 NOTE — Progress Notes (Signed)
Office Visit Note   Patient: Terry Howard           Date of Birth: Jul 25, 1956           MRN: [id number] Visit Date: 12/10/2018              Requested by: Thomasene Lotpalski, Deborah, DO [address] [place], [place] [id number] PCP: Thomasene Lotpalski, Deborah, DO   Assessment & Plan: Visit Diagnoses:  1. Pain in left elbow     Plan: I believe the patient's symptoms are mechanical in nature as they are only caused with pronation of the forearm.  His previous surgery of cubital tunnel syndrome included a ulnar nerve transposition.  We have discussed with the patient that if he is unable to live with the symptoms, we could proceed with surgical intervention to bury the nerve little deeper.  He will try to live with this for now.  I provided him with samples of Pennsaid to use in the meantime.  He will follow-up with us should his symptoms worsen.  Follow-Up Instructions: Return if symptoms worsen or fail to improve.   Orders:  No orders of the defined types were placed in this encounter.  No orders of the defined types were placed in this encounter.     Procedures: No procedures performed   Clinical Data: No additional findings.   Subjective: Chief Complaint  Patient presents with   Left Arm - Pain, Follow-up    HPI patient is a pleasant 63 year old gentleman who presents our clinic today to discuss nerve conduction study/EMG left upper extremity.  He is little over 4 years out left cubital tunnel syndrome and subcutaneous transposition by Dr.Gianlucca Szymborski February 2016.  He was doing well until little over 6 months ago when his symptoms to include numbness and tingling returned.  The symptoms he has go from the medial elbow and into the small finger and occur with elbow pronation.  Recent nerve conduction study shows a mild sensory slowing the distal ulnar nerve.  He still has a moderate asymptomatic left median nerve entrapment which is unchanged from 4 years ago.  No neck pain and no cervical  spine pathology.  Review of Systems as detailed in HPI.  All others reviewed and are negative.   Objective: Vital Signs: There were no vitals taken for this visit.  Physical Exam well-developed well-nourished gentleman in no acute distress.  Alert and oriented x3.  Ortho Exam examination of his left elbow reveals moderate tenderness medial epicondyle.  Increased numbness and tingling with pronation of the forearm.  Cervical spine exam is unremarkable.  Specialty Comments:  No specialty comments available.  Imaging: No new imaging   PMFS History: Patient Active Problem List   Diagnosis Date Noted   Pain in left elbow 12/10/2018   Cubital tunnel syndrome on left 11/06/2018   Gum hypertrophy 06/06/2018   Gum inflammation 06/06/2018   Disability of walking 06/06/2018   Disability due to neurological disorder 06/06/2018   Insomnia 11/26/2017   Habitual snoring 11/26/2017   Mood disorder (HCC) 11/26/2017   Chronic pansinusitis 09/25/2017   Deviated septum 09/05/2017   Hypertrophy, nasal, turbinate 09/05/2017   Perennial allergic rhinitis 09/05/2017   Impingement syndrome of left shoulder 04/29/2017   Chronic left shoulder pain 04/22/2017   Strain of right hip adductor muscle 02/05/2017   Statin intolerance 12/13/2016   Vitamin D insufficiency 06/21/2016   Elevated TSH 06/21/2016   External hemorrhoids 05/18/2016   Benign prostatic hyperplasia  L>R; without  lower urinary tract symptoms 05/18/2016   Health education/counseling 02/25/2016   Restless legs syndrome (RLS) 02/15/2016   Erectile dysfunction 02/15/2016   h/o CLinically Signficant Anxiety 01/05/2014   h/o Clinical depression 12/31/2013   Hyperlipidemia 12/18/2013   Genital Herpes 11/18/2013   Cannot sleep 11/18/2013   Extremity pain 11/18/2013   Abnormal findings on diagnostic imaging of abdomen 10/29/2013   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   Polypharmacy  08/18/2013   Other long term (current) drug therapy 08/18/2013   Chronic pain 08/11/2013   Other specified disorders of urinary tract 07/09/2012   Abnormality of gait 07/09/2012   Lumbago 07/09/2012   Disturbance of skin sensation 07/09/2012   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HCC)    Hyperlipidemia    Mild carpal tunnel syndrome of right wrist    Neurogenic bladder    RLS (restless legs syndrome)     Family History  Problem Relation Age of Onset   Cancer Mother        breast cancer   Other Mother    Hyperlipidemia Mother    Stroke Father        Intracranial hemorrhage   Other Brother    Other Brother    Other Brother    Colon cancer Neg Hx    Colon polyps Neg Hx    Esophageal cancer Neg Hx    Stomach cancer Neg Hx    Rectal cancer Neg Hx     Past Surgical History:  Procedure Laterality Date   APPENDECTOMY     BREAST SURGERY Bilateral    cysts   COLONOSCOPY     KNEE ARTHROSCOPY     right   SHOULDER SURGERY     Rotator cuff, left   ULNAR TUNNEL RELEASE Left 09/10/2014   Procedure: LEFT CUBITAL TUNNEL RELEASE;  Surgeon: [name], MD;  Location: [facility];  Service: Orthopedics;  Laterality: Left;   Social History   Occupational History   Occupation: Unemployed    Comment: Disability  Tobacco Use   Smoking status: Never Smoker   Smokeless tobacco: Never Used  Substance and Sexual Activity   Alcohol use: Yes    Alcohol/week: 7.0 standard drinks    Types: 7 Cans of beer per week    Comment: Consumes alcohol on occasion   Drug use: No   Sexual activity: Yes    Birth control/protection: Other-see comments    Comment: vasectomy

## 2018-12-15 ENCOUNTER — Other Ambulatory Visit: Payer: Self-pay

## 2018-12-15 ENCOUNTER — Telehealth: Payer: Self-pay | Admitting: Orthopaedic Surgery

## 2018-12-15 MED ORDER — DICLOFENAC SODIUM 1.5 % TD SOLN: TRANSDERMAL | 0 refills | Status: DC

## 2018-12-15 NOTE — Telephone Encounter (Signed)
Sent in to pharm.

## 2018-12-15 NOTE — Telephone Encounter (Signed)
Pt called in to let Dr.Xu know that the trail medication for Tennsaid has been working very good and he'd like to continue taking it. He said you told him to call in so he he can get it refilled.  [facility] [address]  [phone]

## 2018-12-22 ENCOUNTER — Other Ambulatory Visit: Payer: Self-pay | Admitting: Family Medicine

## 2018-12-22 DIAGNOSIS — F39 Unspecified mood [affective] disorder: Secondary | ICD-10-CM

## 2018-12-23 ENCOUNTER — Telehealth: Payer: Self-pay | Admitting: Family Medicine

## 2018-12-23 ENCOUNTER — Telehealth: Payer: Self-pay

## 2018-12-23 NOTE — Telephone Encounter (Signed)
Patient called to see if provider would consider writing Rx for : (written by past prior provider -- Patient confess the he has tried to wean himself off unsuccessfully) after taking for past 20years unable to-- Patient also states has been getting Rx from a  [place] supplier not in [place])    clonazePAM (KLONOPIN) 2 MG tablet [[id number]] DISCONTINUED   Order Details  Dose: 2 mg Route: Oral Frequency: 2 times daily PRN for anxiety, 1/2 tablet in the morning and 1 at night  Dispense Quantity: -- Refills: -- Fills remaining: --        Sig: Take 2 mg by mouth 2 (two) times daily as needed for anxiety (1/2 tablet in the morning and 1 at night).       Discontinue Date:  10/28/2013 07:58 Discontinue User:  Demetrios Loll Discontinue Reason:  Error  Written Date: -- Expiration Date: -- Ordering Date: 01/06/13      ---Forwarding request to medical assistant for review w/provider & to call patient @ [phone] if any questions.  --glh

## 2018-12-23 NOTE — Telephone Encounter (Signed)
Patient is request an RX for Klonopin 2 mg patient states that he has been on in the past and that he has been getting them from the [place] but is no longer to get them.  He states it is the only thing that helps him sleep.  I advised the patient that he will need an OV;  Patient requested that I send it to Dr. Sharee Holster for review. MPulliam, CMA/RT(R)

## 2018-12-23 NOTE — Telephone Encounter (Signed)
Patient also request if Rx approved that refill order be sent to :   [id number] [place],  - [address] [phone] (Phone) [phone] (Fax)   --glh

## 2018-12-23 NOTE — Telephone Encounter (Signed)
He will need office visit to discuss this

## 2018-12-24 NOTE — Telephone Encounter (Signed)
Called and left message for the patient to call the office. MPulliam, CMA/RT(R)  

## 2018-12-24 NOTE — Telephone Encounter (Signed)
Called and left message to call the office. MPulliam, CMA/RT(R)  

## 2018-12-25 ENCOUNTER — Encounter: Payer: Self-pay | Admitting: Family Medicine

## 2018-12-25 ENCOUNTER — Other Ambulatory Visit: Payer: Self-pay

## 2018-12-25 ENCOUNTER — Ambulatory Visit (INDEPENDENT_AMBULATORY_CARE_PROVIDER_SITE_OTHER): Payer: No Typology Code available for payment source | Admitting: Family Medicine

## 2018-12-25 VITALS — Ht 67.0 in | Wt 143.0 lb

## 2018-12-25 DIAGNOSIS — G114 Hereditary spastic paraplegia: Secondary | ICD-10-CM | POA: Diagnosis not present

## 2018-12-25 DIAGNOSIS — G4700 Insomnia, unspecified: Secondary | ICD-10-CM | POA: Diagnosis not present

## 2018-12-25 DIAGNOSIS — F339 Major depressive disorder, recurrent, unspecified: Secondary | ICD-10-CM | POA: Diagnosis not present

## 2018-12-25 DIAGNOSIS — F39 Unspecified mood [affective] disorder: Secondary | ICD-10-CM | POA: Diagnosis not present

## 2018-12-25 DIAGNOSIS — G2581 Restless legs syndrome: Secondary | ICD-10-CM

## 2018-12-25 MED ORDER — CLONAZEPAM 1 MG PO TABS: 1.0000 mg | ORAL_TABLET | Freq: Every day | ORAL | 0 refills | Status: DC

## 2018-12-25 NOTE — Telephone Encounter (Signed)
Patient has appt today 12/25/2018. MPulliam, CMA/RT(R)

## 2018-12-25 NOTE — Progress Notes (Signed)
Telehealth office visit note for Terry Howard, D.O- at Primary Care at [facility]   I connected with current patient today and verified that I am speaking with the correct person using two identifiers.    Location of the patient: Home  Location of the provider: home Only the patient (+/- their family members at pt's discretion) and myself were participating in the encounter    - This visit type was conducted due to national recommendations for restrictions regarding the COVID-19 Pandemic (e.g. social distancing) in an effort to limit this patient's exposure and mitigate transmission in our community.  This format is felt to be most appropriate for this patient at this time.   - The patient did not have access to video technology or had technical difficulties with video requiring transitioning to audio format only. - No physical exam could be performed with this format, beyond that communicated to [place] by the patient/ family members as noted.   - Additionally my office staff/ schedulers discussed with the patient that there may be a monetary charge related to this service, depending on their medical insurance.   The patient expressed understanding, and agreed to proceed.       History of Present Illness:  Working with ortho docs and PM&R- "needs to get his nerve moved again".   Did a spinal nerve stimulator trial at [facility]- didn't help at all.  Per the recent Neuro notes- was baclofen pump trial.  Didn't help -   Sleep:  Takes 0.75 tabs nightly of trazadone- works great.   Pt frustrated/ exaccerbated by his heriditary condition today.   Does have depressed mood- which comes and goes.   A doc put him on clonazepam many yrs ago- 25 yrs.  1 mg nitely all this time.   He has been getting it from mail order from [place]  But can't get it due to covid now- they won't ship it.    He has been on 0.5 mg nightly- for 3 wks now- no s-effects.   This makes it difficult for  him to sleep on this dose, he feels more depressed and more tired.     -Patient tells me that when he does not have the full 1 mg nightly, that he really struggles with anxiety and depression, and his whole outlook on life is just much gloomier your.  He really struggles without it..    -Patient is very apologetic today that he did not notify me of this medicine he has been taking a whole time and getting from the [place]    Depression screen [facility] 2/9 12/25/2018 06/06/2018 04/21/2018 02/18/2018 01/08/2018  Decreased Interest 0 0 0 0 0  Down, Depressed, Hopeless 1 0 0 0 0  PHQ - 2 Score 1 0 0 0 0  Altered sleeping Tired, decreased energy 2 0 0 1 0  Change in appetite 1 0 0 0 0  Feeling bad or failure about yourself  1 0 0 0 0  Trouble concentrating 1 0 0 0 0  Moving slowly or fidgety/restless 3 0 0 0 0  Suicidal thoughts 0 0 0 0 0  PHQ-9 Score Difficult doing work/chores Very difficult Not difficult at all - Not difficult at all Not difficult at all  Some recent data might be hidden   GAD 7 : Generalized Anxiety Score 12/25/2018  Nervous, Anxious, on Edge 2  Control/stop worrying 2  Worry too much - different things 1  Trouble relaxing 1  Restless 1  Easily annoyed or irritable 2  Afraid - awful might happen 2  Total GAD 7 Score 11  Anxiety Difficulty Very difficult     Impression and Recommendations:    1. Depression, recurrent (HCC)   2. Hereditary spastic paraparesis (HCC)   3. Mood disorder (HCC)   4. Insomnia, unspecified type   5. Restless legs syndrome (RLS)     -Long discussion with patient regarding side effects of clonazepam and how it is on the beers list of high risk medications for folks who are older.  -He has been on it for 25 years and at this time I think weaning off might have more detriment than keeping him on it. (He has tried to wean himself off in the past and his mood just "gets horrible") also, it helps tremendously with his mood  and anxiety and, after discussion of all the risks of early dementia, fall risk increases etc. etc., patient desires the medicine. -He will come in in the near future for a urine drug screen and to sign paperwork for controlled substance contract for benzodiazepines.  I reviewed that he only can use 1 pharmacy, he can only get controlled substance from us etc.  If he breaks these contract terms, he will never be able to get controlled substances from us in the future.  He understands that. -Near future fasting blood work since he has not had a for couple years and will get urine drug screen and have him sign paperwork. -Refill given of clonazepam. -Follow-up in 1 month to see how treatment plan is doing; and also to review fasting blood work Catering manageretc.  - As part of my medical decision making, I reviewed the following data within the electronic MEDICAL RECORD NUMBER History obtained from pt /family, CMA notes reviewed and incorporated if applicable, Labs reviewed, Radiograph/ tests reviewed if applicable and OV notes from prior OV's with me, as well as other specialists she/he has seen since seeing me last, were all reviewed and used in my medical decision making process today.   - Additionally, discussion had with patient regarding txmnt plan, and their biases/concerns about that plan were used in my medical decision making today.   - The patient agreed with the plan and demonstrated an understanding of the instructions.   No barriers to understanding were identified.   - Red flag symptoms and signs discussed in detail.  Patient expressed understanding regarding what to do in case of emergency\ urgent symptoms.  The patient was advised to call back or seek an in-person evaluation if the symptoms worsen or if the condition fails to improve as anticipated.   Return in next 30 d- pt will come in for UDS and sign controlled sub. papers, for full set FBW, UDS/sign papers4control sub; then ov w me 1 mo-labs/new med.     No orders of the defined types were placed in this encounter.   Meds ordered this encounter  Medications   clonazePAM (KLONOPIN) 1 MG tablet    Sig: Take 1 tablet (1 mg total) by mouth at bedtime.    Dispense:  90 tablet    Refill:  0    Medications Discontinued During This Encounter  Medication Reason   clonazePAM (KLONOPIN) 2 MG tablet Reorder      I provided 23+ minutes of non-face-to-face time during this encounter,with over 50% of the time in direct counseling  on patients medical conditions/ medical concerns.  Additional time was spent with charting and coordination of care after the actual visit commenced.   Note:  This note was prepared with assistance of Dragon voice recognition software. Occasional wrong-word or sound-a-like substitutions may have occurred due to the inherent limitations of voice recognition software.  Terry Lot, DO     Patient Care Team    Relationship Specialty Notifications Start End  Terry Lot, DO PCP - General Family Medicine  02/15/16   Tyrell Antonio, MD Consulting Physician Physical Medicine and Rehabilitation  05/22/16   York Spaniel, MD Consulting Physician Neurology  05/22/16   Specialists, [facility]  Orthopedic Surgery  12/11/16   Suzanna Obey, MD Consulting Physician Otolaryngology  10/04/17   Iva Boop, MD Consulting Physician Gastroenterology  10/04/17   Drema Halon, MD Consulting Physician Otolaryngology  10/04/17   Ihor Gully, MD Consulting Physician Urology  10/04/17   Tyrell Antonio, MD Consulting Physician Physical Medicine and Rehabilitation  12/25/18 12/25/18   Comment: txing doc     -Vitals obtained; medications/ allergies reconciled;  personal medical, social, Sx etc.histories were updated by CMA, reviewed by me and are reflected in chart   Patient Active Problem List   Diagnosis Date Noted   Insomnia 11/26/2017    Priority: High   Mood disorder (HCC) 11/26/2017     Priority: High   Statin intolerance 12/13/2016    Priority: High   Restless legs syndrome (RLS) 02/15/2016    Priority: High   h/o CLinically Signficant Anxiety 01/05/2014    Priority: High   h/o Clinical depression 12/31/2013    Priority: High   Hyperlipidemia 12/18/2013    Priority: High   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    Priority: High   Elevated TSH 06/21/2016    Priority: Medium   Vitamin D insufficiency 06/21/2016    Priority: Low   Chronic pain 08/11/2013    Priority: Low   Abnormality of gait 07/09/2012    Priority: Low   Pain in left elbow 12/10/2018   Cubital tunnel syndrome on left 11/06/2018   Gum hypertrophy 06/06/2018   Gum inflammation 06/06/2018   Disability of walking 06/06/2018   Disability due to neurological disorder 06/06/2018   Habitual snoring 11/26/2017   Chronic pansinusitis 09/25/2017   Deviated septum 09/05/2017   Hypertrophy, nasal, turbinate 09/05/2017   Perennial allergic rhinitis 09/05/2017   Impingement syndrome of left shoulder 04/29/2017   Chronic left shoulder pain 04/22/2017   Strain of right hip adductor muscle 02/05/2017   External hemorrhoids 05/18/2016   Benign prostatic hyperplasia  L>R; without lower urinary tract symptoms 05/18/2016   Health education/counseling 02/25/2016   Erectile dysfunction 02/15/2016   Genital Herpes 11/18/2013   Cannot sleep 11/18/2013   Extremity pain 11/18/2013   Abnormal findings on diagnostic imaging of abdomen 10/29/2013   Polypharmacy 08/18/2013   Other long term (current) drug therapy 08/18/2013   Other specified disorders of urinary tract 07/09/2012   Lumbago 07/09/2012   Disturbance of skin sensation 07/09/2012   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   baclofen (LIORESAL) 10 MG tablet Take 2 tablets (20 mg total) by mouth at  bedtime.   Cholecalciferol (VITAMIN D3) 5000 units TABS 5,000 IU OTC vitamin D3 daily.   clonazePAM (KLONOPIN) 1 MG tablet Take 1 tablet (1 mg total) by mouth at  bedtime.   Diclofenac Sodium 1.5 % SOLN APPLY 2-4 GRAMS TO AFFECTED AREA BID   omega-3 acid ethyl esters (LOVAZA) 1 g capsule Take 2 capsules (2 g total) by mouth 2 (two) times daily.   rOPINIRole (REQUIP) 2 MG tablet TAKE 1/2 TABLET BY MOUTH DAILY.   traZODone (DESYREL) 100 MG tablet 0.5-1 tablet p.o. nightly as needed sleep   Turmeric Curcumin 500 MG CAPS Take 1 capsule by mouth daily.   [DISCONTINUED] clonazePAM (KLONOPIN) 2 MG tablet Take 1 mg by mouth at bedtime.   Current Facility-Administered Medications for the 12/25/18 encounter (Office Visit) with Terry Lot, DO  Medication   0.9 %  sodium chloride infusion     Allergies:  Allergies  Allergen Reactions   Prozac [Fluoxetine Hcl]     tolerate well due to SE -felt sluggish, tired, and anxious    Pregabalin Other (See Comments)    Intolerance, somnolence   Statins Other (See Comments)    Other reaction(s): Muscle Pain Other reaction(s): Muscle Pain Other reaction(s): Muscle Pain      ROS:  See above HPI for pertinent positives and negatives   Objective:   Height 5\' 7"  (1.702 m), weight 143 lb (64.9 kg).  (if some vitals are omitted, this means that patient was UNABLE to obtain them even though they were asked to get them prior to OV today.  They were asked to call us at their earliest convenience with these once obtained. )  General: A & O * 3; sounds in no acute distress; in usual state of health.  Skin: Pt confirms warm and dry extremities and pink fingertips HEENT: Pt confirms lips non-cyanotic Chest: Patient confirms normal chest excursion and movement Respiratory: speaking in full sentences, no conversational dyspnea; patient confirms no use of accessory muscles Psych: insight appears good, mood- appears full

## 2018-12-27 MED FILL — BACLOFEN 10 MG TABS: 10 | 90 days supply | Qty: 180 | Fill #3

## 2018-12-27 MED FILL — traZODone HCL 100 MG TABS: 100 | 90 days supply | Qty: 90 | Fill #3

## 2018-12-31 MED FILL — clonazePAM 1 MG TABS: 1 | 90 days supply | Qty: 90 | Fill #0

## 2019-01-13 ENCOUNTER — Ambulatory Visit: Payer: No Typology Code available for payment source | Admitting: Family Medicine

## 2019-01-19 ENCOUNTER — Ambulatory Visit (INDEPENDENT_AMBULATORY_CARE_PROVIDER_SITE_OTHER): Payer: No Typology Code available for payment source | Admitting: Family Medicine

## 2019-01-19 ENCOUNTER — Other Ambulatory Visit: Payer: Self-pay

## 2019-01-19 ENCOUNTER — Encounter: Payer: Self-pay | Admitting: Family Medicine

## 2019-01-19 VITALS — BP 118/76 | HR 78 | Temp 98.6°F | Ht 67.0 in | Wt 143.6 lb

## 2019-01-19 DIAGNOSIS — R29818 Other symptoms and signs involving the nervous system: Secondary | ICD-10-CM

## 2019-01-19 DIAGNOSIS — G2581 Restless legs syndrome: Secondary | ICD-10-CM

## 2019-01-19 DIAGNOSIS — G114 Hereditary spastic paraplegia: Secondary | ICD-10-CM | POA: Diagnosis not present

## 2019-01-19 DIAGNOSIS — F39 Unspecified mood [affective] disorder: Secondary | ICD-10-CM | POA: Diagnosis not present

## 2019-01-19 DIAGNOSIS — Z79899 Other long term (current) drug therapy: Secondary | ICD-10-CM

## 2019-01-19 DIAGNOSIS — Z0000 Encounter for general adult medical examination without abnormal findings: Secondary | ICD-10-CM

## 2019-01-19 NOTE — Progress Notes (Signed)
Impression and Recommendations:    1. Hereditary spastic paraparesis (HCC)   2. Mood disorder (HCC)   3. Restless legs syndrome (RLS)   4. Disability due to neurological disorder   5. High risk medication use-  benzo's   6. Encounter for wellness examination      Hereditary spastic paraparesis (HCC) -treatment per specialist.  Once patient completely weaned himself off the clonazepam that he is currently taking, patient encouraged to speak with his neurologist about even seeking consultation by a neuromuscular specialist to see if symptoms worsen, what additional treatments may be possible.  Mood disorder (HCC) - Plan: Stable, continue current medication regimen  Restless legs syndrome (RLS) - Plan: Symptoms stable.  Gets medicines from neurologist.  Disability due to neurological disorder - Plan: Patient doing remarkably well physically considering  High risk medication use-  benzo's - weening him off - Plan: Urine drugs of abuse scrn w alc, routine ([facility] [place] [facility]),    ---> We will await results and ensure that patient is only on the benzodiazepine.  If something else comes up positive patient understands we will not build to provide any more controlled substance to him. --> Controlled substance contract was signed today as well between patient and CMA.   Encounter for wellness examination - Plan: CBC With Differential, Comprehensive metabolic panel, Hemoglobin A1c, TSH, Lipid panel, Vitamin D 1,25 dihydroxy, T4, free,  -Patient will come in in the near future for his actual physical so he can meet guidelines per his medical insurance  Instructions per AVS given to patient: Please continue to wean down on clonazepam dose.   Please do so every 2 to 3 weeks.  Eventually, please take the smallest dose you can cut your tablet into every other day for a couple of weeks then every third day for a couple of weeks then off the medicine completely.  The prescription  I gave you should be enough to get you through until that occurs.  Then in the future, if this muscle spasms are particularly bad 1 day you can use it just as needed for really bad symptoms/ spasms-which hopefully will be every once in a great while  -Encourage you also to talk to Dr. Anne HahnWillis your neurologist about whether or not he feels seeing a neuromuscular specialist would be beneficial to explore other treatment options for you besides the clonazepam option you are doing on your own   Orders Placed This Encounter  Procedures  . CBC With Differential  . Comprehensive metabolic panel  . Hemoglobin A1c  . TSH  . Lipid panel  . Vitamin D 1,25 dihydroxy  . T4, free  . Urine drugs of abuse scrn w alc, routine ([facility] [place] [facility])   Gross side effects, risk and benefits, and alternatives of medications and treatment plan in general discussed with patient.  Patient is aware that all medications have potential side effects and we are unable to predict every side effect or drug-drug interaction that may occur.   Patient will call with any questions prior to using medication if they have concerns.    Expresses verbal understanding and consents to current therapy and treatment regimen.  No barriers to understanding were identified.  Red flag symptoms and signs discussed in detail.  Patient expressed understanding regarding what to do in case of emergency\urgent symptoms  Please see AVS handed out to patient at the end of our visit for further patient instructions/ counseling done pertaining  to today's office visit.   Return for Patient needs yearly physical in the near future for insurance purposes.yrly FBW done today.     Note:  This note was prepared with assistance of Dragon voice recognition software. Occasional wrong-word or sound-a-like substitutions may have occurred due to the inherent limitations of voice recognition software.       --------------------------------------------------------------------------------------------------------------------------------------------------------------------------------------------------------------------------------------------    Subjective:     HPI: Terry Howard is a 63 y.o. male who presents to [place] at [facility] today for issues as discussed below.  1 month ago we started patient on clonazepam-  actually is the first time I prescribe it for pt.  Patient has been on the medicine for over 25+ yrs - he has been getting it in the [place] prior.    He was supposed to have full set of fasting blood work, UDS and come in to get controlled substance contract signed however, presents to the clinic today for an office visit.  -Terry Howard has been doing well on cutting back on his clonazepam dose.  He is taking one third of tablets currently of his clonazepam and every 3 weeks he cuts down on that dose.  His symptoms have been well controlled.  He notes no increase in muscle spasticity.  Last Saturday however he help the neighbor cut down an 80 foot tall Oak tree and was on his knees cutting it out for his neighbor using his chainsaw.  After that incident he did have some significant muscle spasms and aches the next sev days.    -Otherwise he notes that his orthopedic Dr. Rigoberto Noel at [facility] recently gave him some diclofenac 1.5% topical solution for left upper extremity ulnar nerve pain.  He has been using it on his muscles and other aches.  Has been working great.  Overall he is tolerating this well.  Mood: Well controlled.  Patient has been in decent spirits considering everything that is going on his life and in the world.     Wt Readings from Last 3 Encounters:  01/19/19 143 lb 9.6 oz (65.1 kg)  12/25/18 143 lb (64.9 kg)  09/11/18 145 lb 5 oz (65.9 kg)   BP Readings from Last 3 Encounters:  01/19/19 118/76  09/11/18 111/67  08/21/18 120/78    Pulse Readings from Last 3 Encounters:  01/19/19 78  09/11/18 81  08/21/18 76   BMI Readings from Last 3 Encounters:  01/19/19 22.49 kg/m  12/25/18 22.40 kg/m  09/11/18 22.76 kg/m     Patient Care Team    Relationship Specialty Notifications Start End  Mellody Dance, DO PCP - General Family Medicine  02/15/16   Magnus Sinning, MD Consulting Physician Physical Medicine and Rehabilitation  05/22/16   Kathrynn Ducking, MD Consulting Physician Neurology  05/22/16   Specialists, [facility]  Orthopedic Surgery  12/11/16   Melissa Montane, MD Consulting Physician Otolaryngology  10/04/17   Gatha Mayer, MD Consulting Physician Gastroenterology  10/04/17   Rozetta Nunnery, MD Consulting Physician Otolaryngology  10/04/17   Kathie Rhodes, MD Consulting Physician Urology  10/04/17      Patient Active Problem List   Diagnosis Date Noted  . Insomnia 11/26/2017    Priority: High  . Mood disorder ([place]) 11/26/2017    Priority: High  . Statin intolerance 12/13/2016    Priority: High  . Restless legs syndrome (RLS) 02/15/2016    Priority: High  . h/o CLinically Signficant Anxiety 01/05/2014  Priority: High  . h/o Clinical depression 12/31/2013    Priority: High  . Hyperlipidemia 12/18/2013    Priority: High  . 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 Elevated TSH 06/21/2016    Priority: Medium  . Vitamin D insufficiency 06/21/2016    Priority: Low  . Chronic pain 08/11/2013    Priority: Low  . Abnormality of gait 07/09/2012    Priority: Low  . Pain in left elbow 12/10/2018  . Cubital tunnel syndrome on left 11/06/2018  . Gum hypertrophy 06/06/2018  . Gum inflammation 06/06/2018  . Disability of walking 06/06/2018  . 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 Habitual snoring 11/26/2017  . Chronic pansinusitis 09/25/2017  . Deviated septum 09/05/2017  . Hypertrophy, nasal, turbinate 09/05/2017  . Perennial allergic  rhinitis 09/05/2017  . Impingement syndrome of left shoulder 04/29/2017  . Chronic left shoulder pain 04/22/2017  . Strain of right hip adductor muscle 02/05/2017  . External hemorrhoids 05/18/2016  . Benign prostatic hyperplasia  L>R; without lower urinary tract symptoms 05/18/2016  . Health education/counseling 02/25/2016  . Erectile dysfunction 02/15/2016  . Genital Herpes 11/18/2013  . Cannot sleep 11/18/2013  . Extremity pain 11/18/2013  . Abnormal findings on diagnostic imaging of abdomen 10/29/2013  . Polypharmacy 08/18/2013  . Other long term (current) drug therapy 08/18/2013  . Other specified disorders of urinary tract 07/09/2012  . Lumbago 07/09/2012  . Disturbance of skin sensation 07/09/2012  . 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    Current Meds  Medication Sig  . acyclovir (ZOVIRAX) 800 MG tablet TAKE 1 TABLET BY MOUTH DAILY.  . baclofen (LIORESAL) 10 MG tablet Take 2 tablets (20 mg total) by mouth at bedtime.  . Cholecalciferol (VITAMIN D3) 5000 units TABS 5,000 IU OTC vitamin D3 daily.  . clonazePAM (KLONOPIN) 1 MG tablet Take 1 tablet (1 mg total) by mouth at bedtime.  . Diclofenac Sodium 1.5 % SOLN APPLY 2-4 GRAMS TO AFFECTED AREA BID  . omega-3 acid ethyl esters (LOVAZA) 1 g capsule Take 2 capsules (2 g total) by mouth 2 (two) times daily.  Marland Kitchen. rOPINIRole (REQUIP) 2 MG tablet TAKE 1/2 TABLET BY MOUTH DAILY.  . traZODone (DESYREL) 100 MG tablet 0.5-1 tablet p.o. nightly as needed sleep  . Turmeric Curcumin 500 MG CAPS Take 1 capsule by mouth daily.   Current Facility-Administered Medications for the 01/19/19 encounter (Office Visit) with Thomasene Lotpalski, Katty Fretwell, DO  Medication  . 0.9 %  sodium chloride infusion    Allergies:  Allergies  Allergen Reactions  . Prozac [Fluoxetine Hcl]      tolerate well due to SE -felt sluggish, tired, and anxious   . Pregabalin Other (See Comments)    Intolerance, somnolence  . Statins Other (See Comments)    Other reaction(s): Muscle Pain Other reaction(s): Muscle Pain Other reaction(s): Muscle Pain      Review of Systems:  A fourteen system review of systems was performed and found to be positive as per HPI.   Objective:   Blood pressure 118/76, pulse 78, temperature 98.6 F (37 C), height 5\' 7"  (1.702 m), weight 143 lb 9.6 oz (65.1 kg), SpO2 99 %. Body mass index is 22.49 kg/m. General:  Well Developed, well nourished, appropriate for stated age.  Neuro:  Alert and oriented,  extra-ocular muscles intact  HEENT:  Normocephalic, atraumatic, neck supple, no carotid bruits appreciated  Skin:  no gross rash, warm, pink. Cardiac:  RRR, S1 S2 Respiratory:  ECTA B/L and A/P, Not using accessory muscles, speaking in full sentences- unlabored. Vascular:  Ext warm, no cyanosis apprec.; cap RF less 2 sec. Psych:  No HI/SI, judgement and insight good, Euthymic mood. Full Affect.

## 2019-01-19 NOTE — Patient Instructions (Signed)
Please continue to wean down on clonazepam dose.   Please do so every 2 to 3 weeks.  Eventually, please take the smallest dose you can cut your tablet into every other day for a couple of weeks then every third day for a couple of weeks then off the medicine completely.  The prescription I gave you should be enough to get you through until that occurs.  Then in the future, if this muscle spasms are particularly bad 1 day you can use it just as needed for really bad symptoms/ spasms-which hopefully will be every once in a great while  -Encourage you also to talk to Dr. Jannifer Franklin your neurologist about whether or not he feels seeing a neuromuscular specialist would be beneficial to explore other treatment options for you besides the clonazepam option you are doing on your own  -

## 2019-01-21 LAB — URINE DRUGS OF ABUSE SCREEN W ALC, ROUTINE (REF LAB)

## 2019-01-22 LAB — OPIATES CONFIRMATION, URINE: OPIATES: NEGATIVE

## 2019-01-22 LAB — URINE DRUGS OF ABUSE SCREEN W ALC, ROUTINE (REF LAB)
Amphetamines, Urine: NEGATIVE ng/mL
Barbiturate Quant, Ur: NEGATIVE ng/mL
Benzodiazepine Quant, Ur: NEGATIVE ng/mL
Cannabinoid Quant, Ur: NEGATIVE ng/mL
Cocaine (Metab.): NEGATIVE ng/mL
Ethanol, Urine: NEGATIVE %
Methadone Screen, Urine: NEGATIVE ng/mL
PCP Quant, Ur: NEGATIVE ng/mL
Propoxyphene: NEGATIVE ng/mL

## 2019-01-24 LAB — COMPREHENSIVE METABOLIC PANEL
ALT: 28 IU/L (ref 0–44)
AST: 24 IU/L (ref 0–40)
Albumin/Globulin Ratio: 1.9 (ref 1.2–2.2)
Albumin: 4.2 g/dL (ref 3.8–4.8)
Alkaline Phosphatase: 106 IU/L (ref 39–117)
BUN/Creatinine Ratio: 21 (ref 10–24)
BUN: 19 mg/dL (ref 8–27)
Bilirubin Total: 0.5 mg/dL (ref 0.0–1.2)
CO2: 24 mmol/L (ref 20–29)
Calcium: 9.4 mg/dL (ref 8.6–10.2)
Chloride: 105 mmol/L (ref 96–106)
Creatinine, Ser: 0.91 mg/dL (ref 0.76–1.27)
GFR calc Af Amer: 104 mL/min/{1.73_m2} (ref >59)
GFR calc non Af Amer: 90 mL/min/{1.73_m2} (ref >59)
Globulin, Total: 2.2 g/dL (ref 1.5–4.5)
Glucose: 100 mg/dL — ABNORMAL HIGH (ref 65–99)
Potassium: 4.6 mmol/L (ref 3.5–5.2)
Sodium: 143 mmol/L (ref 134–144)
Total Protein: 6.4 g/dL (ref 6.0–8.5)

## 2019-01-24 LAB — CBC WITH DIFFERENTIAL
Basophils Absolute: 0 10*3/uL (ref 0.0–0.2)
Basos: 0 %
EOS (ABSOLUTE): 0.1 10*3/uL (ref 0.0–0.4)
Eos: 1 %
Hematocrit: 44.2 % (ref 37.5–51.0)
Hemoglobin: 15.4 g/dL (ref 13.0–17.7)
Immature Grans (Abs): 0 10*3/uL (ref 0.0–0.1)
Immature Granulocytes: 0 %
Lymphocytes Absolute: 1.2 10*3/uL (ref 0.7–3.1)
Lymphs: 22 %
MCH: 30.8 pg (ref 26.6–33.0)
MCHC: 34.8 g/dL (ref 31.5–35.7)
MCV: 88 fL (ref 79–97)
Monocytes Absolute: 0.5 10*3/uL (ref 0.1–0.9)
Monocytes: 10 %
Neutrophils Absolute: 3.6 10*3/uL (ref 1.4–7.0)
Neutrophils: 67 %
RBC: 5 x10E6/uL (ref 4.14–5.80)
RDW: 13.3 % (ref 11.6–15.4)
WBC: 5.4 10*3/uL (ref 3.4–10.8)

## 2019-01-24 LAB — TSH: TSH: 4.32 u[IU]/mL (ref 0.450–4.500)

## 2019-01-24 LAB — LIPID PANEL
Chol/HDL Ratio: 4.4 ratio (ref 0.0–5.0)
Cholesterol, Total: 209 mg/dL — ABNORMAL HIGH (ref 100–199)
HDL: 48 mg/dL (ref >39)
LDL Calculated: 142 mg/dL — ABNORMAL HIGH (ref 0–99)
Triglycerides: 93 mg/dL (ref 0–149)
VLDL Cholesterol Cal: 19 mg/dL (ref 5–40)

## 2019-01-24 LAB — VITAMIN D 1,25 DIHYDROXY
Vitamin D 1, 25 (OH)2 Total: 42 pg/mL
Vitamin D2 1, 25 (OH)2: <10 pg/mL
Vitamin D3 1, 25 (OH)2: 42 pg/mL

## 2019-01-24 LAB — HEMOGLOBIN A1C
Est. average glucose Bld gHb Est-mCnc: 91 mg/dL
Hgb A1c MFr Bld: 4.8 % (ref 4.8–5.6)

## 2019-01-24 LAB — T4, FREE: Free T4: 1.16 ng/dL (ref 0.82–1.77)

## 2019-02-03 ENCOUNTER — Ambulatory Visit (INDEPENDENT_AMBULATORY_CARE_PROVIDER_SITE_OTHER): Payer: No Typology Code available for payment source | Admitting: Family Medicine

## 2019-02-03 ENCOUNTER — Encounter: Payer: Self-pay | Admitting: Family Medicine

## 2019-02-03 ENCOUNTER — Other Ambulatory Visit: Payer: Self-pay

## 2019-02-03 VITALS — BP 134/88 | HR 102 | Temp 99.1°F | Ht 67.0 in | Wt 147.4 lb

## 2019-02-03 DIAGNOSIS — Z719 Counseling, unspecified: Secondary | ICD-10-CM

## 2019-02-03 DIAGNOSIS — Z0000 Encounter for general adult medical examination without abnormal findings: Secondary | ICD-10-CM | POA: Diagnosis not present

## 2019-02-03 NOTE — Progress Notes (Signed)
Male physical  Impression and Recommendations:    1. Encounter for wellness examination   2. Health education/counseling     1) Anticipatory Guidance: Discussed importance of wearing a seatbelt while driving, not texting while driving;   sunscreen when outside along with skin surveillance; eating a balanced and modest diet; physical activity at least 25 minutes per day or 150 min/ week moderate to intense activity.  2) Immunizations / Screenings / Labs:  All immunizations are up-to-date per recommendations or will be updated today. Patient is due for dental and vision screens which pt will schedule independently. Will obtain CBC, CMP, HgA1c, Lipid panel, TSH and vit D when fasting, if not already done recently.   3) Weight:  BMI meaning discussed with patient. Improve nutrient density of diet through increasing intake of fruits and vegetables and decreasing saturated fats, white flour products and refined sugars.   4) discussed with patient that Shingrix/shingles vaccine should be given for 15 older but his insurance does not pay for total 65 and older.  He wishes to forego this today.  -Because patient has history of hemorrhoids we will not send him with fecal occult blood test to do at home.  He will be following up with Dr. Arelia Longest his gastroenterologist regarding his hemorrhoids and possible banding. -He will follow-up with [facility] for yearly eye exams. -He will continue go to the dentist every 6 months. -Patient will also follow-up with his urologist to give him his penile injections for erectile dysfunction on a yearly basis for examination of his prostate as well as genitalia etc.   Please see AVS handed out to patient at the end of our visit for further patient instructions/ counseling done pertaining to today's office visit.  Follow-up preventative CPE in 1 year.   And then 4-20mo for chronic care and prn    Marjory Sneddon, DO 02/03/2019 1:57 PM     Subjective:    CC: CPE  HPI: Terry Howard is a 63 y.o. male who presents to [place] at [facility] today for a yearly health maintenance exam.     Health Maintenance Summary Reviewed and updated, unless pt declines services.  Colonoscopy:    Up-to-date done in 2019 repeat 10 years. Tobacco History Reviewed:   Never. CT scan for screening lung CA: Not applicable, patient has never smoked. Abdominal Ultrasound:   Not applicable, patient has never smoked.   Alcohol:    No concerns, no excessive use Exercise Habits: Essentially meeting AHA guidelines STD concerns:   none Drug Use:   None Birth control method:   n/a Testicular/penile concerns:    Patient gets penile injections from his urologist for ED.  Patient will go for yearly exams through them for prostate and testicles etc  -Patient continues to wean his Klonopin doses and is trying to get off it.  Immunization History  Administered Date(s) Administered  . Influenza,inj,Quad PF,6+ Mos 08/21/2018    Health Maintenance  Topic Date Due  . INFLUENZA VACCINE  03/07/2019  . TETANUS/TDAP  08/22/2019 (Originally 08/06/2018)  . COLONOSCOPY  08/27/2027  . Hepatitis C Screening  Completed  . HIV Screening  Completed      Wt Readings from Last 3 Encounters:  03/17/19 142 lb (64.4 kg)  03/10/19 141 lb 9.6 oz (64.2 kg)  02/03/19 147 lb 6.4 oz (66.9 kg)   BP Readings from Last 3 Encounters:  03/17/19 116/79  03/10/19 100/66  02/03/19 134/88  Pulse Readings from Last 3 Encounters:  03/17/19 81  03/10/19 82  02/03/19 (!) 102    Patient Active Problem List   Diagnosis Date Noted  . Insomnia 11/26/2017    Priority: High  . Mood disorder (HCC) 11/26/2017    Priority: High  . Statin intolerance 12/13/2016    Priority: High  . Restless legs syndrome (RLS) 02/15/2016    Priority: High  . h/o CLinically Signficant Anxiety 01/05/2014    Priority: High  . h/o Clinical depression 12/31/2013    Priority:  High  . Hyperlipidemia 12/18/2013    Priority: High  . 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 Elevated TSH 06/21/2016    Priority: Medium  . Vitamin D insufficiency 06/21/2016    Priority: Low  . Chronic pain 08/11/2013    Priority: Low  . Abnormality of gait 07/09/2012    Priority: Low  . Internal and external prolapsed hemorrhoids 03/10/2019  . Anal skin tags 03/10/2019  . Constipation 03/10/2019  . Pain in left elbow 12/10/2018  . Cubital tunnel syndrome on left 11/06/2018  . Gum hypertrophy 06/06/2018  . Gum inflammation 06/06/2018  . Disability of walking 06/06/2018  . 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 Habitual snoring 11/26/2017  . Chronic pansinusitis 09/25/2017  . Deviated septum 09/05/2017  . Hypertrophy, nasal, turbinate 09/05/2017  . Perennial allergic rhinitis 09/05/2017  . Impingement syndrome of left shoulder 04/29/2017  . Chronic left shoulder pain 04/22/2017  . Strain of right hip adductor muscle 02/05/2017  . External hemorrhoids 05/18/2016  . Benign prostatic hyperplasia  L>R; without lower urinary tract symptoms 05/18/2016  . Health education/counseling 02/25/2016  . Erectile dysfunction 02/15/2016  . Genital Herpes 11/18/2013  . Cannot sleep 11/18/2013  . Extremity pain 11/18/2013  . Abnormal findings on diagnostic imaging of abdomen 10/29/2013  . Polypharmacy 08/18/2013  . Other long term (current) drug therapy 08/18/2013  . Other specified disorders of urinary tract 07/09/2012  . Lumbago 07/09/2012  . Disturbance of skin sensation 07/09/2012  . 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 Arthritis   . Chronic insomnia   . Chronic low back pain    Annular tear at the L2-3 and L4-5 levels 4  . Depression   . Gait disorder   . Hereditary spastic paraparesis (HCC)   . Hyperlipidemia   . 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 Neurogenic  bladder   . RLS (restless legs syndrome)     Past Surgical History:  Procedure Laterality Date  . APPENDECTOMY    . BREAST SURGERY Bilateral    cysts  . COLONOSCOPY    . KNEE ARTHROSCOPY     right  . SHOULDER SURGERY     Rotator cuff, left  . ULNAR TUNNEL RELEASE Left 09/10/2014   Procedure: LEFT CUBITAL TUNNEL RELEASE;  Surgeon: Cheral AlmasNaiping Michael Xu, MD;  Location: [facility];  Service: Orthopedics;  Laterality: Left;    Family History  Problem Relation Age of Onset  . Cancer Mother        breast cancer  . Other Mother   . Hyperlipidemia Mother   . Stroke Father        Intracranial hemorrhage  . Other Brother   . Other Brother   . Other Brother   . Colon cancer Neg Hx   . Colon polyps Neg Hx   . Esophageal cancer Neg Hx   .  Stomach cancer Neg Hx   . Rectal cancer Neg Hx     Social History   Substance and Sexual Activity  Drug Use No  ,  Social History   Substance and Sexual Activity  Alcohol Use Yes  . Alcohol/week: 7.0 standard drinks  . Types: 7 Cans of beer per week   Comment: Consumes alcohol on occasion  ,  Social History   Tobacco Use  Smoking Status Never Smoker  Smokeless Tobacco Never Used  ,  Social History   Substance and Sexual Activity  Sexual Activity Yes  . Birth control/protection: Other-see comments   Comment: vasectomy    Patient's Medications  New Prescriptions   TIZANIDINE (ZANAFLEX) 2 MG TABLET    Take 1 tablet (2 mg total) by mouth 3 (three) times daily.  Previous Medications   ACYCLOVIR (ZOVIRAX) 800 MG TABLET    TAKE 1 TABLET BY MOUTH DAILY.   BACLOFEN (LIORESAL) 10 MG TABLET    Take 2 tablets (20 mg total) by mouth at bedtime.   CHOLECALCIFEROL (VITAMIN D3) 5000 UNITS TABS    5,000 IU OTC vitamin D3 daily.   CLONAZEPAM (KLONOPIN) 1 MG TABLET    Take 1 tablet (1 mg total) by mouth at bedtime.   DICLOFENAC SODIUM 1.5 % SOLN    APPLY 2-4 GRAMS TO AFFECTED AREA BID   OMEGA-3 ACID ETHYL ESTERS (LOVAZA) 1 G CAPSULE     Take 2 capsules (2 g total) by mouth 2 (two) times daily.   ROPINIROLE (REQUIP) 2 MG TABLET    TAKE 1/2 TABLET BY MOUTH DAILY.   TRAZODONE (DESYREL) 100 MG TABLET    0.5-1 tablet p.o. nightly as needed sleep   TURMERIC CURCUMIN 500 MG CAPS    Take 1 capsule by mouth daily.  Modified Medications   No medications on file  Discontinued Medications   No medications on file    Prozac [fluoxetine hcl], Pregabalin, and Statins  Review of Systems: General:   Denies fever, chills, unexplained weight loss.  Optho/Auditory:   Denies visual changes, blurred vision/LOV Respiratory:   Denies SOB, DOE more than baseline levels.  Cardiovascular:   Denies chest pain, palpitations, new onset peripheral edema  Gastrointestinal:   Denies nausea, vomiting, diarrhea.  Genitourinary: Denies dysuria, freq/ urgency, flank pain or discharge from genitals.  Endocrine:     Denies hot or cold intolerance, polyuria, polydipsia. Musculoskeletal:   Denies unexplained myalgias, joint swelling, unexplained arthralgias, gait problems.  Skin:  Denies rash, suspicious lesions Neurological:     Denies dizziness, unexplained weakness, numbness  Psychiatric/Behavioral:   Denies mood changes, suicidal or homicidal ideations, hallucinations    Objective:     Blood pressure 134/88, pulse (!) 102, temperature 99.1 F (37.3 C), height 5\' 7"  (1.702 m), weight 147 lb 6.4 oz (66.9 kg), SpO2 98 %. Body mass index is 23.09 kg/m. General Appearance:    Alert, cooperative, no distress, appears stated age  Head:    Normocephalic, without obvious abnormality, atraumatic  Eyes:    PERRL, conjunctiva/corneas clear, EOM's intact, fundi    benign, both eyes  Ears:    Normal TM's and external ear canals, both ears  Nose:   Nares normal, septum midline, mucosa normal, no drainage    or sinus tenderness  Throat:   Lips w/o lesion, mucosa moist, and tongue normal; teeth and   gums normal  Neck:   Supple, symmetrical, trachea  midline, no adenopathy;    thyroid:  no enlargement/tenderness/nodules; no carotid  bruit or JVD  Back:     Symmetric, no curvature, ROM normal, no CVA tenderness  Lungs:     Clear to auscultation bilaterally, respirations unlabored, no       Wh/ R/ R  Chest Wall:    No tenderness or gross deformity; normal excursion   Heart:    Regular rate and rhythm, S1 and S2 normal, no murmur, rub   or gallop  Abdomen:     Soft, non-tender, bowel sounds active all four quadrants, NO   G/R/R, no masses, no organomegaly  Genitalia:    Deferred- has urologist  Rectal:    Deferred to urology  Extremities:   Extremities normal, atraumatic, no cyanosis or gross edema  Pulses:   2+ and symmetric all extremities  Skin:   Warm, dry, Skin color, texture, turgor normal, no obvious rashes or lesions  M-Sk:   Ambulates * 4 w/ some difficulty, + spasticity  Neurologic:   CNII-XII intact, normal strength, sensation and reflexes    Throughout Psych:  No HI/SI, judgement and insight good, Euthymic mood. Full Affect.

## 2019-02-03 NOTE — Patient Instructions (Signed)
- Make sure you call [facility] for an eye exam at least every couple years.  - Again make sure you follow-up with your urologist for yrly exams- penile/ prostate.  -You can follow-up with Dr. Arelia Longest regarding the hemorrhoids and possible banding treatments.      Preventive Care 64 Years and Older, Male  Preventive care refers to lifestyle choices and visits with your health care provider that can promote health and wellness. What does preventive care include?   A yearly physical exam. This is also called an annual well check.  Dental exams once or twice a year.  Routine eye exams. Ask your health care provider how often you should have your eyes checked.  Personal lifestyle choices, including: ? Daily care of your teeth and gums. ? Regular physical activity. ? Eating a healthy diet. ? Avoiding tobacco and drug use. ? Limiting alcohol use. ? Practicing safe sex. ? Taking low doses of aspirin every day. ? Taking vitamin and mineral supplements as recommended by your health care provider. What happens during an annual well check? The services and screenings done by your health care provider during your annual well check will depend on your age, overall health, lifestyle risk factors, and family history of disease. Counseling Your health care provider may ask you questions about your:  Alcohol use.  Tobacco use.  Drug use.  Emotional well-being.  Home and relationship well-being.  Sexual activity.  Eating habits.  History of falls.  Memory and ability to understand (cognition).  Work and work Statistician. Screening You may have the following tests or measurements:  Height, weight, and BMI.  Blood pressure.  Lipid and cholesterol levels. These may be checked every 5 years, or more frequently if you are over 64 years old.  Skin check.  Lung cancer screening. You may have this screening every year starting at age 67 if you have a 30-pack-year  history of smoking and currently smoke or have quit within the past 15 years.  Colorectal cancer screening. All adults should have this screening starting at age 75 and continuing until age 47. You will have tests every 1-10 years, depending on your results and the type of screening test. People at increased risk should start screening at an earlier age. Screening tests may include: ? Guaiac-based fecal occult blood testing. ? Fecal immunochemical test (FIT). ? Stool DNA test. ? Virtual colonoscopy. ? Sigmoidoscopy. During this test, a flexible tube with a tiny camera (sigmoidoscope) is used to examine your rectum and lower colon. The sigmoidoscope is inserted through your anus into your rectum and lower colon. ? Colonoscopy. During this test, a long, thin, flexible tube with a tiny camera (colonoscope) is used to examine your entire colon and rectum.  Prostate cancer screening. Recommendations will vary depending on your family history and other risks.  Hepatitis C blood test.  Hepatitis B blood test.  Sexually transmitted disease (STD) testing.  Diabetes screening. This is done by checking your blood sugar (glucose) after you have not eaten for a while (fasting). You may have this done every 1-3 years.  Abdominal aortic aneurysm (AAA) screening. You may need this if you are a current or former smoker.  Osteoporosis. You may be screened starting at age 3 if you are at high risk. Talk with your health care provider about your test results, treatment options, and if necessary, the need for more tests. Vaccines Your health care provider may recommend certain vaccines, such as:  Influenza  vaccine. This is recommended every year.  Tetanus, diphtheria, and acellular pertussis (Tdap, Td) vaccine. You may need a Td booster every 10 years.  Varicella vaccine. You may need this if you have not been vaccinated.  Zoster vaccine. You may need this after age 43.  Measles, mumps, and rubella  (MMR) vaccine. You may need at least one dose of MMR if you were born in 1957 or later. You may also need a second dose.  Pneumococcal 13-valent conjugate (PCV13) vaccine. One dose is recommended after age 44.  Pneumococcal polysaccharide (PPSV23) vaccine. One dose is recommended after age 45.  Meningococcal vaccine. You may need this if you have certain conditions.  Hepatitis A vaccine. You may need this if you have certain conditions or if you travel or work in places where you may be exposed to hepatitis A.  Hepatitis B vaccine. You may need this if you have certain conditions or if you travel or work in places where you may be exposed to hepatitis B.  Haemophilus influenzae type b (Hib) vaccine. You may need this if you have certain risk factors. Talk to your health care provider about which screenings and vaccines you need and how often you need them. This information is not intended to replace advice given to you by your health care provider. Make sure you discuss any questions you have with your health care provider. Document Released: 08/19/2015 Document Revised: 09/12/2017 Document Reviewed: 05/24/2015 Elsevier Interactive Patient Education  2019 [place] for Adults, Male A healthy lifestyle and preventive care can promote health and wellness. Preventive health guidelines for men include the following key practices:  A routine yearly physical is a good way to check with your health care provider about your health and preventative screening. It is a chance to share any concerns and updates on your health and to receive a thorough exam.  Visit your dentist for a routine exam and preventative care every 6 months. Brush your teeth twice a day and floss once a day. Good oral hygiene prevents tooth decay and gum disease.  The frequency of eye exams is based on your age, health, family medical history, use of contact lenses, and other factors. Follow your  health care provider's recommendations for frequency of eye exams.  Eat a healthy diet. Foods such as vegetables, fruits, whole grains, low-fat dairy products, and lean protein foods contain the nutrients you need without too many calories. Decrease your intake of foods high in solid fats, added sugars, and salt. Eat the right amount of calories for you. Get information about a proper diet from your health care provider, if necessary.  Regular physical exercise is one of the most important things you can do for your health. Most adults should get at least 150 minutes of moderate-intensity exercise (any activity that increases your heart rate and causes you to sweat) each week. In addition, most adults need muscle-strengthening exercises on 2 or more days a week.  Maintain a healthy weight. The body mass index (BMI) is a screening tool to identify possible weight problems. It provides an estimate of body fat based on height and weight. Your health care provider can find your BMI and can help you achieve or maintain a healthy weight. For adults 20 years and older:  A BMI below 18.5 is considered underweight.  A BMI of 18.5 to 24.9 is normal.  A BMI of 25 to 29.9 is considered overweight.  A  BMI of 30 and above is considered obese.  Maintain normal blood lipids and cholesterol levels by exercising and minimizing your intake of saturated fat. Eat a balanced diet with plenty of fruit and vegetables. Blood tests for lipids and cholesterol should begin at age 37 and be repeated every 5 years. If your lipid or cholesterol levels are high, you are over 50, or you are at high risk for heart disease, you may need your cholesterol levels checked more frequently. Ongoing high lipid and cholesterol levels should be treated with medicines if diet and exercise are not working.  If you smoke, find out from your health care provider how to quit. If you do not use tobacco, do not start.  Lung cancer screening is  recommended for adults aged 22-80 years who are at high risk for developing lung cancer because of a history of smoking. A yearly low-dose CT scan of the lungs is recommended for people who have at least a 30-pack-year history of smoking and are a current smoker or have quit within the past 15 years. A pack year of smoking is smoking an average of 1 pack of cigarettes a day for 1 year (for example: 1 pack a day for 30 years or 2 packs a day for 15 years). Yearly screening should continue until the smoker has stopped smoking for at least 15 years. Yearly screening should be stopped for people who develop a health problem that would prevent them from having lung cancer treatment.  If you choose to drink alcohol, do not have more than 2 drinks per day. One drink is considered to be 12 ounces (355 mL) of beer, 5 ounces (148 mL) of wine, or 1.5 ounces (44 mL) of liquor.  Avoid use of street drugs. Do not share needles with anyone. Ask for help if you need support or instructions about stopping the use of drugs.  High blood pressure causes heart disease and increases the risk of stroke. Your blood pressure should be checked at least every 1-2 years. Ongoing high blood pressure should be treated with medicines, if weight loss and exercise are not effective.  If you are 90-77 years old, ask your health care provider if you should take aspirin to prevent heart disease.  Diabetes screening is done by taking a blood sample to check your blood glucose level after you have not eaten for a certain period of time (fasting). If you are not overweight and you do not have risk factors for diabetes, you should be screened once every 3 years starting at age 40. If you are overweight or obese and you are 59-4 years of age, you should be screened for diabetes every year as part of your cardiovascular risk assessment.  Colorectal cancer can be detected and often prevented. Most routine colorectal cancer screening begins at  the age of 1 and continues through age 18. However, your health care provider may recommend screening at an earlier age if you have risk factors for colon cancer. On a yearly basis, your health care provider may provide home test kits to check for hidden blood in the stool. Use of a small camera at the end of a tube to directly examine the colon (sigmoidoscopy or colonoscopy) can detect the earliest forms of colorectal cancer. Talk to your health care provider about this at age 19, when routine screening begins. Direct exam of the colon should be repeated every 5-10 years through age 37, unless early forms of precancerous polyps or small growths  are found.  People who are at an increased risk for hepatitis B should be screened for this virus. You are considered at high risk for hepatitis B if:  You were born in a country where hepatitis B occurs often. Talk with your health care provider about which countries are considered high risk.  Your parents were born in a high-risk country and you have not received a shot to protect against hepatitis B (hepatitis B vaccine).  You have HIV or AIDS.  You use needles to inject street drugs.  You live with, or have sex with, someone who has hepatitis B.  You are a man who has sex with other men (MSM).  You get hemodialysis treatment.  You take certain medicines for conditions such as cancer, organ transplantation, and autoimmune conditions.  Hepatitis C blood testing is recommended for all people born from [birth year] through 1965 and any individual with known risks for hepatitis C.  Practice safe sex. Use condoms and avoid high-risk sexual practices to reduce the spread of sexually transmitted infections (STIs). STIs include gonorrhea, chlamydia, syphilis, trichomonas, herpes, HPV, and human immunodeficiency virus (HIV). Herpes, HIV, and HPV are viral illnesses that have no cure. They can result in disability, cancer, and death.  If you are a man who has sex  with other men, you should be screened at least once per year for:  HIV.  Urethral, rectal, and pharyngeal infection of gonorrhea, chlamydia, or both.  If you are at risk of being infected with HIV, it is recommended that you take a prescription medicine daily to prevent HIV infection. This is called preexposure prophylaxis (PrEP). You are considered at risk if:  You are a man who has sex with other men (MSM) and have other risk factors.  You are a heterosexual man, are sexually active, and are at increased risk for HIV infection.  You take drugs by injection.  You are sexually active with a partner who has HIV.  Talk with your health care provider about whether you are at high risk of being infected with HIV. If you choose to begin PrEP, you should first be tested for HIV. You should then be tested every 3 months for as long as you are taking PrEP.  A one-time screening for abdominal aortic aneurysm (AAA) and surgical repair of large AAAs by ultrasound are recommended for men ages 38 to 66 years who are current or former smokers.  Healthy men should no longer receive prostate-specific antigen (PSA) blood tests as part of routine cancer screening. Talk with your health care provider about prostate cancer screening.  Testicular cancer screening is not recommended for adult males who have no symptoms. Screening includes self-exam, a health care provider exam, and other screening tests. Consult with your health care provider about any symptoms you have or any concerns you have about testicular cancer.  Use sunscreen. Apply sunscreen liberally and repeatedly throughout the day. You should seek shade when your shadow is shorter than you. Protect yourself by wearing long sleeves, pants, a wide-brimmed hat, and sunglasses year round, whenever you are outdoors.  Once a month, do a whole-body skin exam, using a mirror to look at the skin on your back. Tell your health care provider about new moles,  moles that have irregular borders, moles that are larger than a pencil eraser, or moles that have changed in shape or color.  Stay current with required vaccines (immunizations).  Influenza vaccine. All adults should be immunized every year.  Tetanus,  diphtheria, and acellular pertussis (Td, Tdap) vaccine. An adult who has not previously received Tdap or who does not know his vaccine status should receive 1 dose of Tdap. This initial dose should be followed by tetanus and diphtheria toxoids (Td) booster doses every 10 years. Adults with an unknown or incomplete history of completing a 3-dose immunization series with Td-containing vaccines should begin or complete a primary immunization series including a Tdap dose. Adults should receive a Td booster every 10 years.  Varicella vaccine. An adult without evidence of immunity to varicella should receive 2 doses or a second dose if he has previously received 1 dose.  Human papillomavirus (HPV) vaccine. Males aged 11-21 years who have not received the vaccine previously should receive the 3-dose series. Males aged 22-26 years may be immunized. Immunization is recommended through the age of 61 years for any male who has sex with males and did not get any or all doses earlier. Immunization is recommended for any person with an immunocompromised condition through the age of 38 years if he did not get any or all doses earlier. During the 3-dose series, the second dose should be obtained 4-8 weeks after the first dose. The third dose should be obtained 24 weeks after the first dose and 16 weeks after the second dose.  Zoster vaccine. One dose is recommended for adults aged 64 years or older unless certain conditions are present.  Measles, mumps, and rubella (MMR) vaccine. Adults born before [birth year] generally are considered immune to measles and mumps. Adults born in [birth year] or later should have 1 or more doses of MMR vaccine unless there is a contraindication to the  vaccine or there is laboratory evidence of immunity to each of the three diseases. A routine second dose of MMR vaccine should be obtained at least 28 days after the first dose for students attending postsecondary schools, health care workers, or international travelers. People who received inactivated measles vaccine or an unknown type of measles vaccine during 1963-1967 should receive 2 doses of MMR vaccine. People who received inactivated mumps vaccine or an unknown type of mumps vaccine before 1979 and are at high risk for mumps infection should consider immunization with 2 doses of MMR vaccine. Unvaccinated health care workers born before [birth year] who lack laboratory evidence of measles, mumps, or rubella immunity or laboratory confirmation of disease should consider measles and mumps immunization with 2 doses of MMR vaccine or rubella immunization with 1 dose of MMR vaccine.  Pneumococcal 13-valent conjugate (PCV13) vaccine. When indicated, a person who is uncertain of his immunization history and has no record of immunization should receive the PCV13 vaccine. All adults 32 years of age and older should receive this vaccine. An adult aged 26 years or older who has certain medical conditions and has not been previously immunized should receive 1 dose of PCV13 vaccine. This PCV13 should be followed with a dose of pneumococcal polysaccharide (PPSV23) vaccine. Adults who are at high risk for pneumococcal disease should obtain the PPSV23 vaccine at least 8 weeks after the dose of PCV13 vaccine. Adults older than 63 years of age who have normal immune system function should obtain the PPSV23 vaccine dose at least 1 year after the dose of PCV13 vaccine.  Pneumococcal polysaccharide (PPSV23) vaccine. When PCV13 is also indicated, PCV13 should be obtained first. All adults aged 5 years and older should be immunized. An adult younger than age 50 years who has certain medical conditions should be immunized. Any person  who  resides in a nursing home or long-term care facility should be immunized. An adult smoker should be immunized. People with an immunocompromised condition and certain other conditions should receive both PCV13 and PPSV23 vaccines. People with human immunodeficiency virus (HIV) infection should be immunized as soon as possible after diagnosis. Immunization during chemotherapy or radiation therapy should be avoided. Routine use of PPSV23 vaccine is not recommended for American Indians, [place] Natives, or people younger than 65 years unless there are medical conditions that require PPSV23 vaccine. When indicated, people who have unknown immunization and have no record of immunization should receive PPSV23 vaccine. One-time revaccination 5 years after the first dose of PPSV23 is recommended for people aged 19-64 years who have chronic kidney failure, nephrotic syndrome, asplenia, or immunocompromised conditions. People who received 1-2 doses of PPSV23 before age 55 years should receive another dose of PPSV23 vaccine at age 70 years or later if at least 5 years have passed since the previous dose. Doses of PPSV23 are not needed for people immunized with PPSV23 at or after age 55 years.  Meningococcal vaccine. Adults with asplenia or persistent complement component deficiencies should receive 2 doses of quadrivalent meningococcal conjugate (MenACWY-D) vaccine. The doses should be obtained at least 2 months apart. Microbiologists working with certain meningococcal bacteria, [employer] recruits, people at risk during an outbreak, and people who travel to or live in countries with a high rate of meningitis should be immunized. A first-year college student up through age 75 years who is living in a residence hall should receive a dose if he did not receive a dose on or after his 16th birthday. Adults who have certain high-risk conditions should receive one or more doses of vaccine.  Hepatitis A vaccine. Adults who wish  to be protected from this disease, have chronic liver disease, work with hepatitis A-infected animals, work in hepatitis A research labs, or travel to or work in countries with a high rate of hepatitis A should be immunized. Adults who were previously unvaccinated and who anticipate close contact with an international adoptee during the first 60 days after arrival in the [place] from a country with a high rate of hepatitis A should be immunized.  Hepatitis B vaccine. Adults should be immunized if they wish to be protected from this disease, are under age 74 years and have diabetes, have chronic liver disease, have had more than one sex partner in the past 6 months, may be exposed to blood or other infectious body fluids, are household contacts or sex partners of hepatitis B positive people, are clients or workers in certain care facilities, or travel to or work in countries with a high rate of hepatitis B.  Haemophilus influenzae type b (Hib) vaccine. A previously unvaccinated person with asplenia or sickle cell disease or having a scheduled splenectomy should receive 1 dose of Hib vaccine. Regardless of previous immunization, a recipient of a hematopoietic stem cell transplant should receive a 3-dose series 6-12 months after his successful transplant. Hib vaccine is not recommended for adults with HIV infection. Preventive Service / Frequency Ages 69 to 43  Blood pressure check.** / Every 3-5 years.  Lipid and cholesterol check.** / Every 5 years beginning at age 64.  Hepatitis C blood test.** / For any individual with known risks for hepatitis C.  Skin self-exam. / Monthly.  Influenza vaccine. / Every year.  Tetanus, diphtheria, and acellular pertussis (Tdap, Td) vaccine.** / Consult your health care provider. 1 dose of Td every 10  years.  Varicella vaccine.** / Consult your health care provider.  HPV vaccine. / 3 doses over 6 months, if 56 or younger.  Measles, mumps, rubella (MMR)  vaccine.** / You need at least 1 dose of MMR if you were born in 1957 or later. You may also need a second dose.  Pneumococcal 13-valent conjugate (PCV13) vaccine.** / Consult your health care provider.  Pneumococcal polysaccharide (PPSV23) vaccine.** / 1 to 2 doses if you smoke cigarettes or if you have certain conditions.  Meningococcal vaccine.** / 1 dose if you are age 2 to 56 years and a Market researcher living in a residence hall, or have one of several medical conditions. You may also need additional booster doses.  Hepatitis A vaccine.** / Consult your health care provider.  Hepatitis B vaccine.** / Consult your health care provider.  Haemophilus influenzae type b (Hib) vaccine.** / Consult your health care provider. Ages 56 to 55  Blood pressure check.** / Every year.  Lipid and cholesterol check.** / Every 5 years beginning at age 50.  Lung cancer screening. / Every year if you are aged 53-80 years and have a 30-pack-year history of smoking and currently smoke or have quit within the past 15 years. Yearly screening is stopped once you have quit smoking for at least 15 years or develop a health problem that would prevent you from having lung cancer treatment.  Fecal occult blood test (FOBT) of stool. / Every year beginning at age 107 and continuing until age 74. You may not have to do this test if you get a colonoscopy every 10 years.  Flexible sigmoidoscopy** or colonoscopy.** / Every 5 years for a flexible sigmoidoscopy or every 10 years for a colonoscopy beginning at age 21 and continuing until age 26.  Hepatitis C blood test.** / For all people born from [birth year] through 1965 and any individual with known risks for hepatitis C.  Skin self-exam. / Monthly.  Influenza vaccine. / Every year.  Tetanus, diphtheria, and acellular pertussis (Tdap/Td) vaccine.** / Consult your health care provider. 1 dose of Td every 10 years.  Varicella vaccine.** / Consult your health  care provider.  Zoster vaccine.** / 1 dose for adults aged 44 years or older.  Measles, mumps, rubella (MMR) vaccine.** / You need at least 1 dose of MMR if you were born in 1957 or later. You may also need a second dose.  Pneumococcal 13-valent conjugate (PCV13) vaccine.** / Consult your health care provider.  Pneumococcal polysaccharide (PPSV23) vaccine.** / 1 to 2 doses if you smoke cigarettes or if you have certain conditions.  Meningococcal vaccine.** / Consult your health care provider.  Hepatitis A vaccine.** / Consult your health care provider.  Hepatitis B vaccine.** / Consult your health care provider.  Haemophilus influenzae type b (Hib) vaccine.** / Consult your health care provider. Ages 67 and over  Blood pressure check.** / Every year.  Lipid and cholesterol check.**/ Every 5 years beginning at age 57.  Lung cancer screening. / Every year if you are aged 8-80 years and have a 30-pack-year history of smoking and currently smoke or have quit within the past 15 years. Yearly screening is stopped once you have quit smoking for at least 15 years or develop a health problem that would prevent you from having lung cancer treatment.  Fecal occult blood test (FOBT) of stool. / Every year beginning at age 61 and continuing until age 73. You may not have to do this test if you get  a colonoscopy every 10 years.  Flexible sigmoidoscopy** or colonoscopy.** / Every 5 years for a flexible sigmoidoscopy or every 10 years for a colonoscopy beginning at age 34 and continuing until age 88.  Hepatitis C blood test.** / For all people born from [birth year] through 1965 and any individual with known risks for hepatitis C.  Abdominal aortic aneurysm (AAA) screening.** / A one-time screening for ages 51 to 33 years who are current or former smokers.  Skin self-exam. / Monthly.  Influenza vaccine. / Every year.  Tetanus, diphtheria, and acellular pertussis (Tdap/Td) vaccine.** / 1 dose of Td  every 10 years.  Varicella vaccine.** / Consult your health care provider.  Zoster vaccine.** / 1 dose for adults aged 66 years or older.  Pneumococcal 13-valent conjugate (PCV13) vaccine.** / 1 dose for all adults aged 55 years and older.  Pneumococcal polysaccharide (PPSV23) vaccine.** / 1 dose for all adults aged 66 years and older.  Meningococcal vaccine.** / Consult your health care provider.  Hepatitis A vaccine.** / Consult your health care provider.  Hepatitis B vaccine.** / Consult your health care provider.  Haemophilus influenzae type b (Hib) vaccine.** / Consult your health care provider. **Family history and personal history of risk and conditions may change your health care provider's recommendations.   This information is not intended to replace advice given to you by your health care provider. Make sure you discuss any questions you have with your health care provider.   Document Released: 09/18/2001 Document Revised: 08/13/2014 Document Reviewed: 12/18/2010 Elsevier Interactive Patient Education [employer].

## 2019-03-10 ENCOUNTER — Ambulatory Visit: Payer: No Typology Code available for payment source | Admitting: Internal Medicine

## 2019-03-10 ENCOUNTER — Encounter: Payer: Self-pay | Admitting: Internal Medicine

## 2019-03-10 VITALS — BP 100/66 | HR 82 | Temp 98.8°F | Ht 67.0 in | Wt 141.6 lb

## 2019-03-10 DIAGNOSIS — K644 Residual hemorrhoidal skin tags: Secondary | ICD-10-CM | POA: Diagnosis not present

## 2019-03-10 DIAGNOSIS — K648 Other hemorrhoids: Secondary | ICD-10-CM

## 2019-03-10 DIAGNOSIS — K5900 Constipation, unspecified: Secondary | ICD-10-CM | POA: Insufficient documentation

## 2019-03-10 DIAGNOSIS — K5904 Chronic idiopathic constipation: Secondary | ICD-10-CM | POA: Diagnosis not present

## 2019-03-10 NOTE — Patient Instructions (Addendum)
HEMORRHOID BANDING PROCEDURE    FOLLOW-UP CARE   1. The procedure you have had should have been relatively painless since the banding of the area involved does not have nerve endings and there is no pain sensation.  The rubber band cuts off the blood supply to the hemorrhoid and the band may fall off as soon as 48 hours after the banding (the band may occasionally be seen in the toilet bowl following a bowel movement). You may notice a temporary feeling of fullness in the rectum which should respond adequately to plain Tylenol or Motrin.  2. Following the banding, avoid strenuous exercise that evening and resume full activity the next day.  A sitz bath (soaking in a warm tub) or bidet is soothing, and can be useful for cleansing the area after bowel movements.     3. To avoid constipation, take two tablespoons of natural wheat bran, natural oat bran, flax, Benefiber or any over the counter fiber supplement and increase your water intake to 7-8 glasses daily.  We are giving you a handout on this today.  4. Unless you have been prescribed anorectal medication, do not put anything inside your rectum for two weeks: No suppositories, enemas, fingers, etc.  5. Occasionally, you may have more bleeding than usual after the banding procedure.  This is often from the untreated hemorrhoids rather than the treated one.  Don't be concerned if there is a tablespoon or so of blood.  If there is more blood than this, lie flat with your bottom higher than your head and apply an ice pack to the area. If the bleeding does not stop within a half an hour or if you feel faint, call our office at [phone] or go to the emergency room.  6. Problems are not common; however, if there is a substantial amount of bleeding, severe pain, chills, fever or difficulty passing urine (very rare) or other problems, you should call us at [phone] or report to the nearest emergency room.  7. Do not stay seated  continuously for more than 2-3 hours for a day or two after the procedure.  Tighten your buttock muscles 10-15 times every two hours and take 10-15 deep breaths every 1-2 hours.  Do not spend more than a few minutes on the toilet if you cannot empty your bowel; instead re-visit the toilet at a later time.   We will see you 04/08/2019 at 8:30AM for additional banding.    I appreciate the opportunity to care for you. Silvano Rusk, MD, [facility]

## 2019-03-10 NOTE — Assessment & Plan Note (Signed)
RP and LL banded Benefiber 2 tbsp/day to treat constipation and hopefully prevent recurrence Consider adding MiraLax  F/U about 2 weeks and consider banding RA

## 2019-03-10 NOTE — Assessment & Plan Note (Signed)
Add benefiber 2 tbsp/day Consider adding MiraLAx Explained improving this will help prevent symptoms from hemorrhoids occurring in future

## 2019-03-10 NOTE — Progress Notes (Signed)
Terry Howard 63 y.o. Jul 16, 1956 [id number]  Assessment & Plan:  Internal and external prolapsed hemorrhoids RP and LL banded Benefiber 2 tbsp/day to treat constipation and hopefully prevent recurrence Consider adding MiraLax  F/U about 2 weeks and consider banding RA  Anal skin tags Explained that these may shrink but will persist and are not likely source of sxs  Constipation Add benefiber 2 tbsp/day Consider adding MiraLAx Explained improving this will help prevent symptoms from hemorrhoids occurring in future   I appreciate the opportunity to care for this patient. CC: Mellody Dance, DO    Subjective:   Chief Complaint: Hemorrhoids  HPI The patient presents with complaints of hemorrhoids, his wife had hemorrhoidal banding in this office by 1 of my partners recently and was very pleased and he wants to be evaluated for this.  He reports some mild fecal seepage at times, he has had some bulging that sounds like prolapsed and he has a pressure sensation in the anus and rectum frequently.  He does only move his bowels about 3 times a week and sometimes has to take laxatives.  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  He had a colonoscopy in 2019 that was normal.  He says he has tried some MiraLAX and fiber supplementations in the past but these have not made much difference regarding constipation.  I think he took them for weeks at a time. Allergies  Allergen Reactions  . Prozac [Fluoxetine Hcl]     tolerate well due to SE -felt sluggish, tired, and anxious   . Pregabalin Other (See Comments)    Intolerance, somnolence  . Statins Other (See Comments)    Other reaction(s): Muscle Pain Other reaction(s): Muscle Pain Other reaction(s): Muscle Pain    Current Meds  Medication Sig  . acyclovir (ZOVIRAX) 800 MG tablet TAKE 1 TABLET BY MOUTH DAILY.  . baclofen (LIORESAL) 10 MG tablet  Take 2 tablets (20 mg total) by mouth at bedtime.  . Cholecalciferol (VITAMIN D3) 5000 units TABS 5,000 IU OTC vitamin D3 daily.  . clonazePAM (KLONOPIN) 1 MG tablet Take 1 tablet (1 mg total) by mouth at bedtime.  . Diclofenac Sodium 1.5 % SOLN APPLY 2-4 GRAMS TO AFFECTED AREA BID  . omega-3 acid ethyl esters (LOVAZA) 1 g capsule Take 2 capsules (2 g total) by mouth 2 (two) times daily.  Marland Kitchen rOPINIRole (REQUIP) 2 MG tablet TAKE 1/2 TABLET BY MOUTH DAILY.  . traZODone (DESYREL) 100 MG tablet 0.5-1 tablet p.o. nightly as needed sleep  . Turmeric Curcumin 500 MG CAPS Take 1 capsule by mouth daily.   Past Medical History:  Diagnosis Date  . Arthritis   . Chronic insomnia   . Chronic low back pain    Annular tear at the L2-3 and L4-5 levels 4  . Depression   . Gait disorder   . 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 Hyperlipidemia   . 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 Neurogenic bladder   . RLS (restless legs syndrome)    Past Surgical History:  Procedure Laterality Date  . APPENDECTOMY    . BREAST SURGERY Bilateral    cysts  . COLONOSCOPY    . KNEE ARTHROSCOPY     right  . SHOULDER SURGERY     Rotator cuff, left  . ULNAR TUNNEL RELEASE Left 09/10/2014   Procedure: LEFT CUBITAL TUNNEL RELEASE;  Surgeon: Marianna Payment,  MD;  Location: [facility];  Service: Orthopedics;  Laterality: Left;   Social History   Social History Narrative   Lives with wife Banker(RN) and son   Disabled   Caffeine use: Drinks coffee sometimes in the am   Right handed   occ EtOH   Non-smoker (never)   No drugs   family history includes Cancer in his mother; Hyperlipidemia in his mother; Other in his brother, brother, brother, and mother; Stroke in his father.   Review of Systems As above  Objective:   Physical Exam BP 100/66   Pulse 82   Temp 98.8 F (37.1 C) (Oral)   Ht 5\' 7"  (1.702 m)   Wt 141 lb 9.6 oz (64.2 kg)   BMI 22.18 kg/m  NAD Ambulates with cane   Rectal - small flashy anal tags and visible prolapsed external hemorrhoids DRE - mild stenosis, no mass and non-tender  Anscopy Gr 2 internal hemorrhoids in all positions with some mildly inflamed external components also  PROCEDURE NOTE: The patient presents with symptomatic grade 2  hemorrhoids, requesting rubber band ligation of his/her hemorrhoidal disease.  All risks, benefits and alternative forms of therapy were described and informed consent was obtained.   The anorectum was pre-medicated with 5% lidocaine and .125% NTG The decision was made to band the RP and LLinternal hemorrhoid, and the [facility] was used to perform band ligation without complication.  Digital anorectal examination was then performed to assure proper positioning of the band, and to adjust the banded tissue as required.  The patient was discharged home without pain or other issues.  Dietary and behavioral recommendations were given and along with follow-up instructions.     The following adjunctive treatments were recommended: benefiber 2 tbsp qd  No complications were encountered and the patient tolerated the procedure well.

## 2019-03-10 NOTE — Assessment & Plan Note (Signed)
Explained that these may shrink but will persist and are not likely source of sxs

## 2019-03-17 ENCOUNTER — Encounter: Payer: Self-pay | Admitting: Neurology

## 2019-03-17 ENCOUNTER — Other Ambulatory Visit: Payer: Self-pay

## 2019-03-17 ENCOUNTER — Ambulatory Visit: Payer: No Typology Code available for payment source | Admitting: Neurology

## 2019-03-17 VITALS — BP 116/79 | HR 81 | Temp 98.0°F | Ht 67.0 in | Wt 142.0 lb

## 2019-03-17 DIAGNOSIS — G114 Hereditary spastic paraplegia: Secondary | ICD-10-CM | POA: Diagnosis not present

## 2019-03-17 MED ORDER — TIZANIDINE HCL 2 MG PO TABS: 2.0000 mg | ORAL_TABLET | Freq: Three times a day (TID) | ORAL | 2 refills | Status: DC

## 2019-03-17 MED FILL — tiZANidine HCL 2 MG TABS: 2 | 30 days supply | Qty: 90 | Fill #0

## 2019-03-17 MED FILL — rOPINIRole HCL 2 MG TABS: 2 | 90 days supply | Qty: 45 | Fill #0

## 2019-03-17 NOTE — Patient Instructions (Signed)
We will take Tizanidine 2 mg three times a day.

## 2019-03-17 NOTE — Progress Notes (Signed)
Reason for visit: Hereditary spastic paraparesis  Terry Howard is an 63 y.o. male  History of present illness:  Terry Howard is a 63 year old right-handed white male with a history of a gait disorder associated with a hereditary spastic paraparesis.  The patient apparently had a baclofen challenge after his last visit, this apparently surprisingly offered little benefit with his spasticity in the legs.  A baclofen pump was therefore not considered.  The patient is on a low-dose of baclofen at night taking 10 mg, he has tried to cut back on his clonazepam and his restless leg syndrome and spasticity in the legs has worsened.  The patient claims that he has been given AFO braces in the past but it worsens his ability to walk, and did not improve them.  The patient reports a numbness in the feet at night associated with his restless leg syndrome.  He has had an occasional fall but he is now using a walker for ambulation, he has not had any falls with a walker.  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[facility])    Hyperlipidemia    Mild carpal tunnel syndrome of right wrist    Neurogenic bladder    RLS (restless legs syndrome)     Past Surgical History:  Procedure Laterality Date   APPENDECTOMY     BREAST SURGERY Bilateral    cysts   COLONOSCOPY     KNEE ARTHROSCOPY     right   SHOULDER SURGERY     Rotator cuff, left   ULNAR TUNNEL RELEASE Left 09/10/2014   Procedure: LEFT CUBITAL TUNNEL RELEASE;  Surgeon: Marianna Payment, MD;  Location: [facility];  Service: Orthopedics;  Laterality: Left;    Family History  Problem Relation Age of Onset   Cancer Mother        breast cancer   Other Mother    Hyperlipidemia Mother    Stroke Father        Intracranial hemorrhage   Other  Brother    Other Brother    Other Brother    Colon cancer Neg Hx    Colon polyps Neg Hx    Esophageal cancer Neg Hx    Stomach cancer Neg Hx    Rectal cancer Neg Hx     Social history:  reports that he has never smoked. He has never used smokeless tobacco. He reports current alcohol use of about 7.0 standard drinks of alcohol per week. He reports that he does not use drugs.    Allergies  Allergen Reactions   Prozac [Fluoxetine Hcl]     tolerate well due to SE -felt sluggish, tired, and anxious    Pregabalin Other (See Comments)    Intolerance, somnolence   Statins Other (See Comments)    Other reaction(s): Muscle Pain Other reaction(s): Muscle Pain Other reaction(s): Muscle Pain     Medications:  Prior to Admission medications   Medication Sig Start Date End Date Taking? Authorizing Provider  acyclovir (ZOVIRAX) 800 MG tablet TAKE 1 TABLET BY MOUTH DAILY. 12/22/18  Yes Opalski, Neoma Laming, DO  baclofen (LIORESAL) 10 MG tablet Take 2 tablets (20 mg total) by mouth at bedtime. 04/10/18  Yes Kathrynn Ducking, MD  Cholecalciferol (VITAMIN D3) 5000  units TABS 5,000 IU OTC vitamin D3 daily. 06/21/16  Yes Opalski, Gavin Poundeborah, DO  clonazePAM (KLONOPIN) 1 MG tablet Take 1 tablet (1 mg total) by mouth at bedtime. 12/25/18  Yes Opalski, Gavin Poundeborah, DO  Diclofenac Sodium 1.5 % SOLN APPLY 2-4 GRAMS TO AFFECTED AREA BID 12/15/18  Yes Tarry KosXu, Naiping M, MD  omega-3 acid ethyl esters (LOVAZA) 1 g capsule Take 2 capsules (2 g total) by mouth 2 (two) times daily. 10/04/17  Yes Opalski, Deborah, DO  rOPINIRole (REQUIP) 2 MG tablet TAKE 1/2 TABLET BY MOUTH DAILY. 11/03/18  Yes Opalski, Gavin Poundeborah, DO  traZODone (DESYREL) 100 MG tablet 0.5-1 tablet p.o. nightly as needed sleep 02/18/18  Yes Opalski, Deborah, DO  Turmeric Curcumin 500 MG CAPS Take 1 capsule by mouth daily.   Yes [provider]    ROS:  Out of a complete 14 system review of symptoms, the patient complains only of the following  symptoms, and all other reviewed systems are negative.  Gait disorder Restless legs  Blood pressure 116/79, pulse 81, temperature 98 F (36.7 C), temperature source Temporal, height 5\' 7"  (1.702 m), weight 142 lb (64.4 kg).  Physical Exam  General: The patient is alert and cooperative at the time of the examination.  Skin: No significant peripheral edema is noted.   Neurologic Exam  Mental status: The patient is alert and oriented x 3 at the time of the examination. The patient has apparent normal recent and remote memory, with an apparently normal attention span and concentration ability.   Cranial nerves: Facial symmetry is present. Speech is normal, no aphasia or dysarthria is noted. Extraocular movements are full. Visual fields are full.  Motor: The patient has good strength in all 4 extremities.  Increased motor tone is seen in the legs.  Sensory examination: Soft touch sensation is symmetric on the face, arms, and legs.  Coordination: The patient has good finger-nose-finger bilaterally.  The patient has difficulty performing heel-to-shin on both sides.  Gait and station: The patient has a spastic, diplegic gait.  He is able to walk with a walker, he drags his feet forward on the toes.  Reflexes: Deep tendon reflexes are symmetric, but are elevated in the legs.   Assessment/Plan:  1.  Hereditary spastic paraparesis  2.  Restless leg syndrome  The patient will go back on his full dose of clonazepam which may help his restless legs and help some of the leg spasticity.  The patient will be given a trial on tizanidine taking 2 mg 3 times daily, he will call for any dose adjustments.  I have suggested a retrial for AFO bracing but he does not wish to consider this.  He will follow-up in 6 months.   Marlan Palau. Keith Kesley Mullens MD 03/17/2019 10:36 AM  [facility] [address] [place], [place] [id number]  Phone [phone] Fax [phone]

## 2019-04-02 ENCOUNTER — Telehealth: Payer: Self-pay | Admitting: Family Medicine

## 2019-04-02 NOTE — Telephone Encounter (Signed)
Patient notified that handicap plaqar ready for pick up.  -FYI to medical staff. -glh

## 2019-04-08 ENCOUNTER — Encounter: Payer: Self-pay | Admitting: Internal Medicine

## 2019-04-08 ENCOUNTER — Other Ambulatory Visit: Payer: Self-pay

## 2019-04-08 ENCOUNTER — Ambulatory Visit: Payer: No Typology Code available for payment source | Admitting: Internal Medicine

## 2019-04-08 DIAGNOSIS — K648 Other hemorrhoids: Secondary | ICD-10-CM

## 2019-04-08 NOTE — Patient Instructions (Signed)
HEMORRHOID BANDING PROCEDURE    FOLLOW-UP CARE   1. The procedure you have had should have been relatively painless since the banding of the area involved does not have nerve endings and there is no pain sensation.  The rubber band cuts off the blood supply to the hemorrhoid and the band may fall off as soon as 48 hours after the banding (the band may occasionally be seen in the toilet bowl following a bowel movement). You may notice a temporary feeling of fullness in the rectum which should respond adequately to plain Tylenol or Motrin.  2. Following the banding, avoid strenuous exercise that evening and resume full activity the next day.  A sitz bath (soaking in a warm tub) or bidet is soothing, and can be useful for cleansing the area after bowel movements.     3. To avoid constipation, take two tablespoons of natural wheat bran, natural oat bran, flax, Benefiber or any over the counter fiber supplement and increase your water intake to 7-8 glasses daily.    4. Unless you have been prescribed anorectal medication, do not put anything inside your rectum for two weeks: No suppositories, enemas, fingers, etc.  5. Occasionally, you may have more bleeding than usual after the banding procedure.  This is often from the untreated hemorrhoids rather than the treated one.  Don't be concerned if there is a tablespoon or so of blood.  If there is more blood than this, lie flat with your bottom higher than your head and apply an ice pack to the area. If the bleeding does not stop within a half an hour or if you feel faint, call our office at [phone] or go to the emergency room.  6. Problems are not common; however, if there is a substantial amount of bleeding, severe pain, chills, fever or difficulty passing urine (very rare) or other problems, you should call us at [phone] or report to the nearest emergency room.  7. Do not stay seated continuously for more than 2-3 hours for a day or two  after the procedure.  Tighten your buttock muscles 10-15 times every two hours and take 10-15 deep breaths every 1-2 hours.  Do not spend more than a few minutes on the toilet if you cannot empty your bowel; instead re-visit the toilet at a later time.    Follow up with Dr Gessner as needed.    I appreciate the opportunity to care for you. Carl Gessner, MD, FACG 

## 2019-04-08 NOTE — Progress Notes (Signed)
    HEMORRHOID LIGATION  Symptoms of prolapse and fecal seepage improved since banding of right posterior and left lateral on 03/10/2019  Interested in banding the remaining hemorrhoid column   Rectal exam no mass mild spasm nontender  PROCEDURE NOTE: The patient presents with symptomatic grade 2 hemorrhoids, requesting rubber band ligation of his/her hemorrhoidal disease.  All risks, benefits and alternative forms of therapy were described and informed consent was obtained.   The anorectum was pre-medicated with nitroglycerin 0.125% and lidocaine 5% The decision was made to band the right anterior internal hemorrhoid, and the [facility] was used to perform band ligation without complication.  Digital anorectal examination was then performed to assure proper positioning of the band, and to adjust the banded tissue as required.  The patient was discharged home without pain or other issues.  Dietary and behavioral recommendations were given and along with follow-up instructions.     The following adjunctive treatments were recommended:  Continue Benefiber 1 tablespoon daily which is helping constipation  The patient will return as needed for  follow-up and possible additional banding as required. No complications were encountered and the patient tolerated the procedure well.  I appreciate the opportunity to care for this patient. CC: Mellody Dance, DO

## 2019-04-08 NOTE — Assessment & Plan Note (Signed)
RA banded F/U prn 

## 2019-04-10 ENCOUNTER — Telehealth: Payer: Self-pay | Admitting: Neurology

## 2019-04-10 NOTE — Telephone Encounter (Signed)
Pt is asking for a call from RN when the office reopens next week to discuss him not taking the tizanidine due to the interaction with his baclofen.  Pt states he is only taking the baclofen (LIORESAL) 10 MG tablet please call

## 2019-04-14 ENCOUNTER — Telehealth: Payer: Self-pay | Admitting: Physical Medicine and Rehabilitation

## 2019-04-14 ENCOUNTER — Telehealth: Payer: Self-pay

## 2019-04-14 NOTE — Telephone Encounter (Signed)
Lvm asking pt to call me back.  

## 2019-04-14 NOTE — Telephone Encounter (Signed)
Patient stated that he was returning a call he missed from our office.

## 2019-04-15 ENCOUNTER — Encounter: Payer: Self-pay | Admitting: Family Medicine

## 2019-04-15 ENCOUNTER — Ambulatory Visit (INDEPENDENT_AMBULATORY_CARE_PROVIDER_SITE_OTHER): Payer: No Typology Code available for payment source | Admitting: Family Medicine

## 2019-04-15 DIAGNOSIS — M545 Low back pain, unspecified: Secondary | ICD-10-CM

## 2019-04-15 MED ORDER — METHYLPREDNISOLONE 4 MG PO TBPK: ORAL_TABLET | ORAL | 0 refills | Status: DC

## 2019-04-15 MED ORDER — TIZANIDINE HCL 2 MG PO TABS: 2.0000 mg | ORAL_TABLET | Freq: Four times a day (QID) | ORAL | 1 refills | Status: DC | PRN

## 2019-04-15 MED FILL — METHYLPREDNISOLONE 4 MG TAB: 4 | 6 days supply | Qty: 21 | Fill #0

## 2019-04-15 NOTE — Progress Notes (Signed)
Office Visit Note   Patient: Terry Howard           Date of Birth: [birth year]           MRN: [id number] Visit Date: 04/15/2019 Requested by: Mellody Dance, DO [place],  [place] [id number] PCP: Mellody Dance, DO  Subjective: Chief Complaint  Patient presents with  . Lower Back - Pain    Woke up 04/10/19 with pain in the left lower back. Has had pain in the past, but always on the right side. No new injury. Has had ESIs in the past with Dr. Ernestina Patches.    HPI: He is here for left-sided low back pain.  He woke up with pain earlier this month, no definite injury.  He has a longstanding history of right-sided low back problems which responded well to epidural steroid injections.  He has never had left-sided pain like this.  The pain does not radiate down the leg or into the groin area, and is located in the posterior hip.  He has tried Voltaren gel and ibuprofen with no significant improvement.  Denies bowel or bladder dysfunction, fevers or chills.  His last lumbar MRI scan was in 2012 at which time he had L2-3 disc bulging with left-sided L2 nerve contact.  He had right-sided L4-5 foraminal stenosis as well.              ROS:   All other systems were reviewed and are negative.  Objective: Vital Signs: There were no vitals taken for this visit.  Physical Exam:  General:  Alert and oriented, in no acute distress. Pulm:  Breathing unlabored. Psy:  Normal mood, congruent affect. Skin: No rash on the skin. Low back: No midline tenderness, mildly tender in the left L5-S1 paraspinous muscles.  Maximum tenderness is in the left gluteus medius region.  No significant pain over the SI joint.   Imaging: None today.  X-rays viewed on computer from April 24, 2017 reveal moderate to severe disc space narrowing at L2-3 with left-sided lateral spurring.    Assessment & Plan: 1.  Left low back/posterior hip pain, possibly muscular but cannot rule out pain from L2-3 disc  protrusion. -We will try Medrol Dosepak.  New MRI scan ordered followed by referral to Dr. Ernestina Patches.  If no indication for epidural injection, could try gluteus medius trigger point injection.     Procedures: No procedures performed  No notes on file     PMFS History: Patient Active Problem List   Diagnosis Date Noted  . Internal and external prolapsed hemorrhoids 03/10/2019  . Anal skin tags 03/10/2019  . Constipation 03/10/2019  . Pain in left elbow 12/10/2018  . Cubital tunnel syndrome on left 11/06/2018  . Gum hypertrophy 06/06/2018  . Gum inflammation 06/06/2018  . Disability of walking 06/06/2018  . 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 Insomnia 11/26/2017  . Habitual snoring 11/26/2017  . Mood disorder ([place]) 11/26/2017  . Chronic pansinusitis 09/25/2017  . Deviated septum 09/05/2017  . Hypertrophy, nasal, turbinate 09/05/2017  . Perennial allergic rhinitis 09/05/2017  . Impingement syndrome of left shoulder 04/29/2017  . Chronic left shoulder pain 04/22/2017  . Strain of right hip adductor muscle 02/05/2017  . Statin intolerance 12/13/2016  . Vitamin D insufficiency 06/21/2016  . Elevated TSH 06/21/2016  . External hemorrhoids 05/18/2016  . Benign prostatic hyperplasia  L>R; without lower urinary tract symptoms 05/18/2016  . Health education/counseling 02/25/2016  . Restless legs syndrome (RLS)  02/15/2016  . Erectile dysfunction 02/15/2016  . h/o CLinically Signficant Anxiety 01/05/2014  . h/o Clinical depression 12/31/2013  . Hyperlipidemia 12/18/2013  . Genital Herpes 11/18/2013  . Cannot sleep 11/18/2013  . Extremity pain 11/18/2013  . Abnormal findings on diagnostic imaging of abdomen 10/29/2013  . 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 Polypharmacy 08/18/2013  . Other long term (current) drug therapy 08/18/2013  . Chronic pain 08/11/2013  . Other specified disorders of urinary tract 07/09/2012  . Abnormality of gait  07/09/2012  . Lumbago 07/09/2012  . Disturbance of skin sensation 07/09/2012  . 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 Arthritis   . Chronic insomnia   . Chronic low back pain    Annular tear at the L2-3 and L4-5 levels 4  . Depression   . Gait disorder   . Hereditary spastic paraparesis (HCC)   . Hyperlipidemia   . Internal hemorrhoids   . 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 Neurogenic bladder   . RLS (restless legs syndrome)     Family History  Problem Relation Age of Onset  . Cancer Mother        breast cancer  . Other Mother   . Hyperlipidemia Mother   . Stroke Father        Intracranial hemorrhage  . Other Brother   . Other Brother   . Other Brother   . Colon cancer Neg Hx   . Colon polyps Neg Hx   . Esophageal cancer Neg Hx   . Stomach cancer Neg Hx   . Rectal cancer Neg Hx     Past Surgical History:  Procedure Laterality Date  . APPENDECTOMY    . BREAST SURGERY Bilateral    cysts  . COLONOSCOPY    . HEMORRHOID BANDING    . KNEE ARTHROSCOPY     right  . SHOULDER SURGERY     Rotator cuff, left  . ULNAR TUNNEL RELEASE Left 09/10/2014   Procedure: LEFT CUBITAL TUNNEL RELEASE;  Surgeon: Cheral AlmasNaiping Tangala Wiegert Xu, MD;  Location: [facility];  Service: Orthopedics;  Laterality: Left;   Social History   Occupational History  . Occupation: Unemployed    Comment: Disability  Tobacco Use  . Smoking status: Never Smoker  . Smokeless tobacco: Never Used  Substance and Sexual Activity  . Alcohol use: Yes    Alcohol/week: 7.0 standard drinks    Types: 7 Cans of beer per week    Comment: Consumes alcohol on occasion  . Drug use: No  . Sexual activity: Yes    Birth control/protection: Other-see comments    Comment: vasectomy

## 2019-04-15 NOTE — Telephone Encounter (Signed)
Lvm asking pt to call me back.  

## 2019-04-16 NOTE — Telephone Encounter (Signed)
Lvm asking pt to call me back.  

## 2019-04-21 ENCOUNTER — Telehealth: Payer: Self-pay | Admitting: Family Medicine

## 2019-04-21 NOTE — Telephone Encounter (Signed)
Patient called concerning getting scheduled for the MRI. The number to contact patient is [phone]

## 2019-04-22 NOTE — Telephone Encounter (Signed)
Left message that patient is approved through insurance. Appointment is [place]. 05/08/19 at [facility] at [address]

## 2019-05-08 ENCOUNTER — Telehealth: Payer: Self-pay | Admitting: Family Medicine

## 2019-05-08 ENCOUNTER — Ambulatory Visit
Admission: RE | Admit: 2019-05-08 | Discharge: 2019-05-08 | Disposition: A | Discharge status: Home / Self Care | Payer: No Typology Code available for payment source | Source: Ambulatory Visit | Attending: Family Medicine | Admitting: Family Medicine

## 2019-05-08 ENCOUNTER — Other Ambulatory Visit: Payer: Self-pay

## 2019-05-08 DIAGNOSIS — M545 Low back pain, unspecified: Secondary | ICD-10-CM

## 2019-05-08 NOTE — Telephone Encounter (Signed)
Lumbar MRI scan shows a left-sided L1-2 disc protrusion which could affect the left L1 and L2 nerve roots.  There is some bone spurring at L2-3 which narrows the left-sided nerve opening but does not cause nerve compression.

## 2019-05-11 NOTE — Telephone Encounter (Signed)
Closing encounter

## 2019-06-08 ENCOUNTER — Other Ambulatory Visit: Payer: Self-pay | Admitting: Family Medicine

## 2019-06-08 DIAGNOSIS — F39 Unspecified mood [affective] disorder: Secondary | ICD-10-CM

## 2019-06-08 DIAGNOSIS — G4700 Insomnia, unspecified: Secondary | ICD-10-CM

## 2019-06-09 ENCOUNTER — Telehealth: Payer: Self-pay | Admitting: Family Medicine

## 2019-06-09 MED FILL — traZODone HCL 100 MG TABS: 100 | 30 days supply | Qty: 30 | Fill #0

## 2019-06-09 MED FILL — rOPINIRole HCL 2 MG TABS: 2 | 60 days supply | Qty: 30 | Fill #0

## 2019-06-09 NOTE — Telephone Encounter (Signed)
This has been addressed via RX request from Pharmacy. AS, CMA

## 2019-06-09 NOTE — Telephone Encounter (Signed)
Patient is requesting a refill of his ropinirole and trazodone. If approved please send to [facility].

## 2019-06-15 ENCOUNTER — Ambulatory Visit (INDEPENDENT_AMBULATORY_CARE_PROVIDER_SITE_OTHER): Payer: No Typology Code available for payment source | Admitting: Family Medicine

## 2019-06-15 ENCOUNTER — Encounter: Payer: Self-pay | Admitting: Family Medicine

## 2019-06-15 ENCOUNTER — Other Ambulatory Visit: Payer: Self-pay

## 2019-06-15 VITALS — BP 136/84 | HR 63 | Temp 97.7°F | Resp 12 | Ht 67.0 in | Wt 128.0 lb

## 2019-06-15 DIAGNOSIS — G114 Hereditary spastic paraplegia: Secondary | ICD-10-CM | POA: Diagnosis not present

## 2019-06-15 DIAGNOSIS — Z23 Encounter for immunization: Secondary | ICD-10-CM | POA: Diagnosis not present

## 2019-06-15 DIAGNOSIS — F39 Unspecified mood [affective] disorder: Secondary | ICD-10-CM | POA: Diagnosis not present

## 2019-06-15 DIAGNOSIS — G2581 Restless legs syndrome: Secondary | ICD-10-CM | POA: Diagnosis not present

## 2019-06-15 DIAGNOSIS — Z789 Other specified health status: Secondary | ICD-10-CM

## 2019-06-15 DIAGNOSIS — F339 Major depressive disorder, recurrent, unspecified: Secondary | ICD-10-CM

## 2019-06-15 DIAGNOSIS — G4700 Insomnia, unspecified: Secondary | ICD-10-CM

## 2019-06-15 DIAGNOSIS — R262 Difficulty in walking, not elsewhere classified: Secondary | ICD-10-CM

## 2019-06-15 DIAGNOSIS — E785 Hyperlipidemia, unspecified: Secondary | ICD-10-CM

## 2019-06-15 DIAGNOSIS — R29818 Other symptoms and signs involving the nervous system: Secondary | ICD-10-CM

## 2019-06-15 MED ORDER — TRAZODONE HCL 100 MG PO TABS: ORAL_TABLET | ORAL | 1 refills | Status: DC

## 2019-06-15 NOTE — Progress Notes (Signed)
Impression and Recommendations:    1. Need for immunization against influenza   2. Hereditary spastic paraparesis ([place])   3. Mood disorder ([place])   4. Depression, recurrent ([place])   5. Restless legs syndrome (RLS)   6. Insomnia, unspecified type   7. Disability due to neurological disorder   8. Disability of walking   9. Hyperlipidemia, unspecified hyperlipidemia type   10. Statin intolerance     - Need for immunization against influenza   Mood disorder - Stable at this time.  - Continue management as established. Declines need for anything additional  At this time - Reviewed the "spokes of the wheel" of mood and health management.  Stressed the importance of ongoing prudent habits, including regular exercise, appropriate sleep hygiene, healthful dietary habits, and prayer/meditation to relax.  - Will continue to monitor.   Insomnia, unspecified type - Stable at this time. - Continue management as established. - Will continue to monitor.   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 - Per pt, continues on baclofen, but discontinued Zanaflex due to S-E of tiredness.  - Advised patient to obtain clonazepam from neurologist in future moving forward.  If neurologist feels that the klonopin is appropriate for the patient to take, given patient's muscle spasms/neurological condition, patient should continue to obtain klonopin from neurology.  - Discussed that clonazepam is a CNS depressant, and if patient needs to take this medication for his muscle spasms, he may continue.  However, reviewed that it could lead to exacerbated depression etc.    - Patient will consult with neurology regarding treatment plan as advised.  - Patient knows to continue to follow up with neurology as established.   RLS - Stable at this time. - Continue management as established. -  Will continue to  monitor.   Hyperlipidemia, Statin Intolerance - Last checked in June of 2020.  Need for re-check. - Reviewed that if cholesterol is still going up, discussed referral to cardiology in future to discuss options aside from statins.  - Will continue to monitor.   Lifestyle & Preventative Health Maintenance - Advised patient to continue working toward exercising to improve overall mental, physical, and emotional health.    - Advised patient to increase calories and protein to help maintain weight and muscle mass. - Encouraged patient to incorporate protein shakes to help supplement his caloric/protein intake.  - Encouraged patient to engage in daily physical activity, especially a formal exercise routine.  Recommended that the patient eventually strive for at least 150 minutes of moderate cardiovascular activity per week according to guidelines established by the [facility].   - Healthy dietary habits encouraged, including low-carb, and high amounts of lean protein in diet.   - Patient should also consume adequate amounts of water.  - Health counseling performed.  All questions answered.   Recommendations - Patient will call and let us know what his weight is running on scale at home.    Orders Placed This Encounter  Procedures   Flu Vaccine QUAD 6+ mos PF IM (Fluarix Quad PF)    Meds ordered this encounter  Medications   traZODone (DESYREL) 100 MG tablet    Sig: TAKE 1/2 TO 1 TABLET BY MOUTH NIGHTLY AS NEEDED FOR SLEEP    Dispense:  90 tablet    Refill:  1    Medications Discontinued During This Encounter  Medication Reason   methylPREDNISolone (MEDROL DOSEPAK) 4 MG TBPK tablet  Error   tiZANidine (ZANAFLEX) 2 MG tablet Error   traZODone (DESYREL) 100 MG tablet Reorder     Gross side effects, risk and benefits, and alternatives of medications and treatment plan in general discussed with patient.  Patient is aware that all medications have potential side effects and we are  unable to predict every side effect or drug-drug interaction that may occur.   Patient will call with any questions prior to using medication if they have concerns.    Expresses verbal understanding and consents to current therapy and treatment regimen.  No barriers to understanding were identified.  Red flag symptoms and signs discussed in detail.  Patient expressed understanding regarding what to do in case of emergency\urgent symptoms  Please see AVS handed out to patient at the end of our visit for further patient instructions/ counseling done pertaining to today's office visit.   Return for f/up 4 months. Pt will speak with neuro re: klonopin use.     Note:  This note was prepared with assistance of Dragon voice recognition software. Occasional wrong-word or sound-a-like substitutions may have occurred due to the inherent limitations of voice recognition software.   This document serves as a record of services personally performed by Thomasene Loteborah Yasemin Rabon, DO. It was created on her behalf by Peggye FothergillKatherine Galloway, a trained medical scribe. The creation of this record is based on the scribe's personal observations and the provider's statements to them.   This case required medical decision making of at least moderate complexity. The above documentation has been reviewed to be accurate and was completed by Carlye Grippeeborah J. Zeki Bedrosian, D.O.       --------------------------------------------------------------------------------------------------------------------------------------------------------------------------------------------------------------------------------------------    Subjective:    Terry Howard, Katherine Galloway, am serving as scribe for Dr. Thomasene Loteborah Takima [place].  HPI: Terry Howard is a 63 y.o. male [facility] issues as discussed below.  Says he's been "same old, same old."  Notes family is doing well.  - Body Weight, Muscle Mass Notes  he lost muscle mass but "I don't know why."  He has been eating protein for the last two years.  Says "the only problem [the protein] gave me was it was giving me gas, so I quit taking it for about three days."  Says he's shocked about weight loss per scale today because he still works out, but also notes "if I look in the mirror, [my lower body is] turning into stick and bones."  Feels his upper body is doing okay.  "I feel strong in the upper body, I don't know."  Says he wants to get back on the protein to keep weight up, but he's surprised he's lost so much weight.  He has a scale in the garage that says he weighs 142 lbs.  Notes as recently as Saturday his scale at home was showing 142.  Notes he doesn't want to lose more weight.  Says because he's getting slower he tries not to go out because he "gets in everybody's way."  Also notes that with COVID he's been avoiding going out.  - Neuromuscular Disability; Follows Up with Dr. Anne HahnWillis Says "nothing can be done."  He tried new foods and such, and notes there are other, more expensive options to help with his condition, but he does not wish to incorporate these.  Says "It takes me longer to get going, but I'm doing okay."  Says "if I get down, I have a harder time getting up,  pushing, pulling."  He keeps working out three times per week.  Patient notes about two months ago he discontinued the Zanaflex.  Notes while taking baclofen and Zanaflex, he felt "tired and blah" and "couldn't get up off the couch."  - Mood Thinks his mood has been okay.  Says "you can't complain; I'll just keep on as best I can."  He's still working in the yard, etc., "it takes me longer, but I will still get out there and do it."  Says "I've still got that energy."  - Klonopin Use Says that he takes a quarter tablet and night and a quarter tablet in the morning "and I have no problems."  Notes when he quit taking it, he felt a strange "numbness," noting a numbness  sensation in his teeth.  Says his neurologist told him he can take klonopin.  - Sleep Notes "it comes and goes."  Says he tends to wake up at 2 AM, and it's hard to go back to sleep, so he gets up for a little bit, watches TV, then gets back into bed to rest.  Notes he's had insomnia for 30+ years now.  - Acyclovir management Continues daily acyclovir.  Says "I take a half pill and it works fine."  Denies outbreaks.  When he was taking a whole tablet, he felt he was experiencing unwanted side-effects.  - Vitamin D Notes he takes multivitamins and Vitamin D.   Wt Readings from Last 3 Encounters:  06/15/19 128 lb (58.1 kg)  04/08/19 143 lb 4 oz (65 kg)  03/17/19 142 lb (64.4 kg)   BP Readings from Last 3 Encounters:  06/15/19 136/84  04/08/19 108/68  03/17/19 116/79   Pulse Readings from Last 3 Encounters:  06/15/19 63  04/08/19 80  03/17/19 81   BMI Readings from Last 3 Encounters:  06/15/19 20.05 kg/m  04/08/19 23.30 kg/m  03/17/19 22.24 kg/m     Patient Care Team    Relationship Specialty Notifications Start End  [name], DO PCP - General Family Medicine  02/15/16   Tyrell Antonio, MD Consulting Physician Physical Medicine and Rehabilitation  05/22/16   York Spaniel, MD Consulting Physician Neurology  05/22/16   Specialists, [facility]  Orthopedic Surgery  12/11/16   Suzanna Obey, MD Consulting Physician Otolaryngology  10/04/17   Iva Boop, MD Consulting Physician Gastroenterology  10/04/17   Drema Halon, MD Consulting Physician Otolaryngology  10/04/17   Ihor Gully, MD Consulting Physician Urology  10/04/17      Patient Active Problem List   Diagnosis Date Noted   Insomnia 11/26/2017    Priority: High   Mood disorder (HCC) 11/26/2017    Priority: High   Statin intolerance 12/13/2016    Priority: High   Restless legs syndrome (RLS) 02/15/2016    Priority: High   h/o CLinically Signficant Anxiety 01/05/2014     Priority: High   h/o Clinical depression 12/31/2013    Priority: High   Hyperlipidemia 12/18/2013    Priority: High   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    Priority: High   Elevated TSH 06/21/2016    Priority: Medium   Vitamin D insufficiency 06/21/2016    Priority: Low   Chronic pain 08/11/2013    Priority: Low   Abnormality of gait 07/09/2012    Priority: Low   Internal and external prolapsed hemorrhoids 03/10/2019   Anal skin tags 03/10/2019   Constipation 03/10/2019   Pain in left elbow 12/10/2018  Cubital tunnel syndrome on left 11/06/2018   Gum hypertrophy 06/06/2018   Gum inflammation 06/06/2018   Disability of walking 06/06/2018   Disability due to neurological disorder 06/06/2018   Habitual snoring 11/26/2017   Chronic pansinusitis 09/25/2017   Deviated septum 09/05/2017   Hypertrophy, nasal, turbinate 09/05/2017   Perennial allergic rhinitis 09/05/2017   Impingement syndrome of left shoulder 04/29/2017   Chronic left shoulder pain 04/22/2017   Strain of right hip adductor muscle 02/05/2017   External hemorrhoids 05/18/2016   Benign prostatic hyperplasia  L>R; without lower urinary tract symptoms 05/18/2016   Health education/counseling 02/25/2016   Erectile dysfunction 02/15/2016   Genital Herpes 11/18/2013   Cannot sleep 11/18/2013   Extremity pain 11/18/2013   Abnormal findings on diagnostic imaging of abdomen 10/29/2013   Polypharmacy 08/18/2013   Other long term (current) drug therapy 08/18/2013   Other specified disorders of urinary tract 07/09/2012   Lumbago 07/09/2012   Disturbance of skin sensation 07/09/2012   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    Current Meds  Medication Sig   acyclovir (ZOVIRAX) 800 MG  tablet TAKE 1 TABLET BY MOUTH DAILY.   baclofen (LIORESAL) 10 MG tablet Take 2 tablets (20 mg total) by mouth at bedtime.   Cholecalciferol (VITAMIN D3) 5000 units TABS 5,000 IU OTC vitamin D3 daily.   clonazePAM (KLONOPIN) 1 MG tablet Take 1 tablet (1 mg total) by mouth at bedtime.   Diclofenac Sodium 1.5 % SOLN APPLY 2-4 GRAMS TO AFFECTED AREA BID   rOPINIRole (REQUIP) 2 MG tablet TAKE 1/2 TABLET BY MOUTH DAILY.   traZODone (DESYREL) 100 MG tablet TAKE 1/2 TO 1 TABLET BY MOUTH NIGHTLY AS NEEDED FOR SLEEP   Turmeric Curcumin 500 MG CAPS Take 1 capsule by mouth daily.   [DISCONTINUED] traZODone (DESYREL) 100 MG tablet TAKE 1/2 TO 1 TABLET BY MOUTH NIGHTLY AS NEEDED FOR SLEEP    Allergies:  Allergies  Allergen Reactions   Prozac [Fluoxetine Hcl]     tolerate well due to SE -felt sluggish, tired, and anxious    Pregabalin Other (See Comments)    Intolerance, somnolence   Statins Other (See Comments)    Other reaction(s): Muscle Pain Other reaction(s): Muscle Pain Other reaction(s): Muscle Pain      Review of Systems:  A fourteen system review of systems was performed and found to be positive as per HPI.   Objective:   Blood pressure 136/84, pulse 63, temperature 97.7 F (36.5 C), temperature source Axillary, resp. rate 12, height  (1.702 m), weight 128 lb (58.1 kg), SpO2 100 %. Body mass index is 20.05 kg/m. General:  Well Developed, well nourished, appropriate for stated age.  Neuro:  Alert and oriented,  extra-ocular muscles intact  HEENT:  Normocephalic, atraumatic, neck supple, no carotid bruits appreciated  Skin:  no gross rash, warm, pink. Cardiac:  RRR, S1 S2 Respiratory:  ECTA B/L and A/P, Not using accessory muscles, speaking in full sentences- unlabored. Vascular:  Ext warm, no cyanosis apprec.; cap RF less 2 sec. Psych:  No HI/SI, judgement and insight good, Euthymic mood. Full Affect.

## 2019-06-18 ENCOUNTER — Encounter: Payer: Self-pay | Admitting: Neurology

## 2019-06-18 ENCOUNTER — Telehealth: Payer: Self-pay | Admitting: Neurology

## 2019-06-18 DIAGNOSIS — F339 Major depressive disorder, recurrent, unspecified: Secondary | ICD-10-CM

## 2019-06-18 DIAGNOSIS — G4700 Insomnia, unspecified: Secondary | ICD-10-CM

## 2019-06-18 MED ORDER — GABAPENTIN 300 MG PO CAPS: 300.0000 mg | ORAL_CAPSULE | Freq: Every day | ORAL | 3 refills | Status: DC

## 2019-06-18 MED ORDER — CLONAZEPAM 1 MG PO TABS: 1.0000 mg | ORAL_TABLET | Freq: Every day | ORAL | 1 refills | Status: DC

## 2019-06-18 MED FILL — clonazePAM 1 MG TABS: 1 | 90 days supply | Qty: 90 | Fill #0

## 2019-06-18 MED FILL — GABAPENTIN 300 MG CAPSULE: 300 | 30 days supply | Qty: 30 | Fill #0

## 2019-06-18 NOTE — Telephone Encounter (Signed)
I called the patient.  Patient still has some troubles with restless legs type symptoms, there could be a component of spasticity.  He cannot take tizanidine, he stopped this medication.  The test and remained too out of it the next day.  The patient will be given a prescription for gabapentin in the evening, he will be given a prescription for the clonazepam as a refill.

## 2019-06-18 NOTE — Telephone Encounter (Signed)
Pt called wanting to be advised on what else can be done for his RLS because the medications are not helping him.

## 2019-07-17 MED FILL — GABAPENTIN 300 MG CAPSULE: 300 | 30 days supply | Qty: 30 | Fill #1

## 2019-07-17 MED FILL — traZODone HCL 100 MG TABS: 100 | 90 days supply | Qty: 90 | Fill #0

## 2019-08-03 ENCOUNTER — Other Ambulatory Visit: Payer: Self-pay | Admitting: Neurology

## 2019-08-03 ENCOUNTER — Other Ambulatory Visit: Payer: Self-pay | Admitting: Family Medicine

## 2019-08-03 MED FILL — rOPINIRole HCL 2 MG TABS: 2 | 60 days supply | Qty: 30 | Fill #0

## 2019-08-03 MED FILL — BACLOFEN 10 MG TABS: 10 | 90 days supply | Qty: 180 | Fill #0

## 2019-08-21 MED FILL — GABAPENTIN 300 MG CAPSULE: 300 | 30 days supply | Qty: 30 | Fill #2

## 2019-10-02 MED FILL — GABAPENTIN 300 MG CAPSULE: 300 | 30 days supply | Qty: 30 | Fill #3

## 2019-10-12 ENCOUNTER — Other Ambulatory Visit: Payer: Self-pay | Admitting: Family Medicine

## 2019-10-12 MED FILL — rOPINIRole HCL 2 MG TABS: 2 | 60 days supply | Qty: 30 | Fill #0

## 2019-10-14 ENCOUNTER — Ambulatory Visit: Payer: No Typology Code available for payment source | Admitting: Family Medicine

## 2019-11-06 ENCOUNTER — Other Ambulatory Visit: Payer: Self-pay | Admitting: Neurology

## 2019-11-09 ENCOUNTER — Encounter: Payer: Self-pay | Admitting: Family Medicine

## 2019-11-09 ENCOUNTER — Ambulatory Visit (INDEPENDENT_AMBULATORY_CARE_PROVIDER_SITE_OTHER): Payer: No Typology Code available for payment source | Admitting: Family Medicine

## 2019-11-09 ENCOUNTER — Other Ambulatory Visit: Payer: Self-pay | Admitting: Family Medicine

## 2019-11-09 ENCOUNTER — Other Ambulatory Visit: Payer: Self-pay

## 2019-11-09 VITALS — BP 123/67 | HR 74 | Temp 98.0°F | Ht 67.0 in | Wt 153.4 lb

## 2019-11-09 DIAGNOSIS — E785 Hyperlipidemia, unspecified: Secondary | ICD-10-CM | POA: Diagnosis not present

## 2019-11-09 DIAGNOSIS — Z789 Other specified health status: Secondary | ICD-10-CM | POA: Diagnosis not present

## 2019-11-09 DIAGNOSIS — F39 Unspecified mood [affective] disorder: Secondary | ICD-10-CM

## 2019-11-09 DIAGNOSIS — F339 Major depressive disorder, recurrent, unspecified: Secondary | ICD-10-CM | POA: Diagnosis not present

## 2019-11-09 DIAGNOSIS — G114 Hereditary spastic paraplegia: Secondary | ICD-10-CM

## 2019-11-09 DIAGNOSIS — B009 Herpesviral infection, unspecified: Secondary | ICD-10-CM

## 2019-11-09 DIAGNOSIS — G4700 Insomnia, unspecified: Secondary | ICD-10-CM

## 2019-11-09 DIAGNOSIS — G2581 Restless legs syndrome: Secondary | ICD-10-CM

## 2019-11-09 MED ORDER — TRAZODONE HCL 100 MG PO TABS: ORAL_TABLET | ORAL | 1 refills | Status: DC

## 2019-11-09 MED ORDER — ROPINIROLE HCL 2 MG PO TABS: 1.0000 mg | ORAL_TABLET | Freq: Every day | ORAL | 1 refills | Status: DC

## 2019-11-09 MED ORDER — ACYCLOVIR 800 MG PO TABS: 800.0000 mg | ORAL_TABLET | Freq: Every day | ORAL | 1 refills | Status: DC

## 2019-11-09 MED FILL — ACYCLOVIR 800 MG TABLET: 800 | 90 days supply | Qty: 90 | Fill #0

## 2019-11-09 MED FILL — traZODone HCL 100 MG TABS: 100 | 90 days supply | Qty: 90 | Fill #0

## 2019-11-09 MED FILL — GABAPENTIN 300 MG CAPSULE: 300 | 30 days supply | Qty: 30 | Fill #0

## 2019-11-09 NOTE — Progress Notes (Signed)
Impression and Recommendations:    1. Hyperlipidemia, unspecified hyperlipidemia type   2. Statin intolerance   3. Mood disorder (Terry Howard)   4. Depression, recurrent (Terry Howard)   5. Hereditary spastic paraparesis (Terry Howard)   6. Restless legs syndrome (RLS)   7. Insomnia, unspecified type   8. Genital Herpes     - Per patient, obtained his colonoscopy January of last year.  Safety while Driving - Hereditary spastic paraparesis - Advised patient to avoid driving until he can obtain appropriate accommodations. - Encouraged patient to call his medical insurance, and discuss his options for accomodation coverage with Dr. Jannifer Howard of neurology since he is primary provider treating pt's N-M d/o.   - Will continue to monitor alongside specialist.   Hyperlipidemia, unspecified hyperlipidemia type - Statin Intolerance - LDL elevated to 142 last check, up from 124 prior. - Discussed need for re-check.  The 10-year ASCVD risk score Terry Bussing DC Jr., et al., 2013) is: 10.9%  - Reviewed that given his intolerance to statins, patient would be referred to cardiology for specialized treatment plan and ongoing management.  - Prudent dietary changes such as low saturated & trans fat diets for hyperlipidemia and low carb diets for hypertriglyceridemia discussed with patient extensively.  - If not at goal upon reck in one mo when pt is due for his annual, will have to start trial of non-statin meds  - We will continue to monitor.   Insomnia - Stable at this time on current management. - Trazodone working well - Continue treatment plan as established.  See med list.   Spasticity: - Per patient, clonazepam prescription (which he uses nightly) is managed by neurology. - Will continue to monitor alongside neurology.   Restless Legs Syndrome - Stable at this time on requip - Continue management as established.  See med list.  - Will continue to monitor alongside neurology.   Mood disorder,  Depression, recurrent - Stable - trazodone works well and per pt, helps with mood.  Denies need for another med for this - Continue treatment plan as established.  - Will continue to monitor.   Meds ordered this encounter  Medications  . traZODone (DESYREL) 100 MG tablet    Sig: TAKE 1 TABLET BY MOUTH NIGHTLY AS NEEDED FOR SLEEP    Dispense:  90 tablet    Refill:  1  . acyclovir (ZOVIRAX) 800 MG tablet    Sig: Take 1 tablet (800 mg total) by mouth daily.    Dispense:  90 tablet    Refill:  1  . rOPINIRole (REQUIP) 2 MG tablet    Sig: Take 0.5 tablets (1 mg total) by mouth daily. **PATIENT NEEDS APT FOR FURTHER REFILLS**    Dispense:  90 tablet    Refill:  1    Medications Discontinued During This Encounter  Medication Reason  . acyclovir (ZOVIRAX) 800 MG tablet Reorder  . traZODone (DESYREL) 100 MG tablet Reorder  . rOPINIRole (REQUIP) 2 MG tablet Reorder      Please see AVS handed out to patient at the end of our visit for further patient instructions/ counseling done pertaining to today's office visit.   Return for FLP in one month lab only, chronic f/up in 4 months.     Note:  This note was prepared with assistance of Dragon voice recognition software. Occasional wrong-word or sound-a-like substitutions may have occurred due to the inherent limitations of voice recognition software.   The 21st Century Cures Act was signed  into law in 2016 which includes the topic of electronic health records.  This provides immediate access to information in MyChart.  This includes consultation notes, operative notes, office notes, lab results and pathology reports.  If you have any questions about what you read please let us know at your next visit or call us at the office.  We are right here with you.   This case required medical decision making of at least moderate complexity.  This document serves as a record of services personally performed by Terry Dance, DO. It was created  on her behalf by Terry Howard, a trained medical scribe. The creation of this record is based on the scribe's personal observations and the provider's statements to them.    The above documentation from Terry Howard, medical scribe, has been reviewed by Terry Howard, D.O.      --------------------------------------------------------------------------------------------------------------------------------------------------------------------------------------------------------------------------------------------    Subjective:     Terry Howard, am serving as scribe for Dr.Mattia Howard.   HPI: Terry Howard is a 64 y.o. male who presents to Terry Howard at [facility] today for issues as discussed below.  Patient is stable at this time, denies complaints overall.  - Management on clonazepam He spoke with his neurologist regarding the clonazepam, and notes "he takes care of me now with that."  - Safety / Mobility States that he's doing ok.  Not driving as much lately.  When asked if it's safe for him to drive, the patient states he feels "it's 50/50."  Notes his upper body is still strong.  He is aware of his limitations and does all he can to prevent injury due to falling/tripping/etc.  Both of his brothers are in wheelchairs.  - COVID-19 Vaccine Status He has obtained his first dose of COVID-19 vaccine and receives the second dose on the 14th of this month.  States that he had a little bit of soreness for a day and a half.  - RLS With his legs, states "they still act up."  He feels the Requip does help him sometimes, but "it is what it is; it's crazy stuff to control."  He's unsure whether they will get worse as he ages, and what he's going to be able to take next.  He only uses the klonopin at night.  - Insomnia Continues taking trazodone at night.  Says he wakes up sometimes, "mostly because my leg's going crazy."  HPI:   Hyperlipidemia:  64 y.o. male here for cholesterol follow-up.   Has been trying "an herb" to manage his cholesterol for the past month.  - He denies new onset of: myalgias, arthralgias, increased fatigue more than normal, chest pains, exercise intolerance, shortness of breath, dizziness, visual changes, headache, lower extremity swelling or claudication.   Most recent cholesterol panel was:  Lab Results  Component Value Date   CHOL 209 (H) 01/19/2019   HDL 48 01/19/2019   LDLCALC 142 (H) 01/19/2019   TRIG 93 01/19/2019   CHOLHDL 4.4 01/19/2019   Hepatic Function Latest Ref Rng & Units 01/19/2019 06/11/2017 05/16/2016  Total Protein 6.0 - 8.5 g/dL 6.4 6.5 6.5  Albumin 3.8 - 4.8 g/dL 4.2 4.3 4.0  AST 0 - 40 IU/L '24 23 24  ' ALT 0 - 44 IU/L '28 25 27  ' Alk Phosphatase 39 - 117 IU/L 106 97 78  Total Bilirubin 0.0 - 1.2 mg/dL 0.5 0.6 0.6      Depression screen [facility] 2/9 11/09/2019 06/15/2019 02/03/2019  Decreased Interest 0  1 0  Down, Depressed, Hopeless 0 0 0  PHQ - 2 Score 0 1 0  Altered sleeping 0 1 0  Tired, decreased energy 0 1 0  Change in appetite 0 1 0  Feeling bad or failure about yourself  0 0 0  Trouble concentrating 0 0 0  Moving slowly or fidgety/restless 0 0 0  Suicidal thoughts 0 0 0  PHQ-9 Score 0 4 0  Difficult doing work/chores - Not difficult at all Not difficult at all  Some recent data might be hidden   GAD 7 : Generalized Anxiety Score 12/25/2018  Nervous, Anxious, on Edge 2  Control/stop worrying 2  Worry too much - different things 1  Trouble relaxing 1  Restless 1  Easily annoyed or irritable 2  Afraid - awful might happen 2  Total GAD 7 Score 11  Anxiety Difficulty Very difficult       Wt Readings from Last 3 Encounters:  11/09/19 153 lb 6.4 oz (69.6 kg)  06/15/19 128 lb (58.1 kg)  04/08/19 143 lb 4 oz (65 kg)   BP Readings from Last 3 Encounters:  11/09/19 123/67  06/15/19 136/84  04/08/19 108/68   Pulse Readings from Last 3 Encounters:   11/09/19 74  06/15/19 63  04/08/19 80   BMI Readings from Last 3 Encounters:  11/09/19 24.03 kg/m  06/15/19 20.05 kg/m  04/08/19 23.30 kg/m     Patient Care Team    Relationship Specialty Notifications Start End  Terry Dance, DO PCP - General Family Medicine  02/15/16   Magnus Sinning, MD Consulting Physician Physical Medicine and Rehabilitation  05/22/16   Kathrynn Ducking, MD Consulting Physician Neurology  05/22/16   Specialists, [facility]  Orthopedic Surgery  12/11/16   Melissa Montane, MD Consulting Physician Otolaryngology  10/04/17   Gatha Mayer, MD Consulting Physician Gastroenterology  10/04/17   Rozetta Nunnery, MD Consulting Physician Otolaryngology  10/04/17   Kathie Rhodes, MD Consulting Physician Urology  10/04/17      Patient Active Problem List   Diagnosis Date Noted  . Insomnia 11/26/2017  . Mood disorder ([place]) 11/26/2017  . Statin intolerance 12/13/2016  . Restless legs syndrome (RLS) 02/15/2016  . h/o CLinically Signficant Anxiety 01/05/2014  . h/o Clinical depression 12/31/2013  . Hyperlipidemia 12/18/2013  . Hereditary spastic paraparesis ([place]) 10/28/2013  . Elevated TSH 06/21/2016  . Vitamin D insufficiency 06/21/2016  . Chronic pain 08/11/2013  . Abnormality of gait 07/09/2012  . Internal and external prolapsed hemorrhoids 03/10/2019  . Anal skin tags 03/10/2019  . Constipation 03/10/2019  . Pain in left elbow 12/10/2018  . Cubital tunnel syndrome on left 11/06/2018  . Gum hypertrophy 06/06/2018  . Gum inflammation 06/06/2018  . Disability of walking 06/06/2018  . 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 Habitual snoring 11/26/2017  . Chronic pansinusitis 09/25/2017  . Deviated septum 09/05/2017  . Hypertrophy, nasal, turbinate 09/05/2017  . Perennial allergic rhinitis 09/05/2017  . Impingement syndrome of left shoulder 04/29/2017  . Chronic left shoulder pain 04/22/2017  . Strain of right hip adductor  muscle 02/05/2017  . External hemorrhoids 05/18/2016  . Benign prostatic hyperplasia  L>R; without lower urinary tract symptoms 05/18/2016  . Health education/counseling 02/25/2016  . Erectile dysfunction 02/15/2016  . Genital Herpes 11/18/2013  . Cannot sleep 11/18/2013  . Extremity pain 11/18/2013  . Abnormal findings on diagnostic imaging of abdomen 10/29/2013  . Polypharmacy 08/18/2013  . Other long term (current) drug therapy  08/18/2013  . Other specified disorders of urinary tract 07/09/2012  . Lumbago 07/09/2012  . Disturbance of skin sensation 07/09/2012  . 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    Current Meds  Medication Sig  . acyclovir (ZOVIRAX) 800 MG tablet Take 1 tablet (800 mg total) by mouth daily.  . baclofen (LIORESAL) 10 MG tablet TAKE 2 TABLETS BY MOUTH AT BEDTIME.  Marland Kitchen Cholecalciferol (VITAMIN D3) 5000 units TABS 5,000 IU OTC vitamin D3 daily.  Marland Kitchen CITRUS BERGAMOT PO Take 1,000 mg by mouth daily.  . clonazePAM (KLONOPIN) 1 MG tablet Take 1 tablet (1 mg total) by mouth at bedtime.  . Diclofenac Sodium 1.5 % SOLN APPLY 2-4 GRAMS TO AFFECTED AREA BID  . gabapentin (NEURONTIN) 300 MG capsule TAKE 1 CAPSULE (300 MG TOTAL) BY MOUTH AT BEDTIME.  Marland Kitchen rOPINIRole (REQUIP) 2 MG tablet Take 0.5 tablets (1 mg total) by mouth daily. **PATIENT NEEDS APT FOR FURTHER REFILLS**  . traZODone (DESYREL) 100 MG tablet TAKE 1 TABLET BY MOUTH NIGHTLY AS NEEDED FOR SLEEP  . Turmeric Curcumin 500 MG CAPS Take 1 capsule by mouth daily.  . [DISCONTINUED] acyclovir (ZOVIRAX) 800 MG tablet TAKE 1 TABLET BY MOUTH DAILY.  . [DISCONTINUED] rOPINIRole (REQUIP) 2 MG tablet Take 0.5 tablets (1 mg total) by mouth daily. **PATIENT NEEDS APT FOR FURTHER REFILLS**  . [DISCONTINUED] traZODone (DESYREL) 100 MG tablet TAKE 1/2 TO  1 TABLET BY MOUTH NIGHTLY AS NEEDED FOR SLEEP    Allergies:  Allergies  Allergen Reactions  . Prozac [Fluoxetine Hcl]     tolerate well due to SE -felt sluggish, tired, and anxious   . Pregabalin Other (See Comments)    Intolerance, somnolence  . Statins Other (See Comments)    Other reaction(s): Muscle Pain Other reaction(s): Muscle Pain Other reaction(s): Muscle Pain      Review of Systems:  A fourteen system review of systems was performed and found to be positive as per HPI.   Objective:   Blood pressure 123/67, pulse 74, temperature 98 F (36.7 C), temperature source Oral, height '5\' 7"'  (1.702 m), weight 153 lb 6.4 oz (69.6 kg), SpO2 99 %. Body mass index is 24.03 kg/m. General:  Well Developed, well nourished, appropriate for stated age.  Neuro:  Alert and oriented,  extra-ocular muscles intact  HEENT:  Normocephalic, atraumatic, neck supple, no carotid bruits appreciated  Skin:  no gross rash, warm, pink. Cardiac:  RRR, S1 S2 Respiratory:  ECTA B/L and A/P, Not using accessory muscles, speaking in full sentences- unlabored. Vascular:  Ext warm, no cyanosis apprec.; cap RF less 2 sec. Psych:  No HI/SI, judgement and insight good, Euthymic mood. Full Affect.

## 2019-11-09 NOTE — Addendum Note (Signed)
Addended by: [name] on: 11/09/2019 09:00 PM   Modules accepted: Level of Service

## 2019-11-09 NOTE — Patient Instructions (Signed)

## 2019-11-23 ENCOUNTER — Telehealth: Payer: Self-pay | Admitting: Physical Medicine and Rehabilitation

## 2019-11-23 NOTE — Telephone Encounter (Signed)
Patient requests an injection for left sided lower back pain. Pain does not radiate into leg. He has had a recent MRI. Please advise.

## 2019-11-23 NOTE — Telephone Encounter (Signed)
Appt 12/14/19 with driver.

## 2019-11-23 NOTE — Telephone Encounter (Signed)
Left 1 tf esi

## 2019-12-08 ENCOUNTER — Other Ambulatory Visit: Payer: Self-pay | Admitting: Physician Assistant

## 2019-12-08 DIAGNOSIS — E785 Hyperlipidemia, unspecified: Secondary | ICD-10-CM

## 2019-12-08 NOTE — Progress Notes (Signed)
Per patient last OV 11/2019:Return for FLP in one month lab only, chronic f/up in 4 months.

## 2019-12-09 ENCOUNTER — Other Ambulatory Visit: Payer: Self-pay

## 2019-12-09 ENCOUNTER — Other Ambulatory Visit: Payer: No Typology Code available for payment source

## 2019-12-09 DIAGNOSIS — E785 Hyperlipidemia, unspecified: Secondary | ICD-10-CM

## 2019-12-10 LAB — LIPID PANEL
Chol/HDL Ratio: 5.8 ratio — ABNORMAL HIGH (ref 0.0–5.0)
Cholesterol, Total: 225 mg/dL — ABNORMAL HIGH (ref 100–199)
HDL: 39 mg/dL — ABNORMAL LOW (ref >39)
LDL Chol Calc (NIH): 165 mg/dL — ABNORMAL HIGH (ref 0–99)
Triglycerides: 118 mg/dL (ref 0–149)
VLDL Cholesterol Cal: 21 mg/dL (ref 5–40)

## 2019-12-14 ENCOUNTER — Ambulatory Visit: Payer: Self-pay

## 2019-12-14 ENCOUNTER — Ambulatory Visit: Payer: No Typology Code available for payment source | Admitting: Physical Medicine and Rehabilitation

## 2019-12-14 ENCOUNTER — Other Ambulatory Visit: Payer: Self-pay

## 2019-12-14 ENCOUNTER — Other Ambulatory Visit: Payer: Self-pay | Admitting: Family Medicine

## 2019-12-14 ENCOUNTER — Encounter: Payer: Self-pay | Admitting: Physical Medicine and Rehabilitation

## 2019-12-14 VITALS — BP 117/70 | HR 64

## 2019-12-14 DIAGNOSIS — M5416 Radiculopathy, lumbar region: Secondary | ICD-10-CM | POA: Diagnosis not present

## 2019-12-14 DIAGNOSIS — M5116 Intervertebral disc disorders with radiculopathy, lumbar region: Secondary | ICD-10-CM

## 2019-12-14 DIAGNOSIS — G2581 Restless legs syndrome: Secondary | ICD-10-CM

## 2019-12-14 MED ORDER — DEXAMETHASONE SODIUM PHOSPHATE 10 MG/ML IJ SOLN
15.0000 mg | Freq: Once | INTRAMUSCULAR | Status: AC
Start: 2019-12-14 — End: 2019-12-14
  Administered 2019-12-14: 15 mg

## 2019-12-14 NOTE — Progress Notes (Signed)
Pt states pain in the left side of the lower back and sometimes radiates into both thighs. Pt states pain started 3 weeks ago. Walking and standing makes pain worse. Nothing helps with pain. Pt did state last injection 09/11/17 helped a lot and lasted for a couple of years.  .Numeric Pain Rating Scale and Functional Assessment Average Pain 6   In the last MONTH (on 0-10 scale) has pain interfered with the following?  1. General activity like being  able to carry out your everyday physical activities such as walking, climbing stairs, carrying groceries, or moving a chair?  Rating(7)   +Driver, -BT, -Dye Allergies.

## 2019-12-15 NOTE — Progress Notes (Signed)
Terry Howard - 64 y.o. male MRN [id number]  Date of birth: Dec 25, 1955  Office Visit Note: Visit Date: 12/14/2019 PCP: Lorrene Reid, PA-C Referred by: Mellody Dance, DO  Subjective: Chief Complaint  Patient presents with  . Lower Back - Pain  . Right Thigh - Pain  . Left Thigh - Pain   HPI: Terry Howard is a 64 y.o. male who comes in today for planned Left L1-L2 lumbar transforaminal epidural steroid injection with fluoroscopic guidance.  The patient has failed conservative care including home exercise, medications, time and activity modification.  This injection will be diagnostic and hopefully therapeutic.  Please see requesting physician notes for further details and justification.  Patient is having left-sided upper lumbar back pain worsening over the last several months.  He has had updated MRI since have seen him and this is reviewed with the patient and reviewed below.  It does show disc osteophyte at L1-2 impacting the lateral recess.   ROS Otherwise per HPI.  Assessment & Plan: Visit Diagnoses:  1. Lumbar radiculopathy   2. Radiculopathy due to lumbar intervertebral disc disorder     Plan: No additional findings.   Meds & Orders:  Meds ordered this encounter  Medications  . dexamethasone (DECADRON) injection 15 mg    Orders Placed This Encounter  Procedures  . XR C-ARM NO REPORT  . Epidural Steroid injection    Follow-up: Return if symptoms worsen or fail to improve.   Procedures: No procedures performed  Lumbosacral Transforaminal Epidural Steroid Injection - Sub-Pedicular Approach with Fluoroscopic Guidance  Patient: Terry Howard      Date of Birth: [birth year] MRN: [id number] PCP: Lorrene Reid, PA-C      Visit Date: 12/14/2019   Universal Protocol:    Date/Time: 12/14/2019  Consent Given By: the patient  Position: PRONE  Additional Comments: Vital signs were monitored before and after the procedure. Patient was prepped and  draped in the usual sterile fashion. The correct patient, procedure, and site was verified.   Injection Procedure Details:  Procedure Site One Meds Administered:  Meds ordered this encounter  Medications  . dexamethasone (DECADRON) injection 15 mg    Laterality: Left  Location/Site:  L1-L2  Needle size: 22 G  Needle type: Spinal  Needle Placement: Transforaminal  Findings:    -Comments: Excellent flow of contrast along the nerve and into the epidural space.  Procedure Details: After squaring off the end-plates to get a true AP view, the C-arm was positioned so that an oblique view of the foramen as noted above was visualized. The target area is just inferior to the "nose of the scotty dog" or sub pedicular. The soft tissues overlying this structure were infiltrated with 2-3 ml. of 1% Lidocaine without Epinephrine.  The spinal needle was inserted toward the target using a "trajectory" view along the fluoroscope beam.  Under AP and lateral visualization, the needle was advanced so it did not puncture dura and was located close the 6 O'Clock position of the pedical in AP tracterory. Biplanar projections were used to confirm position. Aspiration was confirmed to be negative for CSF and/or blood. A 1-2 ml. volume of Isovue-250 was injected and flow of contrast was noted at each level. Radiographs were obtained for documentation purposes.   After attaining the desired flow of contrast documented above, a 0.5 to 1.0 ml test dose of 0.25% Marcaine was injected into each respective transforaminal space.  The patient was observed for 90 seconds post injection.  After no sensory deficits were reported, and normal lower extremity motor function was noted,   the above injectate was administered so that equal amounts of the injectate were placed at each foramen (level) into the transforaminal epidural space.   Additional Comments:  The patient tolerated the procedure well Dressing: 2 x 2  sterile gauze and Band-Aid    Post-procedure details: Patient was observed during the procedure. Post-procedure instructions were reviewed.  Patient left the clinic in stable condition.      Clinical History: CLINICAL DATA:  Left-sided low back pain.  Bilateral leg weakness.  EXAM: MRI LUMBAR SPINE WITHOUT CONTRAST  TECHNIQUE: Multiplanar, multisequence MR imaging of the lumbar spine was performed. No intravenous contrast was administered.  COMPARISON:  None.  FINDINGS: Segmentation:  Normal  Alignment:  Mild scoliosis.  Normal sagittal alignment.  Vertebrae:  Negative for fracture or mass.  Conus medullaris and cauda equina: Conus extends to the L1-2 level. Conus and cauda equina appear normal.  Paraspinal and other soft tissues: Negative for paraspinous mass or adenopathy.  Disc levels:  L1-2: Left foraminal disc protrusion and associated spurring extending lateral to the foramen. This could affect the left L1 and L2 nerve roots. Spinal canal adequate in size  L2-3: Asymmetric disc degeneration on the left with disc space narrowing and spurring. Mild subarticular and foraminal stenosis on the left due to spurring.  L3-4: Disc bulging and endplate spurring. Mild facet degeneration. Negative for stenosis  L4-5: Asymmetric disc degeneration on the right with disc space narrowing and spurring. Moderate subarticular and foraminal stenosis on the right. Spinal canal adequate in size  L5-S1: Normal disc space. Bilateral facet hypertrophy without significant stenosis.  IMPRESSION: Left foraminal and extraforaminal disc protrusion L1-2 with associated spurring. This could affect left L2 and L1 nerve roots.  Mild subarticular and foraminal stenosis on the left at L2-3 due to spurring  Moderate subarticular foraminal stenosis on the right at L4-5 due to spurring.   Electronically Signed   By: Marlan Palau M.D.   On: 05/08/2019 10:52    He reports that he has never smoked. He has never used smokeless tobacco.  Recent Labs    01/19/19 1012  HGBA1C 4.8    Objective:  VS:  HT:    WT:   BMI:     BP:117/70  HR:64bpm  TEMP: ( )  RESP:  Physical Exam  Ortho Exam  Imaging: XR C-ARM NO REPORT  Result Date: 12/14/2019 Please see Notes tab for imaging impression.   Past Medical/Family/Surgical/Social History: Medications & Allergies reviewed per EMR, new medications updated. Patient Active Problem List   Diagnosis Date Noted  . Internal and external prolapsed hemorrhoids 03/10/2019  . Anal skin tags 03/10/2019  . Constipation 03/10/2019  . Pain in left elbow 12/10/2018  . Cubital tunnel syndrome on left 11/06/2018  . Gum hypertrophy 06/06/2018  . Gum inflammation 06/06/2018  . Disability of walking 06/06/2018  . 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 Insomnia 11/26/2017  . Habitual snoring 11/26/2017  . Mood disorder (HCC) 11/26/2017  . Chronic pansinusitis 09/25/2017  . Deviated septum 09/05/2017  . Hypertrophy, nasal, turbinate 09/05/2017  . Perennial allergic rhinitis 09/05/2017  . Impingement syndrome of left shoulder 04/29/2017  . Chronic left shoulder pain 04/22/2017  . Strain of right hip adductor muscle 02/05/2017  . Statin intolerance 12/13/2016  . Vitamin D insufficiency 06/21/2016  . Elevated TSH 06/21/2016  . External hemorrhoids 05/18/2016  . Benign prostatic hyperplasia  L>R; without  lower urinary tract symptoms 05/18/2016  . Health education/counseling 02/25/2016  . Restless legs syndrome (RLS) 02/15/2016  . Erectile dysfunction 02/15/2016  . h/o CLinically Signficant Anxiety 01/05/2014  . h/o Clinical depression 12/31/2013  . Hyperlipidemia 12/18/2013  . Genital Herpes 11/18/2013  . Cannot sleep 11/18/2013  . Extremity pain 11/18/2013  . Abnormal findings on diagnostic imaging of abdomen 10/29/2013  . 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 Polypharmacy  08/18/2013  . Other long term (current) drug therapy 08/18/2013  . Chronic pain 08/11/2013  . Other specified disorders of urinary tract 07/09/2012  . Abnormality of gait 07/09/2012  . Lumbago 07/09/2012  . Disturbance of skin sensation 07/09/2012  . 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 Arthritis   . Chronic insomnia   . Chronic low back pain    Annular tear at the L2-3 and L4-5 levels 4  . Depression   . Gait disorder   . Hereditary spastic paraparesis (HCC)   . Hyperlipidemia   . Internal hemorrhoids   . 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 Neurogenic bladder   . RLS (restless legs syndrome)    Family History  Problem Relation Age of Onset  . Cancer Mother        breast cancer  . Other Mother   . Hyperlipidemia Mother   . Stroke Father        Intracranial hemorrhage  . Other Brother   . Other Brother   . Other Brother   . Colon cancer Neg Hx   . Colon polyps Neg Hx   . Esophageal cancer Neg Hx   . Stomach cancer Neg Hx   . Rectal cancer Neg Hx    Past Surgical History:  Procedure Laterality Date  . APPENDECTOMY    . BREAST SURGERY Bilateral    cysts  . COLONOSCOPY    . HEMORRHOID BANDING    . KNEE ARTHROSCOPY     right  . SHOULDER SURGERY     Rotator cuff, left  . ULNAR TUNNEL RELEASE Left 09/10/2014   Procedure: LEFT CUBITAL TUNNEL RELEASE;  Surgeon: [name], MD;  Location: [facility];  Service: Orthopedics;  Laterality: Left;   Social History   Occupational History  . Occupation: Unemployed    Comment: Disability  Tobacco Use  . Smoking status: Never Smoker  . Smokeless tobacco: Never Used  Substance and Sexual Activity  . Alcohol use: Yes    Alcohol/week: 7.0 standard drinks    Types: 7 Cans of beer per week    Comment: Consumes alcohol on occasion  . Drug use: No  . Sexual activity: Yes    Birth control/protection: Other-see comments    Comment: vasectomy

## 2019-12-15 NOTE — Procedures (Signed)
Lumbosacral Transforaminal Epidural Steroid Injection - Sub-Pedicular Approach with Fluoroscopic Guidance  Patient: Terry Howard      Date of Birth: 05/14/1956 MRN: [id number] PCP: Terry Masker, PA-C      Visit Date: 12/14/2019   Universal Protocol:    Date/Time: 12/14/2019  Consent Given By: the patient  Position: PRONE  Additional Comments: Vital signs were monitored before and after the procedure. Patient was prepped and draped in the usual sterile fashion. The correct patient, procedure, and site was verified.   Injection Procedure Details:  Procedure Site One Meds Administered:  Meds ordered this encounter  Medications  . dexamethasone (DECADRON) injection 15 mg    Laterality: Left  Location/Site:  L1-L2  Needle size: 22 G  Needle type: Spinal  Needle Placement: Transforaminal  Findings:    -Comments: Excellent flow of contrast along the nerve and into the epidural space.  Procedure Details: After squaring off the end-plates to get a true AP view, the C-arm was positioned so that an oblique view of the foramen as noted above was visualized. The target area is just inferior to the "nose of the scotty dog" or sub pedicular. The soft tissues overlying this structure were infiltrated with 2-3 ml. of 1% Lidocaine without Epinephrine.  The spinal needle was inserted toward the target using a "trajectory" view along the fluoroscope beam.  Under AP and lateral visualization, the needle was advanced so it did not puncture dura and was located close the 6 O'Clock position of the pedical in AP tracterory. Biplanar projections were used to confirm position. Aspiration was confirmed to be negative for CSF and/or blood. A 1-2 ml. volume of Isovue-250 was injected and flow of contrast was noted at each level. Radiographs were obtained for documentation purposes.   After attaining the desired flow of contrast documented above, a 0.5 to 1.0 ml test dose of 0.25% Marcaine  was injected into each respective transforaminal space.  The patient was observed for 90 seconds post injection.  After no sensory deficits were reported, and normal lower extremity motor function was noted,   the above injectate was administered so that equal amounts of the injectate were placed at each foramen (level) into the transforaminal epidural space.   Additional Comments:  The patient tolerated the procedure well Dressing: 2 x 2 sterile gauze and Band-Aid    Post-procedure details: Patient was observed during the procedure. Post-procedure instructions were reviewed.  Patient left the clinic in stable condition.

## 2019-12-18 ENCOUNTER — Telehealth: Payer: Self-pay | Admitting: *Deleted

## 2019-12-21 NOTE — Telephone Encounter (Signed)
Ok but  may not be same level

## 2019-12-22 NOTE — Telephone Encounter (Signed)
Called pt and lvm #1 to get appt scheduled.

## 2019-12-22 NOTE — Telephone Encounter (Signed)
Patient called back, lvm. I returned his call and left message #2. 

## 2019-12-22 NOTE — Telephone Encounter (Signed)
Scheduled for 6/8 at 0845 with driver.

## 2019-12-25 ENCOUNTER — Telehealth: Payer: Self-pay | Admitting: Physician Assistant

## 2019-12-25 DIAGNOSIS — M79604 Pain in right leg: Secondary | ICD-10-CM

## 2019-12-25 NOTE — Telephone Encounter (Signed)
Please advise if you would be agreeable to this referral.  Tiajuana Amass, CMA

## 2019-12-25 NOTE — Addendum Note (Signed)
Addended by: Stan Head on: 12/25/2019 01:16 PM   Modules accepted: Orders

## 2019-12-25 NOTE — Telephone Encounter (Signed)
Patient is requesting a referral to our [facility] for Botox injections for bilateral leg pain. He contacted their office and was advised that he could get set up with their office but would need a PCP referral placed in Epic prior to scheduling. Please place referral in appropriate and please specify Dr. Claudette Laws for this referral.

## 2019-12-25 NOTE — Telephone Encounter (Signed)
Ok to place referral.

## 2019-12-31 ENCOUNTER — Encounter: Payer: Self-pay | Admitting: Physical Medicine & Rehabilitation

## 2020-01-12 ENCOUNTER — Encounter: Payer: No Typology Code available for payment source | Admitting: Physical Medicine and Rehabilitation

## 2020-01-15 ENCOUNTER — Encounter
Payer: No Typology Code available for payment source | Attending: Physical Medicine & Rehabilitation | Admitting: Physical Medicine & Rehabilitation

## 2020-01-15 ENCOUNTER — Encounter: Payer: Self-pay | Admitting: Physical Medicine & Rehabilitation

## 2020-01-15 ENCOUNTER — Other Ambulatory Visit: Payer: Self-pay

## 2020-01-15 VITALS — BP 138/88 | HR 90 | Temp 97.7°F | Ht 67.0 in | Wt 150.0 lb

## 2020-01-15 DIAGNOSIS — G114 Hereditary spastic paraplegia: Secondary | ICD-10-CM | POA: Diagnosis not present

## 2020-01-15 NOTE — Patient Instructions (Addendum)
May need to adjust Botox dose and muscle groups over time OnabotulinumtoxinA injection (Medical Use) What is this medicine? ONABOTULINUMTOXINA (o na BOTT you lye num tox in eh) is a neuro-muscular blocker. This medicine is used to treat crossed eyes, eyelid spasms, severe neck muscle spasms, ankle and toe muscle spasms, and elbow, wrist, and finger muscle spasms. It is also used to treat excessive underarm sweating, to prevent chronic migraine headaches, and to treat loss of bladder control due to neurologic conditions such as multiple sclerosis or spinal cord injury. This medicine may be used for other purposes; ask your health care provider or pharmacist if you have questions. COMMON BRAND NAME(S): Botox What should I tell my health care provider before I take this medicine? They need to know if you have any of these conditions:  breathing problems  cerebral palsy spasms  difficulty urinating  heart problems  history of surgery where this medicine is going to be used  infection at the site where this medicine is going to be used  myasthenia gravis or other neurologic disease  nerve or muscle disease  surgery plans  take medicines that treat or prevent blood clots  thyroid problems  an unusual or allergic reaction to botulinum toxin, albumin, other medicines, foods, dyes, or preservatives  pregnant or trying to get pregnant  breast-feeding How should I use this medicine? This medicine is for injection into a muscle. It is given by a health care professional in a hospital or clinic setting. Talk to your pediatrician regarding the use of this medicine in children. While this drug may be prescribed for children as young as 42 years old for selected conditions, precautions do apply. Overdosage: If you think you have taken too much of this medicine contact a poison control center or emergency room at once. NOTE: This medicine is only for you. Do not share this medicine with  others. What if I miss a dose? This does not apply. What may interact with this medicine?  aminoglycoside antibiotics like gentamicin, neomycin, tobramycin  muscle relaxants  other botulinum toxin injections This list may not describe all possible interactions. Give your health care provider a list of all the medicines, herbs, non-prescription drugs, or dietary supplements you use. Also tell them if you smoke, drink alcohol, or use illegal drugs. Some items may interact with your medicine. What should I watch for while using this medicine? Visit your doctor for regular check ups. This medicine will cause weakness in the muscle where it is injected. Tell your doctor if you feel unusually weak in other muscles. Get medical help right away if you have problems with breathing, swallowing, or talking. This medicine might make your eyelids droop or make you see blurry or double. If you have weak muscles or trouble seeing do not drive a car, use machinery, or do other dangerous activities. This medicine contains albumin from human blood. It may be possible to pass an infection in this medicine, but no cases have been reported. Talk to your doctor about the risks and benefits of this medicine. If your activities have been limited by your condition, go back to your regular routine slowly after treatment with this medicine. What side effects may I notice from receiving this medicine? Side effects that you should report to your doctor or health care professional as soon as possible:  allergic reactions like skin rash, itching or hives, swelling of the face, lips, or tongue  breathing problems  changes in vision  chest pain  or tightness  eye irritation, pain  fast, irregular heartbeat  infection  numbness  speech problems  swallowing problems  unusual weakness Side effects that usually do not require medical attention (report to your doctor or health care professional if they continue or  are bothersome):  bruising or pain at site where injected  drooping eyelid  dry eyes or mouth  headache  muscles aches, pains  sensitivity to light  tearing This list may not describe all possible side effects. Call your doctor for medical advice about side effects. You may report side effects to FDA at 1-800-FDA-1088. Where should I keep my medicine? This drug is given in a hospital or clinic and will not be stored at home. NOTE: This sheet is a summary. It may not cover all possible information. If you have questions about this medicine, talk to your doctor, pharmacist, or health care provider.  2020 Elsevier/Gold Standard (2018-01-27 14:21:42)

## 2020-01-15 NOTE — Progress Notes (Signed)
Subjective:    Patient ID: Terry Howard, male    DOB: 09-21-1955, 64 y.o.   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  His mother did have the illness and she really did not recognize this until late in life.  She actually died of a heat stroke according to patient.  Sees Neurologist Dr Anne Hahn  , 2 brothers have it as well/  Was diagnosed at [facility] with HSP ~22 years ago  Has used walker for ~4 years Older brothers are in [facility], one in [place] uses Electric chair, other brother in [place] uses some type of wheelchair but he does not know which type  Uses mobility scooter pulls a trailer, has difficulty loading the scooter onto a wheelchair. Went through PT for shoulder rehab ~11yrs ago, related to weight training  In terms of abnormal tone the patient has tried both tizanidine and baclofen and is currently on days but he does not think that they are very helpful   Younger brother tried Botox and has noted some improvement with this.  The patient is independent with ambulation Using a rolling walker but has difficulty with toe dragging.  In the past he is tried what sounds like knee orthoses, AFOs as well as a leather toe cap.  None of these were very helpful. He remains independent with all self-care and mobility Pain Inventory Average Pain 7 Pain Right Now 7 My pain is intermittent, tingling and aching  In the last 24 hours, has pain interfered with the following? General activity 7 Relation with others 5 Enjoyment of life 6 What TIME of day is your pain at its worst? varies Sleep (in general) Poor  Pain is worse with: sitting and other Pain improves with: none Relief from Meds: 0  Mobility walk with assistance use a cane use a walker how many minutes can you walk? 30 ability to climb steps?  no do you drive?   yes  Function disabled: date disabled 2008  Neuro/Psych weakness numbness tingling trouble walking spasms anxiety  Prior Studies new  Physicians involved in your care new   Family History  Problem Relation Age of Onset  . Cancer Mother        breast cancer  . Other Mother   . Hyperlipidemia Mother   . Stroke Father        Intracranial hemorrhage  . Other Brother   . Other Brother   . Other Brother   . Colon cancer Neg Hx   . Colon polyps Neg Hx   . Esophageal cancer Neg Hx   . Stomach cancer Neg Hx   . Rectal cancer Neg Hx    Social History   Socioeconomic History  . Marital status: Married    Spouse name: Toney Reil  . Number of children: 1  . Years of education: 73  . Highest education level: Not on file  Occupational History  . Occupation: Unemployed    Comment: Disability  Tobacco Use  . Smoking status: Never Smoker  . Smokeless tobacco: Never Used  Vaping Use  . Vaping Use: Never used  Substance and Sexual Activity  . Alcohol use: Yes    Alcohol/week: 7.0 standard drinks    Types: 7 Cans of beer per week    Comment: Consumes alcohol on occasion  . Drug  use: No  . Sexual activity: Yes    Birth control/protection: Other-see comments    Comment: vasectomy  Other Topics Concern  . Not on file  Social History Narrative   Lives with wife Investment banker, corporate) and son   Disabled   Caffeine use: Drinks coffee sometimes in the am   Right handed   occ EtOH   Non-smoker (never)   No drugs   Social Determinants of Radio broadcast assistant Strain:   . Difficulty of Paying Living Expenses:   Food Insecurity:   . Worried About Charity fundraiser in the Last Year:   . Arboriculturist in the Last Year:   Transportation Needs:   . Film/video editor (Medical):   Marland Kitchen Lack of Transportation (Non-Medical):   Physical Activity:   . Days of Exercise per Week:   . Minutes of Exercise per Session:   Stress:   . Feeling of Stress :   Social Connections:   .  Frequency of Communication with Friends and Family:   . Frequency of Social Gatherings with Friends and Family:   . Attends Religious Services:   . Active Member of Clubs or Organizations:   . Attends Archivist Meetings:   Marland Kitchen Marital Status:    Past Surgical History:  Procedure Laterality Date  . APPENDECTOMY    . BREAST SURGERY Bilateral    cysts  . COLONOSCOPY    . HEMORRHOID BANDING    . KNEE ARTHROSCOPY     right  . SHOULDER SURGERY     Rotator cuff, left  . ULNAR TUNNEL RELEASE Left 09/10/2014   Procedure: LEFT CUBITAL TUNNEL RELEASE;  Surgeon: Marianna Payment, MD;  Location: [facility];  Service: Orthopedics;  Laterality: Left;   Past Medical History:  Diagnosis Date  . Arthritis   . Chronic insomnia   . Chronic low back pain    Annular tear at the L2-3 and L4-5 levels 4  . Depression   . Gait disorder   . 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 Hyperlipidemia   . Internal hemorrhoids   . 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 Neurogenic bladder   . RLS (restless legs syndrome)    BP 138/88   Pulse 90   Temp 97.7 F (36.5 C)   Ht 5\' 7"  (1.702 m)   Wt 150 lb (68 kg)   SpO2 96%   BMI 23.49 kg/m   Opioid Risk Score:   Fall Risk Score:  `1  Depression screen PHQ 2/9  Depression screen [facility] 2/9 11/09/2019 06/15/2019 02/03/2019 01/19/2019 12/25/2018 06/06/2018 04/21/2018  Decreased Interest 0 1 0 0 0 0 0  Down, Depressed, Hopeless 0 0 0 0 1 0 0  PHQ - 2 Score 0 1 0 0 1 0 0  Altered sleeping 0 1 0 1 3 1 1   Tired, decreased energy 0 1 0 0 2 0 0  Change in appetite 0 1 0 0 1 0 0  Feeling bad or failure about yourself  0 0 0 0 1 0 0  Trouble concentrating 0 0 0 0 1 0 0  Moving slowly or fidgety/restless 0 0 0 0 3 0 0  Suicidal thoughts 0 0 0 0 0 0 0  PHQ-9 Score 0 4 0 1 12 1 1   Difficult doing work/chores - Not difficult at all Not difficult at all Not difficult at all Very difficult Not difficult at all -  Some recent data might  be hidden    Review of Systems  Musculoskeletal: Positive for gait problem.       Spasms   Neurological: Positive for weakness and numbness.       Tingling  Psychiatric/Behavioral: The patient is nervous/anxious.   All other systems reviewed and are negative.      Objective:   Physical Exam Vitals and nursing note reviewed.  Constitutional:      Appearance: He is normal weight.  HENT:     Head: Normocephalic and atraumatic.  Eyes:     General: No visual field deficit.    Extraocular Movements: Extraocular movements intact.     Conjunctiva/sclera: Conjunctivae normal.     Pupils: Pupils are equal, round, and reactive to light.  Musculoskeletal:        General: No tenderness.  Skin:    General: Skin is warm and dry.  Neurological:     Mental Status: He is alert and oriented to person, place, and time.     Cranial Nerves: Cranial nerves are intact. No dysarthria or facial asymmetry.     Sensory: Sensation is intact.     Motor: Abnormal muscle tone present. No tremor.     Coordination: Heel to Shin Test abnormal. Finger-Nose-Finger Test normal.     Gait: Gait abnormal.     Comments: Motor strength is 5/5 bilateral deltoid, bicep, finger flexors and extensors Lower extremity strength 3 - hip flexors 2+ hip adductor's and hip abductor's, 3 - knee extensors influenced by tone, trace ankle dorsiflexors bilaterally.  Tone upper extremity tone is normal Right lower extremity hip adductor's to, hamstrings 3, posterior tibialis with standing demonstrates increased tone with foot inversion Left lower extremity hip adductor MAS 2, hamstrings MAS 3, posterior tibialis with standing increasing tone with foot inversion Ambulates with a rolling walker.  He has severe toe drag bilaterally with foot inversion during ambulation.  Once he gets the foot flat he has essentially no pushoff, mild scissoring  Psychiatric:        Mood and Affect: Mood normal.           Assessment & Plan:    1.  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  The patient is limited by lower extremity spasticity as well as his weakness.  We discussed that from a strength standpoint there is not much that can be done other than physical therapy which he has tried in the past and is not wanting to resume at this time. We discussed tone management, given that he is already tried oral medications as well as therapy we will proceed to botulinum toxin injections since he has had minimal relief with the other modalities.  Botox 400U Hip Add 50 bilat Hamstring  100 Bilat Post tib 50U Bilateral

## 2020-01-27 ENCOUNTER — Telehealth: Payer: Self-pay | Admitting: Physician Assistant

## 2020-01-27 DIAGNOSIS — E785 Hyperlipidemia, unspecified: Secondary | ICD-10-CM

## 2020-01-27 DIAGNOSIS — Z789 Other specified health status: Secondary | ICD-10-CM

## 2020-01-27 NOTE — Telephone Encounter (Signed)
In the past patient was advised by previous PCP (Dr. Val Eagle) to establish with a cardiologist, but the patient has put it off. He would like to move ahead now with this referral. He is requesting Dr. Johney Frame at [facility]. If appropriate please place referral.

## 2020-01-27 NOTE — Telephone Encounter (Signed)
Referral ok per Anne Ng. Referral has been placed. AS< CMA

## 2020-01-27 NOTE — Addendum Note (Signed)
Addended by: Sylvester Harder on: 01/27/2020 11:59 AM   Modules accepted: Orders

## 2020-01-29 MED FILL — BACLOFEN 10 MG TABS: 10 | 90 days supply | Qty: 180 | Fill #1

## 2020-02-05 ENCOUNTER — Encounter: Payer: Self-pay | Admitting: Physical Medicine & Rehabilitation

## 2020-02-05 ENCOUNTER — Encounter
Payer: No Typology Code available for payment source | Attending: Physical Medicine & Rehabilitation | Admitting: Physical Medicine & Rehabilitation

## 2020-02-05 ENCOUNTER — Other Ambulatory Visit: Payer: Self-pay

## 2020-02-05 VITALS — BP 129/87 | HR 67 | Temp 98.2°F | Ht 67.0 in | Wt 150.2 lb

## 2020-02-05 DIAGNOSIS — G114 Hereditary spastic paraplegia: Secondary | ICD-10-CM

## 2020-02-05 NOTE — Patient Instructions (Signed)

## 2020-02-05 NOTE — Progress Notes (Signed)
Botox Injection for spasticity using needle EMG guidance  Dilution: 50 Units/ml Indication: Severe spasticity which interferes with ADL,mobility and/or  hygiene and is unresponsive to medication management and other conservative care Informed consent was obtained after describing risks and benefits of the procedure with the patient. This includes bleeding, bruising, infection, excessive weakness, or medication side effects. A REMS form is on file and signed. Needle: 25g 2" needle electrode Number of units per muscle Right hip add 50U Left hip add 50U Right hamstring 100U Left Hamstring 100U Right Post tib  50U Left Post tib  50U  All injections were done after obtaining appropriate EMG activity and after negative drawback for blood. The patient tolerated the procedure well. Post procedure instructions were given. A followup appointment was made.

## 2020-02-15 MED FILL — GABAPENTIN 300 MG CAPSULE: 300 | 30 days supply | Qty: 30 | Fill #2

## 2020-03-09 MED FILL — rOPINIRole HCL 2 MG TABS: 2 | 90 days supply | Qty: 45 | Fill #1

## 2020-03-15 MED FILL — GABAPENTIN 300 MG CAPSULE: 300 | 30 days supply | Qty: 30 | Fill #3

## 2020-03-15 MED FILL — traZODone HCL 100 MG TABS: 100 | 90 days supply | Qty: 90 | Fill #1

## 2020-03-18 ENCOUNTER — Encounter
Payer: No Typology Code available for payment source | Attending: Physical Medicine & Rehabilitation | Admitting: Physical Medicine & Rehabilitation

## 2020-03-18 ENCOUNTER — Encounter: Payer: Self-pay | Admitting: Physical Medicine & Rehabilitation

## 2020-03-18 ENCOUNTER — Other Ambulatory Visit: Payer: Self-pay

## 2020-03-18 VITALS — BP 109/70 | HR 70 | Temp 98.2°F | Ht 67.0 in | Wt 151.4 lb

## 2020-03-18 DIAGNOSIS — G114 Hereditary spastic paraplegia: Secondary | ICD-10-CM | POA: Diagnosis not present

## 2020-03-18 NOTE — Patient Instructions (Signed)
May try Dysport in future

## 2020-03-18 NOTE — Progress Notes (Signed)
Subjective:    Patient ID: Terry Howard, male    DOB: [birth year], 64 y.o.   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  He follows up today after botulinum toxin injection performed 6 weeks ago.  He notes no significant improvement in his walking ability.  He cannot tell whether or not his legs are looser or not. He had no problems following the injections.  02/05/2020 Right hip add 50U Left hip add 50U Right hamstring 100U Left Hamstring 100U Right Post tib  50U Left Post tib  50U   Tried AFOs in past which helped feet stay straight but pt quit using because he did not think they were helpful.  He also tried a functional electrical stimulation orthosis that his father had. Family history his brother is now in a wheelchair.  His brother is 7 years older than he is.  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  He has fallen a couple times he is now wearing football pads when he works  Pain Inventory Average Pain 0 Pain Right Now 0 My pain is dull  LOCATION OF PAIN  Thigh  BOWEL Number of stools per week: 2 Oral laxative use No  Type of laxative  Enema or suppository use No  History of colostomy No  Incontinent No   BLADDER Normal In and out cath, frequency  Able to self cath na Bladder incontinence No  Frequent urination No  Leakage with coughing No  Difficulty starting stream No  Incomplete bladder emptying No    Mobility use a cane use a walker ability to climb steps?  yes do you drive?  yes  Function disabled: date disabled 2009 I need assistance with the following:  household duties and shopping  Neuro/Psych numbness tingling trouble walking spasms anxiety  Prior Studies Any changes since last visit?  no  Physicians involved in your care Any changes since last visit?  no   Family History  Problem Relation Age of Onset  . Cancer Mother         breast cancer  . Other Mother   . Hyperlipidemia Mother   . Stroke Father        Intracranial hemorrhage  . Other Brother   . Other Brother   . Other Brother   . Colon cancer Neg Hx   . Colon polyps Neg Hx   . Esophageal cancer Neg Hx   . Stomach cancer Neg Hx   . Rectal cancer Neg Hx    Social History   Socioeconomic History  . Marital status: Married    Spouse name: Toney Reil  . Number of children: 1  . Years of education: 46  . Highest education level: Not on file  Occupational History  . Occupation: Unemployed    Comment: Disability  Tobacco Use  . Smoking status: Never Smoker  . Smokeless tobacco: Never Used  Vaping Use  . Vaping Use: Never used  Substance and Sexual Activity  . Alcohol use: Yes    Alcohol/week: 7.0 standard drinks    Types: 7 Cans of beer per week    Comment: Consumes alcohol on occasion  . Drug use: No  . Sexual activity: Yes    Birth control/protection: Other-see comments    Comment: vasectomy  Other Topics Concern  . Not on file  Social History Narrative   Lives with wife Banker) and son   Disabled   Caffeine use: Drinks  coffee sometimes in the am   Right handed   occ EtOH   Non-smoker (never)   No drugs   Social Determinants of Health   Financial Resource Strain:   . Difficulty of Paying Living Expenses:   Food Insecurity:   . Worried About Programme researcher, broadcasting/film/video in the Last Year:   . Barista in the Last Year:   Transportation Needs:   . Freight forwarder (Medical):   Marland Kitchen Lack of Transportation (Non-Medical):   Physical Activity:   . Days of Exercise per Week:   . Minutes of Exercise per Session:   Stress:   . Feeling of Stress :   Social Connections:   . Frequency of Communication with Friends and Family:   . Frequency of Social Gatherings with Friends and Family:   . Attends Religious Services:   . Active Member of Clubs or Organizations:   . Attends Banker Meetings:   Marland Kitchen Marital Status:    Past  Surgical History:  Procedure Laterality Date  . APPENDECTOMY    . BREAST SURGERY Bilateral    cysts  . COLONOSCOPY    . HEMORRHOID BANDING    . KNEE ARTHROSCOPY     right  . SHOULDER SURGERY     Rotator cuff, left  . ULNAR TUNNEL RELEASE Left 09/10/2014   Procedure: LEFT CUBITAL TUNNEL RELEASE;  Surgeon: [name], MD;  Location: [facility];  Service: Orthopedics;  Laterality: Left;   Past Medical History:  Diagnosis Date  . Arthritis   . Chronic insomnia   . Chronic low back pain    Annular tear at the L2-3 and L4-5 levels 4  . Depression   . Gait disorder   . Hereditary spastic paraparesis (HCC)   . Hyperlipidemia   . Internal hemorrhoids   . 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 Neurogenic bladder   . RLS (restless legs syndrome)    BP 109/70   Pulse 70   Temp 98.2 F (36.8 C)   Ht 5\' 7"  (1.702 m)   Wt 151 lb 6.4 oz (68.7 kg)   SpO2 97%   BMI 23.71 kg/m   Opioid Risk Score:   Fall Risk Score:  `1  Depression screen PHQ 2/9  Depression screen [facility] 2/9 02/05/2020 01/15/2020 11/09/2019 06/15/2019 02/03/2019 01/19/2019 12/25/2018  Decreased Interest 0 0 0 1 0 0 0  Down, Depressed, Hopeless 0 0 0 0 0 0 1  PHQ - 2 Score 0 0 0 1 0 0 1  Altered sleeping - 1 0 1 0 1 3  Tired, decreased energy - 1 0 1 0 0 2  Change in appetite - 0 0 1 0 0 1  Feeling bad or failure about yourself  - 0 0 0 0 0 1  Trouble concentrating - 0 0 0 0 0 1  Moving slowly or fidgety/restless - 1 0 0 0 0 3  Suicidal thoughts - 0 0 0 0 0 0  PHQ-9 Score - 3 0 4 0 1 12  Difficult doing work/chores - Somewhat difficult - Not difficult at all Not difficult at all Not difficult at all Very difficult  Some recent data might be hidden   Review of Systems  Constitutional: Negative.   HENT: Negative.   Eyes: Negative.   Respiratory: Negative.   Gastrointestinal: Negative.   Endocrine: Negative.   Genitourinary: Negative.   Musculoskeletal: Positive for gait problem.  Spasms  Skin: Negative.   Allergic/Immunologic: Negative.   Neurological: Positive for numbness.       Tingling  Hematological: Negative.   Psychiatric/Behavioral: The patient is nervous/anxious.   All other systems reviewed and are negative.      Objective:   Physical Exam Vitals and nursing note reviewed.  Eyes:     Extraocular Movements: Extraocular movements intact.     Conjunctiva/sclera: Conjunctivae normal.     Pupils: Pupils are equal, round, and reactive to light.  Skin:    General: Skin is warm and dry.  Neurological:     Mental Status: He is alert and oriented to person, place, and time.  Psychiatric:        Mood and Affect: Mood normal.        Behavior: Behavior normal.   Motor strength is 3 - hip flexors 2+ hip adductor's, 3 - knee extensors trace ankle dorsiflexors Tone Right hip adductor MAS 1 Right hamstring MAS 2 Right tibialis posterior MAS 0  Left hip adductor MAS 1 Left hamstrings MAS 2 Left tibialis posterior MAS 0  Ambulates with rolling walker, severe toe drag inversion on the right greater than left, no pushoff, scissoring improved    Assessment & Plan:   1.  Hereditary spastic paraplegia, spasticity and reduced by expected amount post Botox injection.  Average of decreased in modified Ashworth scale of one-point.  Functionally this has not helped him walk and we discussed this is most likely due to just muscle weakness.  We discussed that exercise can help a little but this is a progressive neuromuscular disease.  We discussed orthotic management and referral to PT and asking for orthotics consult.  He feels like he has already been down that road but would think about it.  We also discussed trying a different toxin such as Dysport which would allow slightly higher dosage to be given which may allow increased dosage in the hamstring.  Once again not sure how this would functionally affect his gait given the underlying weakness.  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  He states he is not ready to retire. He will call as needed for repeat injection  Discussed bracing options, has tried various braces.  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

## 2020-03-21 ENCOUNTER — Encounter: Payer: Self-pay | Admitting: Neurology

## 2020-03-21 ENCOUNTER — Other Ambulatory Visit: Payer: Self-pay | Admitting: Neurology

## 2020-03-21 ENCOUNTER — Other Ambulatory Visit: Payer: Self-pay

## 2020-03-21 ENCOUNTER — Ambulatory Visit (INDEPENDENT_AMBULATORY_CARE_PROVIDER_SITE_OTHER): Payer: No Typology Code available for payment source | Admitting: Neurology

## 2020-03-21 VITALS — BP 116/72 | HR 77 | Ht 67.0 in | Wt 149.1 lb

## 2020-03-21 DIAGNOSIS — R269 Unspecified abnormalities of gait and mobility: Secondary | ICD-10-CM | POA: Diagnosis not present

## 2020-03-21 DIAGNOSIS — G114 Hereditary spastic paraplegia: Secondary | ICD-10-CM

## 2020-03-21 DIAGNOSIS — G4700 Insomnia, unspecified: Secondary | ICD-10-CM

## 2020-03-21 DIAGNOSIS — F339 Major depressive disorder, recurrent, unspecified: Secondary | ICD-10-CM

## 2020-03-21 MED ORDER — BACLOFEN 10 MG PO TABS: 30.0000 mg | ORAL_TABLET | Freq: Every day | ORAL | 1 refills | Status: DC

## 2020-03-21 MED FILL — clonazePAM 1 MG TABS: 1 | 90 days supply | Qty: 90 | Fill #0

## 2020-03-21 NOTE — Progress Notes (Signed)
Reason for visit: Hereditary spastic paraparesis  Terry Howard is an 64 y.o. male  History of present illness:  Terry Howard is a 64 year old right-handed white male with a history of hereditary spastic paraparesis and an associated gait disorder.  He is finding it harder and harder to ambulate, he may use a walker or a wheelchair on occasion, he can walk with a cane.  He drags his toes when he walks, he wears out shoes quite rapidly.  The patient has gotten Botox injections without a whole lot of benefit.  He is on baclofen at night and gabapentin in the evening hours along with clonazepam.  The patient has heightened spasticity of the legs in the evening hours, he has also been taking Requip for possible restless leg syndrome, none of these medications have been very effective.  He is having difficulty sleeping at night.  He may have occasional falls, he has not sustained any injury.  He returns to the office today for an evaluation.  Past Medical History:  Diagnosis Date  . Arthritis   . Chronic insomnia   . Chronic low back pain    Annular tear at the L2-3 and L4-5 levels 4  . Depression   . Gait disorder   . Hereditary spastic paraparesis (HCC)   . Hyperlipidemia   . Internal hemorrhoids   . 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 Neurogenic bladder   . RLS (restless legs syndrome)     Past Surgical History:  Procedure Laterality Date  . APPENDECTOMY    . BREAST SURGERY Bilateral    cysts  . COLONOSCOPY    . HEMORRHOID BANDING    . KNEE ARTHROSCOPY     right  . SHOULDER SURGERY     Rotator cuff, left  . ULNAR TUNNEL RELEASE Left 09/10/2014   Procedure: LEFT CUBITAL TUNNEL RELEASE;  Surgeon: [name], MD;  Location: [facility];  Service: Orthopedics;  Laterality: Left;    Family History  Problem Relation Age of Onset  . Cancer Mother        breast cancer  . Other Mother   . Hyperlipidemia Mother   . Stroke Father         Intracranial hemorrhage  . Other Brother   . Other Brother   . Other Brother   . Colon cancer Neg Hx   . Colon polyps Neg Hx   . Esophageal cancer Neg Hx   . Stomach cancer Neg Hx   . Rectal cancer Neg Hx     Social history:  reports that he has never smoked. He has never used smokeless tobacco. He reports current alcohol use of about 7.0 standard drinks of alcohol per week. He reports that he does not use drugs.    Allergies  Allergen Reactions  . Prozac [Fluoxetine Hcl]     tolerate well due to SE -felt sluggish, tired, and anxious   . Pregabalin Other (See Comments)    Intolerance, somnolence  . Statins Other (See Comments)    Other reaction(s): Muscle Pain Other reaction(s): Muscle Pain Other reaction(s): Muscle Pain     Medications:  Prior to Admission medications   Medication Sig Start Date End Date Taking? Authorizing Provider  acyclovir (ZOVIRAX) 800 MG tablet Take 1 tablet (800 mg total) by mouth daily. 11/09/19  Yes Opalski, Gavin Pound, DO  baclofen (LIORESAL) 10 MG tablet TAKE 2 TABLETS BY MOUTH AT BEDTIME. 08/03/19  Yes York Spaniel,  MD  clonazePAM (KLONOPIN) 1 MG tablet Take 1 tablet (1 mg total) by mouth at bedtime. 06/18/19  Yes York Spaniel, MD  gabapentin (NEURONTIN) 300 MG capsule TAKE 1 CAPSULE (300 MG TOTAL) BY MOUTH AT BEDTIME. 11/09/19  Yes York Spaniel, MD  rOPINIRole (REQUIP) 2 MG tablet Take 0.5 tablets (1 mg total) by mouth daily. **PATIENT NEEDS APT FOR FURTHER REFILLS** 11/09/19  Yes Opalski, Gavin Pound, DO  tiZANidine (ZANAFLEX) 2 MG tablet Take 2 mg by mouth 3 (three) times daily. 01/16/20  Yes [provider]  traZODone (DESYREL) 100 MG tablet TAKE 1 TABLET BY MOUTH NIGHTLY AS NEEDED FOR SLEEP 11/09/19  Yes Opalski, Deborah, DO    ROS:  Out of a complete 14 system review of symptoms, the patient complains only of the following symptoms, and all other reviewed systems are negative.  Walking difficulty Leg stiffening, discomfort at  night  Blood pressure 116/72, pulse 77, height 5\' 7"  (1.702 m), weight 149 lb 2 oz (67.6 kg), SpO2 96 %.  Physical Exam  General: The patient is alert and cooperative at the time of the examination.  Skin: No significant peripheral edema is noted.   Neurologic Exam  Mental status: The patient is alert and oriented x 3 at the time of the examination. The patient has apparent normal recent and remote memory, with an apparently normal attention span and concentration ability.   Cranial nerves: Facial symmetry is present. Speech is normal, no aphasia or dysarthria is noted. Extraocular movements are full. Visual fields are full.  Motor: The patient has good strength in all 4 extremities, motor tone in the legs is elevated.  Sensory examination: Soft touch sensation is symmetric on the face, arms, and legs.  Coordination: The patient has good finger-nose-finger bilaterally.  The patient has difficulty performing heel-to-shin bilaterally.  Gait and station: The patient has a diplegic gait.  He walks with a cane.  He has a tendency to fall with turns.  Reflexes: Deep tendon reflexes are symmetric, but are somewhat elevated in the legs.   Assessment/Plan:  1.  Hereditary spastic paraparesis  The patient continues to have significant issues with walking.  He has tried AFO braces in the past and apparently they did not help him.  The patient has gotten Botox injections without benefit, he still has significant spasticity in the evening hours.  We will increase the baclofen dose at nighttime to 30 mg, if this is not effective we may try increasing the gabapentin.  He will follow-up here in 6 months.  MD 03/21/2020 10:39 AM  [facility] [address] [place], [place]  Phone [phone] Fax [phone]

## 2020-03-21 NOTE — Patient Instructions (Signed)
We will go up on the baclofen dose to 30 mg at night.

## 2020-04-05 ENCOUNTER — Other Ambulatory Visit: Payer: Self-pay | Admitting: Neurology

## 2020-04-05 ENCOUNTER — Telehealth: Payer: Self-pay | Admitting: Neurology

## 2020-04-05 MED ORDER — GABAPENTIN 300 MG PO CAPS
ORAL_CAPSULE | ORAL | 3 refills | Status: DC
Start: 2020-04-05 — End: 2020-04-05

## 2020-04-05 NOTE — Telephone Encounter (Signed)
Spoke to pt, he states baclofen did not work well for him and Gabapentin decreased his symptoms. He has completely stopped the baclofen and is interested in continuing gabapentin treatment.  Pt would like to know if he can increase his gabapentin.

## 2020-04-05 NOTE — Telephone Encounter (Signed)
Pt has called to report that baclofen (LIORESAL) 10 MG tablet has not done much for his RLS but  gabapentin (NEURONTIN) 300 MG capsule has.  Pt is asking for a prescription for baclofen (LIORESAL) 10 MG tablet.

## 2020-04-05 NOTE — Telephone Encounter (Signed)
I called the patient.  The patient is doing better off of the baclofen and taking gabapentin.  Will increase the gabapentin taking 300 mg in the morning and 600 mg at night.

## 2020-04-05 NOTE — Addendum Note (Signed)
Addended by: York Spaniel on: 04/05/2020 03:17 PM   Modules accepted: Orders

## 2020-04-12 MED FILL — GABAPENTIN 300 MG CAPSULE: 300 | 90 days supply | Qty: 270 | Fill #0

## 2020-04-14 ENCOUNTER — Encounter: Payer: Self-pay | Admitting: Physician Assistant

## 2020-04-14 ENCOUNTER — Other Ambulatory Visit: Payer: Self-pay | Admitting: Physician Assistant

## 2020-04-14 ENCOUNTER — Ambulatory Visit: Payer: No Typology Code available for payment source | Admitting: Internal Medicine

## 2020-04-14 ENCOUNTER — Ambulatory Visit (INDEPENDENT_AMBULATORY_CARE_PROVIDER_SITE_OTHER): Payer: No Typology Code available for payment source | Admitting: Physician Assistant

## 2020-04-14 ENCOUNTER — Other Ambulatory Visit: Payer: Self-pay

## 2020-04-14 VITALS — BP 129/81 | HR 80 | Ht 67.0 in | Wt 150.0 lb

## 2020-04-14 DIAGNOSIS — G2581 Restless legs syndrome: Secondary | ICD-10-CM | POA: Diagnosis not present

## 2020-04-14 DIAGNOSIS — G4700 Insomnia, unspecified: Secondary | ICD-10-CM

## 2020-04-14 DIAGNOSIS — G114 Hereditary spastic paraplegia: Secondary | ICD-10-CM | POA: Diagnosis not present

## 2020-04-14 MED ORDER — ROTIGOTINE 1 MG/24HR TD PT24: 1.0000 | MEDICATED_PATCH | Freq: Every day | TRANSDERMAL | 1 refills | Status: DC

## 2020-04-14 MED ORDER — ESZOPICLONE 1 MG PO TABS: 1.0000 mg | ORAL_TABLET | Freq: Every evening | ORAL | 0 refills | Status: DC | PRN

## 2020-04-14 MED FILL — ESZOPICLONE 1 MG TABLET: 1 | 30 days supply | Qty: 30 | Fill #0

## 2020-04-14 NOTE — Patient Instructions (Signed)

## 2020-04-14 NOTE — Progress Notes (Signed)
Established Patient Office Visit  Subjective:  Patient ID: Terry Howard, male    DOB: [birth year]  Age: 64 y.o. MRN: [id number]  CC: No chief complaint on file.   HPI KIEV LABROSSE presents for discussion of insomnia and RLS management. Pt reports worsening symptoms of RLS and poor sleep and is inquiring about changing medications. Pt does have hereditary spastic paraparesis and is followed by Neurology. States his neurologist recently doubled his Gabapentin dose. He is currently taking trazodone for sleep and self increased his dose to 125 mg which was ineffective, he just felt sluggish. He has trouble with falling asleep. Reports in the past he was on Ambien CR which helped better with his sleep and is requesting to change to this. He is currently on Requip for RLS which isn't too helpful. In the past he has tried Mirapex which was unsuccessful. He is inquiring about Neupro patch.   Past Medical History:  Diagnosis Date  . Arthritis   . Chronic insomnia   . Chronic low back pain    Annular tear at the L2-3 and L4-5 levels 4  . Depression   . Gait disorder   . Hereditary spastic paraparesis (HCC)   . Hyperlipidemia   . Internal hemorrhoids   . 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 Neurogenic bladder   . RLS (restless legs syndrome)     Past Surgical History:  Procedure Laterality Date  . APPENDECTOMY    . BREAST SURGERY Bilateral    cysts  . COLONOSCOPY    . HEMORRHOID BANDING    . KNEE ARTHROSCOPY     right  . SHOULDER SURGERY     Rotator cuff, left  . ULNAR TUNNEL RELEASE Left 09/10/2014   Procedure: LEFT CUBITAL TUNNEL RELEASE;  Surgeon: [name], MD;  Location: [facility];  Service: Orthopedics;  Laterality: Left;    Family History  Problem Relation Age of Onset  . Cancer Mother        breast cancer  . Other Mother   . Hyperlipidemia Mother   . Stroke Father        Intracranial hemorrhage  . Other Brother   . Other  Brother   . Other Brother   . Colon cancer Neg Hx   . Colon polyps Neg Hx   . Esophageal cancer Neg Hx   . Stomach cancer Neg Hx   . Rectal cancer Neg Hx     Social History   Socioeconomic History  . Marital status: Married    Spouse name: Toney Reil  . Number of children: 1  . Years of education: 38  . Highest education level: Not on file  Occupational History  . Occupation: Unemployed    Comment: Disability  Tobacco Use  . Smoking status: Never Smoker  . Smokeless tobacco: Never Used  Vaping Use  . Vaping Use: Never used  Substance and Sexual Activity  . Alcohol use: Yes    Alcohol/week: 7.0 standard drinks    Types: 7 Cans of beer per week    Comment: Consumes alcohol on occasion  . Drug use: No  . Sexual activity: Yes    Birth control/protection: Other-see comments    Comment: vasectomy  Other Topics Concern  . Not on file  Social History Narrative   Lives with wife Banker) and son   Disabled   Caffeine use: Drinks coffee sometimes in the am   Right handed   occ EtOH  Non-smoker (never)   No drugs   Social Determinants of Health   Financial Resource Strain:   . Difficulty of Paying Living Expenses: Not on file  Food Insecurity:   . Worried About Programme researcher, broadcasting/film/video in the Last Year: Not on file  . Ran Out of Food in the Last Year: Not on file  Transportation Needs:   . Lack of Transportation (Medical): Not on file  . Lack of Transportation (Non-Medical): Not on file  Physical Activity:   . Days of Exercise per Week: Not on file  . Minutes of Exercise per Session: Not on file  Stress:   . Feeling of Stress : Not on file  Social Connections:   . Frequency of Communication with Friends and Family: Not on file  . Frequency of Social Gatherings with Friends and Family: Not on file  . Attends Religious Services: Not on file  . Active Member of Clubs or Organizations: Not on file  . Attends Banker Meetings: Not on file  . Marital Status: Not on  file  Intimate Partner Violence:   . Fear of Current or Ex-Partner: Not on file  . Emotionally Abused: Not on file  . Physically Abused: Not on file  . Sexually Abused: Not on file    Outpatient Medications Prior to Visit  Medication Sig Dispense Refill  . acyclovir (ZOVIRAX) 800 MG tablet Take 1 tablet (800 mg total) by mouth daily. 90 tablet 1  . clonazePAM (KLONOPIN) 1 MG tablet TAKE 1 TABLET (1 MG TOTAL) BY MOUTH AT BEDTIME. 90 tablet 1  . gabapentin (NEURONTIN) 300 MG capsule 1 capsule in the morning, 2 in the evening 270 capsule 3  . rOPINIRole (REQUIP) 2 MG tablet Take 0.5 tablets (1 mg total) by mouth daily. **PATIENT NEEDS APT FOR FURTHER REFILLS** 90 tablet 1  . traZODone (DESYREL) 100 MG tablet TAKE 1 TABLET BY MOUTH NIGHTLY AS NEEDED FOR SLEEP 90 tablet 1  . tiZANidine (ZANAFLEX) 2 MG tablet Take 2 mg by mouth 3 (three) times daily.      No facility-administered medications prior to visit.    Allergies  Allergen Reactions  . Prozac [Fluoxetine Hcl]     tolerate well due to SE -felt sluggish, tired, and anxious   . Pregabalin Other (See Comments)    Intolerance, somnolence  . Statins Other (See Comments)    Other reaction(s): Muscle Pain Other reaction(s): Muscle Pain Other reaction(s): Muscle Pain     ROS Review of Systems A fourteen system review of systems was performed and found to be positive as per HPI.  Objective:    Physical Exam General:  Well Developed, well nourished, in no acute distress Neuro:  Alert and oriented,  No focal deficits HEENT:  Normocephalic, atraumatic, neck supple, no carotid bruits appreciated  Skin:  no gross rash, warm, pink. Cardiac:  RRR, S1 S2 Respiratory:  ECTA B/L, Not using accessory muscles, speaking in full sentences- unlabored. Vascular:  Ext warm, no cyanosis apprec. Psych:  No HI/SI, judgement and insight good, Euthymic mood. Full Affect.   BP 129/81   Pulse 80   Ht 5\' 7"  (1.702 m)   Wt 150 lb (68 kg)   SpO2  98%   BMI 23.49 kg/m  Wt Readings from Last 3 Encounters:  04/19/20 151 lb 12.8 oz (68.9 kg)  04/14/20 150 lb (68 kg)  03/21/20 149 lb 2 oz (67.6 kg)     Health Maintenance Due  Topic Date Due  .  TETANUS/TDAP  08/06/2018  . INFLUENZA VACCINE  03/06/2020    There are no preventive care reminders to display for this patient.  Lab Results  Component Value Date   TSH 4.320 01/19/2019   Lab Results  Component Value Date   WBC 5.4 01/19/2019   HGB 15.4 01/19/2019   HCT 44.2 01/19/2019   MCV 88 01/19/2019   PLT 190 06/11/2017   Lab Results  Component Value Date   NA 143 01/19/2019   K 4.6 01/19/2019   CO2 24 01/19/2019   GLUCOSE 100 (H) 01/19/2019   BUN 19 01/19/2019   CREATININE 0.91 01/19/2019   BILITOT 0.5 01/19/2019   ALKPHOS 106 01/19/2019   AST 24 01/19/2019   ALT 28 01/19/2019   PROT 6.4 01/19/2019   ALBUMIN 4.2 01/19/2019   CALCIUM 9.4 01/19/2019   Lab Results  Component Value Date   CHOL 225 (H) 12/09/2019   Lab Results  Component Value Date   HDL 39 (L) 12/09/2019   Lab Results  Component Value Date   LDLCALC 165 (H) 12/09/2019   Lab Results  Component Value Date   TRIG 118 12/09/2019   Lab Results  Component Value Date   CHOLHDL 5.8 (H) 12/09/2019   Lab Results  Component Value Date   HGBA1C 4.8 01/19/2019      Assessment & Plan:   Problem List Items Addressed This Visit      Nervous and Auditory   Hereditary spastic paraparesis (HCC) - Primary (Chronic)     Other   Restless legs syndrome (RLS) (Chronic)   Relevant Medications   Rotigotine 1 MG/24HR PT24   Insomnia   Relevant Medications   eszopiclone (LUNESTA) 1 MG TABS tablet     RLS: -Discussed with patient it is reasonable to trial Neupro patch and will closely monitor therapy with Requip.  -Follow up in 8 weeks to reassess symptoms and medication therapy.  Insomnia: -Discussed with patient Alfonso Patten would be a better alternative versus Ambien CR due to possible  medication interaction with Gabapentin. Pt verbalized understanding and agreeable. -Follow up in 8 weeks to reassess sleep and medication therapy.  Hereditary spastic paraparesis: -Followed by Neurology. -Continue current medication regimen.   Meds ordered this encounter  Medications  . Rotigotine 1 MG/24HR PT24    Sig: Place 1 patch (1 mg total) onto the skin daily.    Dispense:  30 patch    Refill:  1    Order Specific Question:   Supervising Provider    Answer:   Nani Gasser D [[id number]]  . eszopiclone (LUNESTA) 1 MG TABS tablet    Sig: Take 1 tablet (1 mg total) by mouth at bedtime as needed for sleep. Take immediately before bedtime    Dispense:  30 tablet    Refill:  0    Order Specific Question:   Supervising Provider    Answer:   Nani Gasser D [[id number]]    Follow-up: Return in about 8 weeks (around 06/09/2020) for Insomnia-changed med, RLS- added new med.   Note:  This note was prepared with assistance of Dragon voice recognition software. Occasional wrong-word or sound-a-like substitutions may have occurred due to the inherent limitations of voice recognition software.   Mayer Masker, PA-C

## 2020-04-15 MED FILL — NEUPRO 1 MG/24 HR PATCH: 1 | 30 days supply | Qty: 30 | Fill #0

## 2020-04-19 ENCOUNTER — Ambulatory Visit: Payer: No Typology Code available for payment source | Admitting: Internal Medicine

## 2020-04-19 ENCOUNTER — Encounter: Payer: Self-pay | Admitting: Internal Medicine

## 2020-04-19 ENCOUNTER — Other Ambulatory Visit: Payer: Self-pay

## 2020-04-19 ENCOUNTER — Other Ambulatory Visit: Payer: Self-pay | Admitting: Internal Medicine

## 2020-04-19 VITALS — BP 126/78 | HR 74 | Ht 67.0 in | Wt 151.8 lb

## 2020-04-19 DIAGNOSIS — G114 Hereditary spastic paraplegia: Secondary | ICD-10-CM | POA: Diagnosis not present

## 2020-04-19 DIAGNOSIS — T466X5A Adverse effect of antihyperlipidemic and antiarteriosclerotic drugs, initial encounter: Secondary | ICD-10-CM

## 2020-04-19 DIAGNOSIS — E785 Hyperlipidemia, unspecified: Secondary | ICD-10-CM

## 2020-04-19 DIAGNOSIS — M791 Myalgia, unspecified site: Secondary | ICD-10-CM | POA: Diagnosis not present

## 2020-04-19 MED ORDER — EZETIMIBE 10 MG PO TABS: 10.0000 mg | ORAL_TABLET | Freq: Every day | ORAL | 3 refills | Status: DC

## 2020-04-19 MED FILL — EZETIMIBE 10 MG TABS: 10 | 90 days supply | Qty: 90 | Fill #0

## 2020-04-19 NOTE — Patient Instructions (Signed)
Medication Instructions:  START zetia 10mg  daily  *If you need a refill on your cardiac medications before your next appointment, please call your pharmacy*   Lab Work: FASTING lipid panel in 3 months to check cholesterol  Please complete about 1 week before your next visit   If you have labs (blood work) drawn today and your tests are completely normal, you will receive your results only by: MyChart Message (if you have MyChart) OR . A paper copy in the mail If you have any lab test that is abnormal or we need to change your treatment, we will call you to review the results.   Testing/Procedures: NONE   Follow-Up: At [facility], you and your health needs are our priority.  As part of our continuing mission to provide you with exceptional heart care, we have created designated Provider Care Teams.  These Care Teams include your primary Cardiologist (physician) and Advanced Practice Providers (APPs -  Physician Assistants and Nurse Practitioners) who all work together to provide you with the care you need, when you need it.  We recommend signing up for the patient portal called "MyChart".  Sign up information is provided on this After Visit Summary.  MyChart is used to connect with patients for Virtual Visits (Telemedicine).  Patients are able to view lab/test results, encounter notes, upcoming appointments, etc.  Non-urgent messages can be sent to your provider as well.   To learn more about what you can do with MyChart, go to [facility].    Your next appointment:   3 month(s) - lipid clinic  The format for your next appointment:   In Person  Provider:   [email or web address] Hilty, MD   Other Instructions

## 2020-04-19 NOTE — Progress Notes (Signed)
LIPID CLINIC CONSULT NOTE  Chief Complaint:  Manage dyslipidemia  Primary Care Physician: Mayer Masker, PA-C  Primary Cardiologist:  No primary care provider on file.  HPI:  Terry Howard is a 64 y.o. male who is being seen today for the evaluation of dyslipidemia at the request of Mayer Masker, PA-C.  Terry Howard is a pleasant 64 year old male kindly referred to the lipid clinic for evaluation and management of dyslipidemia.  History is significant for unfortunate hereditary spastic paraparesis, dyslipidemia and other neurologic conditions, who presents for evaluation and management of dyslipidemia.  Terry Howard has some longstanding dyslipidemia but no known coronary disease.  There is a questionable family history of heart disease having noted that his mother died with cancer but had high cholesterol and his father having died of complications related to alcohol use.  Most recently Terry Howard had a cholesterol profile showing total cholesterol 225, HDL 39, LDL 165 and triglycerides 518.  He reports overall pretty healthy diet, however is not able to exercise with the lower extremities to do a lot of aerobic activity but does exercise regularly of his upper extremities.  He has no history of hypertension, diabetes, tobacco abuse or other cardiovascular risk factors.  Weight is actually appropriate and blood pressure is well controlled today.  Unfortunately, Terry Howard has been on a number of different statin medications in the past all of which caused worsening myalgias and spasticity of his lower extremities.  He reported he would not take any additional statins.  He also brought a list of possible statin alternatives with him to the office today to review.  PMHx:  Past Medical History:  Diagnosis Date   Arthritis    Chronic insomnia    Chronic low back pain    Annular tear at the L2-3 and L4-5 levels 4   Depression    Gait disorder    Hereditary spastic paraparesis  (HCC)    Hyperlipidemia    Internal hemorrhoids    Mild carpal tunnel syndrome of right wrist    Neurogenic bladder    RLS (restless legs syndrome)     Past Surgical History:  Procedure Laterality Date   APPENDECTOMY     BREAST SURGERY Bilateral    cysts   COLONOSCOPY     HEMORRHOID BANDING     KNEE ARTHROSCOPY     right   SHOULDER SURGERY     Rotator cuff, left   ULNAR TUNNEL RELEASE Left 09/10/2014   Procedure: LEFT CUBITAL TUNNEL RELEASE;  Surgeon: [name], MD;  Location: [facility];  Service: Orthopedics;  Laterality: Left;    FAMHx:  Family History  Problem Relation Age of Onset   Cancer Mother        breast cancer   Other Mother    Hyperlipidemia Mother    Stroke Father        Intracranial hemorrhage   Other Brother    Other Brother    Other Brother    Colon cancer Neg Hx    Colon polyps Neg Hx    Esophageal cancer Neg Hx    Stomach cancer Neg Hx    Rectal cancer Neg Hx     SOCHx:   reports that he has never smoked. He has never used smokeless tobacco. He reports current alcohol use of about 7.0 standard drinks of alcohol per week. He reports that he does not use drugs.  ALLERGIES:  Allergies  Allergen Reactions   Prozac [Fluoxetine  Hcl]     tolerate well due to SE -felt sluggish, tired, and anxious    Pregabalin Other (See Comments)    Intolerance, somnolence   Statins Other (See Comments)    Other reaction(s): Muscle Pain Other reaction(s): Muscle Pain Other reaction(s): Muscle Pain     ROS: Pertinent items noted in HPI and remainder of comprehensive ROS otherwise negative.  HOME MEDS: Current Outpatient Medications on File Prior to Visit  Medication Sig Dispense Refill   acyclovir (ZOVIRAX) 800 MG tablet Take 1 tablet (800 mg total) by mouth daily. 90 tablet 1   clonazePAM (KLONOPIN) 1 MG tablet TAKE 1 TABLET (1 MG TOTAL) BY MOUTH AT BEDTIME. 90 tablet 1   eszopiclone (LUNESTA) 1 MG TABS  tablet Take 1 tablet (1 mg total) by mouth at bedtime as needed for sleep. Take immediately before bedtime 30 tablet 0   gabapentin (NEURONTIN) 300 MG capsule 1 capsule in the morning, 2 in the evening 270 capsule 3   rOPINIRole (REQUIP) 2 MG tablet Take 0.5 tablets (1 mg total) by mouth daily. **PATIENT NEEDS APT FOR FURTHER REFILLS** 90 tablet 1   Rotigotine 1 MG/24HR PT24 Place 1 patch (1 mg total) onto the skin daily. 30 patch 1   tiZANidine (ZANAFLEX) 2 MG tablet Take 2 mg by mouth 3 (three) times daily.      No current facility-administered medications on file prior to visit.    LABS/IMAGING: No results found for this or any previous visit (from the past 48 hour(s)). No results found.  LIPID PANEL:    Component Value Date/Time   CHOL 225 (H) 12/09/2019 0836   TRIG 118 12/09/2019 0836   HDL 39 (L) 12/09/2019 0836   CHOLHDL 5.8 (H) 12/09/2019 0836   CHOLHDL 4.8 05/16/2016 1027   VLDL 17 05/16/2016 1027   LDLCALC 165 (H) 12/09/2019 0836    WEIGHTS: Wt Readings from Last 3 Encounters:  04/19/20 151 lb 12.8 oz (68.9 kg)  04/14/20 150 lb (68 kg)  03/21/20 149 lb 2 oz (67.6 kg)    VITALS: BP 126/78    Pulse 74    Ht 5\' 7"  (1.702 m)    Wt 151 lb 12.8 oz (68.9 kg)    SpO2 97%    BMI 23.78 kg/m   EXAM: General appearance: alert and no distress Neck: no carotid bruit, no JVD and thyroid not enlarged, symmetric, no tenderness/mass/nodules Lungs: clear to auscultation bilaterally Heart: regular rate and rhythm Abdomen: soft, non-tender; bowel sounds normal; no masses,  no organomegaly Extremities: extremities normal, atraumatic, no cyanosis or edema Pulses: 2+ and symmetric Skin: Skin color, texture, turgor normal. No rashes or lesions Neurologic: Mental status: Alert, oriented, thought content appropriate, Bilateral lower extremity spastic paraplegia Psych: Pleasant  EKG: Deferred  ASSESSMENT: 1. Mixed dyslipidemia 2. Statin intolerant-myalgias 3. Hereditary  spastic paraplegia  PLAN: 1.   Overall Terry Howard has probably a relatively low cardiovascular risk due to lack of a significant family history of early onset heart disease and associated risk factors.  He does have a mixed dyslipidemia and is not able to do a lot of cardiopulmonary exercise due to his spastic paraplegia.  Lipids are elevated despite a pretty good diet and normal weight.  I suspect there is a strong genetic component in this possibly from his mother.  Unfortunately, he cannot take statins due to worsening myalgias.  I think would be very reasonable to target an LDL less than 100 which is aggressive therapy and I think  we could accomplish that with a nonstatin alternative such as ezetimibe.  Recommend starting ezetimibe 10 mg daily.  We will repeat lipids in about 3 months and follow-up at that time.  Thanks again for the kind referral.  Chrystie Nose, MD, [facility]  [place]   [facility]  Medical Director of the Advanced Lipid Disorders &  Cardiovascular Risk Reduction Clinic Diplomate of the American Board of Clinical Lipidology Attending Cardiologist  Direct Dial: [phone]   Fax: [phone]  Website:  [email or web address] 04/19/2020, 8:17 PM

## 2020-05-13 MED FILL — NEUPRO 1 MG/24 HR PATCH: 1 | 30 days supply | Qty: 30 | Fill #1

## 2020-05-16 MED FILL — ACYCLOVIR 800 MG TABLET: 800 | 90 days supply | Qty: 90 | Fill #1

## 2020-06-08 MED FILL — rOPINIRole HCL 2 MG TABS: 2 | 90 days supply | Qty: 45 | Fill #2

## 2020-06-09 ENCOUNTER — Telehealth: Payer: Self-pay | Admitting: Neurology

## 2020-06-09 ENCOUNTER — Encounter: Payer: Self-pay | Admitting: Physician Assistant

## 2020-06-09 ENCOUNTER — Other Ambulatory Visit: Payer: Self-pay

## 2020-06-09 ENCOUNTER — Other Ambulatory Visit: Payer: Self-pay | Admitting: Physician Assistant

## 2020-06-09 ENCOUNTER — Ambulatory Visit (INDEPENDENT_AMBULATORY_CARE_PROVIDER_SITE_OTHER): Payer: No Typology Code available for payment source | Admitting: Physician Assistant

## 2020-06-09 VITALS — HR 72 | Ht 67.0 in | Wt 151.0 lb

## 2020-06-09 DIAGNOSIS — G2581 Restless legs syndrome: Secondary | ICD-10-CM | POA: Diagnosis not present

## 2020-06-09 DIAGNOSIS — G4700 Insomnia, unspecified: Secondary | ICD-10-CM | POA: Diagnosis not present

## 2020-06-09 DIAGNOSIS — G114 Hereditary spastic paraplegia: Secondary | ICD-10-CM | POA: Diagnosis not present

## 2020-06-09 MED ORDER — ROTIGOTINE 1 MG/24HR TD PT24: 1.0000 | MEDICATED_PATCH | Freq: Every day | TRANSDERMAL | 3 refills | Status: DC

## 2020-06-09 MED FILL — NEUPRO 1 MG/24 HR PATCH: 1 | 30 days supply | Qty: 30 | Fill #0

## 2020-06-09 NOTE — Telephone Encounter (Signed)
I called the patient, left message, I will call back later. 

## 2020-06-09 NOTE — Telephone Encounter (Signed)
Pt is asking for a call to discuss coming off of gabapentin (NEURONTIN) 300 MG capsule and going on Nu Pro and Ambien for sleep, please call

## 2020-06-09 NOTE — Progress Notes (Signed)
Telehealth office visit note for Terry Masker, PA-C- at Primary Care at [facility]   I connected with current patient today by telephone and verified that I am speaking with the correct person   . Location of the patient: Home . Location of the provider: Office - This visit type was conducted due to national recommendations for restrictions regarding the COVID-19 Pandemic (e.g. social distancing) in an effort to limit this patient's exposure and mitigate transmission in our community.    - No physical exam could be performed with this format, beyond that communicated to [place] by the patient/ family members as noted.   - Additionally my office staff/ schedulers were to discuss with the patient that there may be a monetary charge related to this service, depending on their medical insurance.  My understanding is that patient understood and consented to proceed.     _________________________________________________________________________________   History of Present Illness: Patient calls in to follow-up on insomnia and restless leg syndrome. States the patch has greatly helped improved his restless leg symptoms and are not as severe, which he is happy about.  The Lunesta was not effective for insomnia.  States he was not able to sleep and felt sluggish.  States he would like to try Ambien CR and is interested on possibly coming off gabapentin, which he takes for RLS.  Gabapentin is managed by Dr. Anne Hahn ([facility]).  Also inquiring about possibly getting blood work done here for Dr. Rennis Golden for his upcoming appointment in December.     GAD 7 : Generalized Anxiety Score 12/25/2018  Nervous, Anxious, on Edge 2  Control/stop worrying 2  Worry too much - different things 1  Trouble relaxing 1  Restless 1  Easily annoyed or irritable 2  Afraid - awful might happen 2  Total GAD 7 Score 11  Anxiety Difficulty Very difficult    Depression screen [facility] 2/9 04/14/2020 02/05/2020 01/15/2020 11/09/2019  06/15/2019  Decreased Interest 0 0 0 0 1  Down, Depressed, Hopeless 0 0 0 0 0  PHQ - 2 Score 0 0 0 0 1  Altered sleeping 1 - 1 0 1  Tired, decreased energy 0 - 1 0 1  Change in appetite 0 - 0 0 1  Feeling bad or failure about yourself  0 - 0 0 0  Trouble concentrating 0 - 0 0 0  Moving slowly or fidgety/restless 0 - 1 0 0  Suicidal thoughts 0 - 0 0 0  PHQ-9 Score 1 - 3 0 4  Difficult doing work/chores Not difficult at all - Somewhat difficult - Not difficult at all  Some recent data might be hidden      Impression and Recommendations:     1. Hereditary spastic paraparesis (HCC)   2. Restless legs syndrome (RLS)   3. Insomnia, unspecified type     Hereditary spastic paraparesis, RLS: -Followed by Neurology. -RLS symptoms under better control with Rotigotine 1 mg patch so will continue. Provided refill.  Insomnia: -Symptoms fail to improve with Lunesta 1 mg. -Advised patient to discuss with neurologist recommendation on Ambien CR with gabapentin due to potential side effects with concurrent use of two CNS depressants. Patient will notify me of recommendation. Discussed with patient Gabapentin should be tapered if wants to discontinue and should be dicussed with Dr. Anne Hahn.   - As part of my medical decision making, I reviewed the following data within the electronic MEDICAL RECORD NUMBER History obtained from pt /family, CMA  notes reviewed and incorporated if applicable, Labs reviewed, Radiograph/ tests reviewed if applicable and OV notes from prior OV's with me, as well as any other specialists she/he has seen since seeing me last, were all reviewed and used in my medical decision making process today.    - Additionally, when appropriate, discussion had with patient regarding our treatment plan, and their biases/concerns about that plan were used in my medical decision making today.    - The patient agreed with the plan and demonstrated an understanding of the instructions.   No  barriers to understanding were identified.     - The patient was advised to call back or seek an in-person evaluation if the symptoms worsen or if the condition fails to improve as anticipated.   Return for Insomnia, RLS in 4-5 months.    No orders of the defined types were placed in this encounter.   Meds ordered this encounter  Medications  . Rotigotine 1 MG/24HR PT24    Sig: Place 1 patch (1 mg total) onto the skin daily.    Dispense:  30 patch    Refill:  3    Order Specific Question:   Supervising Provider    Answer:   Nani Gasser D [[id number]]    Medications Discontinued During This Encounter  Medication Reason  . eszopiclone (LUNESTA) 1 MG TABS tablet Side effect (s)  . Rotigotine 1 MG/24HR PT24 Reorder       Time spent on visit including pre-visit chart review and post-visit care was 12 minutes.      The 21st Century Cures Act was signed into law in 2016 which includes the topic of electronic health records.  This provides immediate access to information in MyChart.  This includes consultation notes, operative notes, office notes, lab results and pathology reports.  If you have any questions about what you read please let us know at your next visit or call us at the office.  We are right here with you.  Note:  This note was prepared with assistance of Dragon voice recognition software. Occasional wrong-word or sound-a-like substitutions may have occurred due to the inherent limitations of voice recognition software.  __________________________________________________________________________________     Patient Care Team    Relationship Specialty Notifications Start End  Terry Howard, [place] PCP - General   12/06/19   Tyrell Antonio, MD Consulting Physician Physical Medicine and Rehabilitation  05/22/16   York Spaniel, MD Consulting Physician Neurology  05/22/16   Specialists, [facility]  Orthopedic Surgery  12/11/16   Suzanna Obey, MD  Consulting Physician Otolaryngology  10/04/17   Iva Boop, MD Consulting Physician Gastroenterology  10/04/17   Drema Halon, MD Consulting Physician Otolaryngology  10/04/17   Ihor Gully, MD (Inactive) Consulting Physician Urology  10/04/17      -Vitals obtained; medications/ allergies reconciled;  personal medical, social, Sx etc.histories were updated by CMA, reviewed by me and are reflected in chart   Patient Active Problem List   Diagnosis Date Noted  . Internal and external prolapsed hemorrhoids 03/10/2019  . Anal skin tags 03/10/2019  . Constipation 03/10/2019  . Pain in left elbow 12/10/2018  . Cubital tunnel syndrome on left 11/06/2018  . Gum hypertrophy 06/06/2018  . Gum inflammation 06/06/2018  . Disability of walking 06/06/2018  . 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 Habitual snoring 11/26/2017  . Chronic pansinusitis 09/25/2017  . Deviated septum 09/05/2017  . Hypertrophy, nasal, turbinate 09/05/2017  . Perennial allergic  rhinitis 09/05/2017  . Impingement syndrome of left shoulder 04/29/2017  . Chronic left shoulder pain 04/22/2017  . Strain of right hip adductor muscle 02/05/2017  . Statin intolerance 12/13/2016  . Vitamin D insufficiency 06/21/2016  . Elevated TSH 06/21/2016  . External hemorrhoids 05/18/2016  . Benign prostatic hyperplasia  L>R; without lower urinary tract symptoms 05/18/2016  . Restless legs syndrome (RLS) 02/15/2016  . Erectile dysfunction 02/15/2016  . h/o CLinically Signficant Anxiety 01/05/2014  . h/o Clinical depression 12/31/2013  . Depressive disorder 12/31/2013  . Hyperlipemia 12/18/2013  . Genital Herpes 11/18/2013  . Cannot sleep 11/18/2013  . Pain in soft tissues of limb 11/18/2013  . Insomnia 11/18/2013  . Abnormal findings on diagnostic imaging of abdomen 10/29/2013  . 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 Polypharmacy 08/18/2013  . Other long term (current) drug therapy 08/18/2013   . Encounter for long-term (current) use of other medications 08/18/2013  . Chronic pain 08/11/2013  . Other specified disorders of urinary tract 07/09/2012  . Abnormality of gait 07/09/2012  . Lumbago 07/09/2012  . Disturbance of skin sensation 07/09/2012  . 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 acyclovir (ZOVIRAX) 800 MG tablet Take 1 tablet (800 mg total) by mouth daily.  . clonazePAM (KLONOPIN) 1 MG tablet TAKE 1 TABLET (1 MG TOTAL) BY MOUTH AT BEDTIME.  Marland Kitchen ezetimibe (ZETIA) 10 MG tablet Take 1 tablet (10 mg total) by mouth daily.  Marland Kitchen gabapentin (NEURONTIN) 300 MG capsule 1 capsule in the morning, 2 in the evening  . rOPINIRole (REQUIP) 2 MG tablet Take 0.5 tablets (1 mg total) by mouth daily. **PATIENT NEEDS APT FOR FURTHER REFILLS**  . Rotigotine 1 MG/24HR PT24 Place 1 patch (1 mg total) onto the skin daily.  Marland Kitchen tiZANidine (ZANAFLEX) 2 MG tablet Take 2 mg by mouth 3 (three) times daily.   . [DISCONTINUED] Rotigotine 1 MG/24HR PT24 Place 1 patch (1 mg total) onto the skin daily.     Allergies:  Allergies  Allergen Reactions  . Prozac [Fluoxetine Hcl]     tolerate well due to SE -felt sluggish, tired, and anxious   . Pregabalin Other (See Comments)    Intolerance, somnolence  . Statins Other (See Comments)    Other reaction(s): Muscle Pain Other reaction(s): Muscle Pain Other reaction(s): Muscle Pain      ROS:  See above HPI for pertinent positives and negatives   Objective:   Pulse 72, height 5\' 7"  (1.702 m), weight 151 lb (68.5 kg).  (if some vitals are omitted, this means that patient was UNABLE to obtain them even though they were asked to get them prior to OV today.  They were asked to call at their earliest convenience with these once obtained. ) General: A & O * 3; sounds in no acute distress; in usual state of health.  Respiratory: speaking in full sentences, no conversational dyspnea; Psych: insight appears good,  mood- appears full

## 2020-06-10 ENCOUNTER — Other Ambulatory Visit: Payer: Self-pay | Admitting: Neurology

## 2020-06-10 MED ORDER — ROPINIROLE HCL 3 MG PO TABS: 3.0000 mg | ORAL_TABLET | Freq: Every day | ORAL | 1 refills | Status: DC

## 2020-06-10 NOTE — Telephone Encounter (Signed)
I called the patient.  The patient is having ongoing problems with his legs "going crazy".  This happens in the evening hours, but can occur in mid afternoon if he is sitting down watching TV.  The addition of the Neupro patch to the Requip has offered good benefit.  The Neupro patch is very expensive, Requip is very cheap.  I recommended going up on the Requip dose, if he has to dose sometimes in the afternoon with Requip, he can do so.  I will send in prescription for the 3 mg Requip tablets.  I would not stop the gabapentin at this time, this also helps restless legs.  I would be hesitant to use Ambien in someone taking clonazepam at night.

## 2020-06-10 NOTE — Addendum Note (Signed)
Addended by: York Spaniel on: 06/10/2020 05:41 PM   Modules accepted: Orders

## 2020-07-05 MED FILL — rOPINIRole HCL 3 MG TABS: 3 | 90 days supply | Qty: 90 | Fill #0

## 2020-07-07 ENCOUNTER — Other Ambulatory Visit: Payer: Self-pay | Admitting: Physician Assistant

## 2020-07-07 DIAGNOSIS — E785 Hyperlipidemia, unspecified: Secondary | ICD-10-CM

## 2020-07-07 NOTE — Progress Notes (Signed)
Lipid ordered per note in patient chart from apt with Dr. Rennis Golden in September:  PLAN: 1.   Overall Mr. Rothbauer has probably a relatively low cardiovascular risk due to lack of a significant family history of early onset heart disease and associated risk factors.  He does have a mixed dyslipidemia and is not able to do a lot of cardiopulmonary exercise due to his spastic paraplegia.  Lipids are elevated despite a pretty good diet and normal weight.  I suspect there is a strong genetic component in this possibly from his mother.  Unfortunately, he cannot take statins due to worsening myalgias.  I think would be very reasonable to target an LDL less than 100 which is aggressive therapy and I think we could accomplish that with a nonstatin alternative such as ezetimibe.  Recommend starting ezetimibe 10 mg daily.  We will repeat lipids in about 3 months and follow-up at that time.

## 2020-07-11 ENCOUNTER — Other Ambulatory Visit: Payer: Self-pay

## 2020-07-11 ENCOUNTER — Other Ambulatory Visit: Payer: No Typology Code available for payment source

## 2020-07-11 DIAGNOSIS — E785 Hyperlipidemia, unspecified: Secondary | ICD-10-CM

## 2020-07-12 ENCOUNTER — Telehealth: Payer: Self-pay | Admitting: Internal Medicine

## 2020-07-12 LAB — LIPID PANEL
Chol/HDL Ratio: 4.6 ratio (ref 0.0–5.0)
Cholesterol, Total: 199 mg/dL (ref 100–199)
HDL: 43 mg/dL (ref >39)
LDL Chol Calc (NIH): 138 mg/dL — ABNORMAL HIGH (ref 0–99)
Triglycerides: 98 mg/dL (ref 0–149)
VLDL Cholesterol Cal: 18 mg/dL (ref 5–40)

## 2020-07-12 NOTE — Telephone Encounter (Signed)
Spoke with pt, he would like to change his appointment to a virtual visit. Appointment changed to virtual visit.

## 2020-07-12 NOTE — Telephone Encounter (Signed)
Pt called in and stated his labs came back good and lower then last time and would like to know if it would be ok to cancel the appt on the 10th. ?     Best number   [phone]

## 2020-07-15 ENCOUNTER — Telehealth (INDEPENDENT_AMBULATORY_CARE_PROVIDER_SITE_OTHER): Payer: No Typology Code available for payment source | Admitting: Internal Medicine

## 2020-07-15 ENCOUNTER — Ambulatory Visit: Payer: No Typology Code available for payment source | Admitting: Internal Medicine

## 2020-07-15 ENCOUNTER — Encounter: Payer: Self-pay | Admitting: Internal Medicine

## 2020-07-15 VITALS — Ht 67.0 in | Wt 152.0 lb

## 2020-07-15 DIAGNOSIS — M791 Myalgia, unspecified site: Secondary | ICD-10-CM

## 2020-07-15 DIAGNOSIS — G114 Hereditary spastic paraplegia: Secondary | ICD-10-CM

## 2020-07-15 DIAGNOSIS — E785 Hyperlipidemia, unspecified: Secondary | ICD-10-CM | POA: Diagnosis not present

## 2020-07-15 DIAGNOSIS — T466X5A Adverse effect of antihyperlipidemic and antiarteriosclerotic drugs, initial encounter: Secondary | ICD-10-CM

## 2020-07-15 MED FILL — NEUPRO 1 MG/24 HR PATCH: 1 | 30 days supply | Qty: 30 | Fill #1

## 2020-07-15 NOTE — Patient Instructions (Signed)
Medication Instructions:  Your physician recommends that you continue on your current medications as directed. Please refer to the Current Medication list given to you today.  *If you need a refill on your cardiac medications before your next appointment, please call your pharmacy*   Lab Work: Your physician recommends that you return for lab work in: 3 months for lipid profile.   If you have labs (blood work) drawn today and your tests are completely normal, you will receive your results only by: Marland Kitchen MyChart Message (if you have MyChart) OR . A paper copy in the mail If you have any lab test that is abnormal or we need to change your treatment, we will call you to review the results.  Follow-Up: At [facility], you and your health needs are our priority.  As part of our continuing mission to provide you with exceptional heart care, we have created designated Provider Care Teams.  These Care Teams include your primary Cardiologist (physician) and Advanced Practice Providers (APPs -  Physician Assistants and Nurse Practitioners) who all work together to provide you with the care you need, when you need it.  We recommend signing up for the patient portal called "MyChart".  Sign up information is provided on this After Visit Summary.  MyChart is used to connect with patients for Virtual Visits (Telemedicine).  Patients are able to view lab/test results, encounter notes, upcoming appointments, etc.  Non-urgent messages can be sent to your provider as well.   To learn more about what you can do with MyChart, go to [email or web address].    Your next appointment:   3 month(s)  The format for your next appointment:   In Person  Provider:   K. [place] Hilty, MD

## 2020-07-15 NOTE — Progress Notes (Signed)
Virtual Visit via Telephone Note   This visit type was conducted due to national recommendations for restrictions regarding the COVID-19 Pandemic (e.g. social distancing) in an effort to limit this patient's exposure and mitigate transmission in our community.  Due to his co-morbid illnesses, this patient is at least at moderate risk for complications without adequate follow up.  This format is felt to be most appropriate for this patient at this time.  The patient did not have access to video technology/had technical difficulties with video requiring transitioning to audio format only (telephone).  All issues noted in this document were discussed and addressed.  No physical exam could be performed with this format.  Please refer to the patient's chart for his  consent to telehealth for [facility].   Date:  07/15/2020   ID:  Terry Howard, DOB [birth year], MRN [id number] The patient was identified using 2 identifiers.  Evaluation Performed:  Follow-Up Visit  Patient Location:  [address] [place] [address]  Provider location:   [address] [place], [place] [address]  PCP:  Mayer Masker, PA-C  Cardiologist:  No primary care provider on file. Electrophysiologist:  None   Chief Complaint:  Follow-up lipids  History of Present Illness:    Terry Howard is a 64 y.o. male who presents via audio/video conferencing for a telehealth visit today.  [name] is a pleasant 64 year old male kindly referred to the lipid clinic for evaluation and management of dyslipidemia.  History is significant for unfortunate hereditary spastic paraparesis, dyslipidemia and other neurologic conditions, who presents for evaluation and management of dyslipidemia.  [name] has some longstanding dyslipidemia but no known coronary disease.  There is a questionable family history of heart disease having noted that his mother died with cancer but had high cholesterol and his  father having died of complications related to alcohol use.  Most recently [name] had a cholesterol profile showing total cholesterol 225, HDL 39, LDL 165 and triglycerides 283.  He reports overall pretty healthy diet, however is not able to exercise with the lower extremities to do a lot of aerobic activity but does exercise regularly of his upper extremities.  He has no history of hypertension, diabetes, tobacco abuse or other cardiovascular risk factors.  Weight is actually appropriate and blood pressure is well controlled today.  Unfortunately, Mr. Sitzer has been on a number of different statin medications in the past all of which caused worsening myalgias and spasticity of his lower extremities.  He reported he would not take any additional statins.  He also brought a list of possible statin alternatives with him to the office today to review.  07/15/2020  [name] is seen today via telephone follow-up.  He seems to have done well on ezetimibe without any significant side effects.  This has further lowered his cholesterol, with total cholesterol now 199, triglycerides 98, HDL 43 and LDL 138.  Ultimately I would recommend a target of less than 100.  We discussed other options and at this point he is hesitant to consider other treatments, particularly statins due to his spastic paraparesis.  The patient does not have symptoms concerning for COVID-19 infection (fever, chills, cough, or new SHORTNESS OF BREATH).    Prior CV studies:   The following studies were reviewed today:  Chart reviewed  PMHx:  Past Medical History:  Diagnosis Date  . Arthritis   . Chronic insomnia   . Chronic low back pain  Annular tear at the L2-3 and L4-5 levels 4  . Depression   . Gait disorder   . Hereditary spastic paraparesis (HCC)   . Hyperlipidemia   . Internal hemorrhoids   . 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 Neurogenic bladder   . RLS (restless legs syndrome)     Past Surgical  History:  Procedure Laterality Date  . APPENDECTOMY    . BREAST SURGERY Bilateral    cysts  . COLONOSCOPY    . HEMORRHOID BANDING    . KNEE ARTHROSCOPY     right  . SHOULDER SURGERY     Rotator cuff, left  . ULNAR TUNNEL RELEASE Left 09/10/2014   Procedure: LEFT CUBITAL TUNNEL RELEASE;  Surgeon: [name], MD;  Location: [facility];  Service: Orthopedics;  Laterality: Left;    FAMHx:  Family History  Problem Relation Age of Onset  . Cancer Mother        breast cancer  . Other Mother   . Hyperlipidemia Mother   . Stroke Father        Intracranial hemorrhage  . Other Brother   . Other Brother   . Other Brother   . Colon cancer Neg Hx   . Colon polyps Neg Hx   . Esophageal cancer Neg Hx   . Stomach cancer Neg Hx   . Rectal cancer Neg Hx     SOCHx:   reports that he has never smoked. He has never used smokeless tobacco. He reports current alcohol use of about 7.0 standard drinks of alcohol per week. He reports that he does not use drugs.  ALLERGIES:  Allergies  Allergen Reactions  . Prozac [Fluoxetine Hcl]     tolerate well due to SE -felt sluggish, tired, and anxious   . Pregabalin Other (See Comments)    Intolerance, somnolence  . Statins Other (See Comments)    Other reaction(s): Muscle Pain Other reaction(s): Muscle Pain Other reaction(s): Muscle Pain     MEDS:  Current Meds  Medication Sig  . acyclovir (ZOVIRAX) 800 MG tablet Take 1 tablet (800 mg total) by mouth daily.  . clonazePAM (KLONOPIN) 1 MG tablet TAKE 1 TABLET (1 MG TOTAL) BY MOUTH AT BEDTIME.  Marland Kitchen ezetimibe (ZETIA) 10 MG tablet Take 1 tablet (10 mg total) by mouth daily.  Marland Kitchen gabapentin (NEURONTIN) 300 MG capsule 1 capsule in the morning, 2 in the evening  . rOPINIRole (REQUIP) 3 MG tablet Take 1 tablet (3 mg total) by mouth at bedtime.  . Rotigotine 1 MG/24HR PT24 Place 1 patch (1 mg total) onto the skin daily.     ROS: Pertinent items noted in HPI and remainder of  comprehensive ROS otherwise negative.  Labs/Other Tests and Data Reviewed:    Recent Labs: No results found for requested labs within last 8760 hours.   Recent Lipid Panel Lab Results  Component Value Date/Time   CHOL 199 07/11/2020 09:48 AM   TRIG 98 07/11/2020 09:48 AM   HDL 43 07/11/2020 09:48 AM   CHOLHDL 4.6 07/11/2020 09:48 AM   CHOLHDL 4.8 05/16/2016 10:27 AM   LDLCALC 138 (H) 07/11/2020 09:48 AM    Wt Readings from Last 3 Encounters:  07/15/20 152 lb (68.9 kg)  06/09/20 151 lb (68.5 kg)  04/19/20 151 lb 12.8 oz (68.9 kg)     Exam:    Vital Signs:  Ht 5\' 7"  (1.702 m)   Wt 152 lb (68.9 kg)   BMI 23.81  kg/m    Exam not performed due to telephone visit  ASSESSMENT & PLAN:    1. Mixed dyslipidemia 2. Statin intolerant-myalgias 3. Hereditary spastic paraplegia  Mr. Doggett seems to be doing well on ezetimibe.  His lipids are improved however not quite to target LDL less than 100.  He wants to continue to work on diet and exercise to see if his numbers can improve further.  He is not interested in any statins due to his concern about worsening spastic paraparesis.  We could consider adding bempedoic acid which would help him reach target.  We could discuss this further with repeat lipids in about 6 months.  COVID-19 Education: The signs and symptoms of COVID-19 were discussed with the patient and how to seek care for testing (follow up with PCP or arrange E-visit).  The importance of social distancing was discussed today.  Patient Risk:   After full review of this patients clinical status, I feel that they are at least moderate risk at this time.  Time:   Today, I have spent 15 minutes with the patient with telehealth technology discussing dyslipidemia.     Medication Adjustments/Labs and Tests Ordered: Current medicines are reviewed at length with the patient today.  Concerns regarding medicines are outlined above.   Tests Ordered: Orders Placed This Encounter   Procedures  . Lipid panel    Medication Changes: No orders of the defined types were placed in this encounter.   Disposition:  in 6 month(s)  Chrystie Nose, MD, [facility], FACP  [place]  [facility]  Medical Director of the Advanced Lipid Disorders &  Cardiovascular Risk Reduction Clinic Diplomate of the American Board of Clinical Lipidology Attending Cardiologist  Direct Dial: [phone]  Fax: [phone]  Website:  [email or web address]  Chrystie Nose, MD  07/15/2020 1:52 PM

## 2020-07-18 MED FILL — EZETIMIBE 10 MG TABS: 10 | 90 days supply | Qty: 90 | Fill #1

## 2020-07-22 MED FILL — GABAPENTIN 300 MG CAPSULE: 300 | 90 days supply | Qty: 270 | Fill #1

## 2020-08-16 ENCOUNTER — Telehealth: Payer: Self-pay | Admitting: Physician Assistant

## 2020-08-16 ENCOUNTER — Other Ambulatory Visit: Payer: Self-pay | Admitting: Physician Assistant

## 2020-08-16 DIAGNOSIS — G2581 Restless legs syndrome: Secondary | ICD-10-CM

## 2020-08-16 MED ORDER — ROTIGOTINE 1 MG/24HR TD PT24: 1.0000 | MEDICATED_PATCH | Freq: Every day | TRANSDERMAL | 3 refills | Status: DC

## 2020-08-16 NOTE — Telephone Encounter (Signed)
rx refill sent to pharmacy. AS, CMA 

## 2020-08-17 MED FILL — NEUPRO 1 MG/24 HR PATCH: 1 | 30 days supply | Qty: 30 | Fill #0

## 2020-09-15 MED FILL — NEUPRO 1 MG/24 HR PATCH: 1 | 30 days supply | Qty: 30 | Fill #1

## 2020-09-21 ENCOUNTER — Ambulatory Visit: Payer: No Typology Code available for payment source | Admitting: Neurology

## 2020-09-28 ENCOUNTER — Telehealth: Payer: Self-pay | Admitting: Internal Medicine

## 2020-09-28 NOTE — Telephone Encounter (Signed)
Lab orders placed in the mail for patient.

## 2020-09-28 NOTE — Telephone Encounter (Signed)
Patient states he was sent lab orders, but lost them. He would like them sent again so he can have the lab work done at his PCP. He states he does not need a call back.

## 2020-10-10 ENCOUNTER — Other Ambulatory Visit: Payer: Self-pay

## 2020-10-10 ENCOUNTER — Other Ambulatory Visit: Payer: No Typology Code available for payment source

## 2020-10-11 ENCOUNTER — Other Ambulatory Visit: Payer: Self-pay | Admitting: Neurology

## 2020-10-11 DIAGNOSIS — F339 Major depressive disorder, recurrent, unspecified: Secondary | ICD-10-CM

## 2020-10-11 DIAGNOSIS — G4700 Insomnia, unspecified: Secondary | ICD-10-CM

## 2020-10-11 LAB — LIPID PANEL
Chol/HDL Ratio: 3.5 ratio (ref 0.0–5.0)
Cholesterol, Total: 173 mg/dL (ref 100–199)
HDL: 50 mg/dL (ref >39)
LDL Chol Calc (NIH): 107 mg/dL — ABNORMAL HIGH (ref 0–99)
Triglycerides: 84 mg/dL (ref 0–149)
VLDL Cholesterol Cal: 16 mg/dL (ref 5–40)

## 2020-10-11 MED FILL — clonazePAM 1 MG TABS: 1 | 90 days supply | Qty: 90 | Fill #0

## 2020-10-11 MED FILL — NEUPRO 1 MG/24 HR PATCH: 1 | 30 days supply | Qty: 30 | Fill #2

## 2020-10-31 ENCOUNTER — Other Ambulatory Visit: Payer: Self-pay | Admitting: Family Medicine

## 2020-10-31 DIAGNOSIS — B009 Herpesviral infection, unspecified: Secondary | ICD-10-CM

## 2020-11-02 ENCOUNTER — Telehealth: Payer: Self-pay | Admitting: Physician Assistant

## 2020-11-02 ENCOUNTER — Encounter: Payer: Self-pay | Admitting: Physician Assistant

## 2020-11-02 DIAGNOSIS — B009 Herpesviral infection, unspecified: Secondary | ICD-10-CM

## 2020-11-02 NOTE — Telephone Encounter (Signed)
Left voicemail for patient

## 2020-11-02 NOTE — Telephone Encounter (Signed)
Patient needs a prior authorization for acyclovir with [facility] outpatient pharmacy. Please advise, thanks.

## 2020-11-02 NOTE — Telephone Encounter (Signed)
No current insurance card on file. Please contact patient to obtain insurance card for PA. AS, CMA

## 2020-11-03 ENCOUNTER — Other Ambulatory Visit: Payer: Self-pay | Admitting: Physician Assistant

## 2020-11-03 ENCOUNTER — Telehealth: Payer: Self-pay | Admitting: Internal Medicine

## 2020-11-03 MED ORDER — ACYCLOVIR 800 MG PO TABS: 800.0000 mg | ORAL_TABLET | Freq: Every day | ORAL | 1 refills | Status: DC

## 2020-11-03 MED FILL — ACYCLOVIR 800 MG TABLET: 800 | 90 days supply | Qty: 90 | Fill #0

## 2020-11-03 NOTE — Telephone Encounter (Signed)
Spoke with [facility] and they advised patient does not need PA on Acyclovir but that he needs refill authorization. RX refill being sent to pharmacy. AS, CMA

## 2020-11-03 NOTE — Telephone Encounter (Signed)
3.31.22 LM to schedule  fu Lipid clinic appointment w/Dr. Rennis Golden in open slots he has in next couple of days. LP

## 2020-11-03 NOTE — Addendum Note (Signed)
Addended by: Sylvester Harder on: 11/03/2020 09:36 AM   Modules accepted: Orders

## 2020-11-13 MED FILL — Rotigotine TD Patch 24HR 1 MG/24HR: TRANSDERMAL | 30 days supply | Qty: 30 | Fill #0 | Status: AC

## 2020-11-14 ENCOUNTER — Other Ambulatory Visit (HOSPITAL_COMMUNITY): Payer: Self-pay

## 2020-11-30 ENCOUNTER — Encounter: Payer: No Typology Code available for payment source | Admitting: Physician Assistant

## 2020-12-06 ENCOUNTER — Encounter: Payer: Self-pay | Admitting: Physician Assistant

## 2020-12-06 ENCOUNTER — Ambulatory Visit (INDEPENDENT_AMBULATORY_CARE_PROVIDER_SITE_OTHER): Payer: No Typology Code available for payment source | Admitting: Physician Assistant

## 2020-12-06 ENCOUNTER — Other Ambulatory Visit: Payer: Self-pay

## 2020-12-06 ENCOUNTER — Other Ambulatory Visit (HOSPITAL_COMMUNITY): Payer: Self-pay

## 2020-12-06 VITALS — BP 113/71 | HR 76 | Temp 98.4°F | Ht 67.0 in | Wt 161.4 lb

## 2020-12-06 DIAGNOSIS — R5383 Other fatigue: Secondary | ICD-10-CM

## 2020-12-06 DIAGNOSIS — Z0000 Encounter for general adult medical examination without abnormal findings: Secondary | ICD-10-CM | POA: Diagnosis not present

## 2020-12-06 DIAGNOSIS — G114 Hereditary spastic paraplegia: Secondary | ICD-10-CM | POA: Diagnosis not present

## 2020-12-06 DIAGNOSIS — Z125 Encounter for screening for malignant neoplasm of prostate: Secondary | ICD-10-CM

## 2020-12-06 DIAGNOSIS — F39 Unspecified mood [affective] disorder: Secondary | ICD-10-CM

## 2020-12-06 MED ORDER — SERTRALINE HCL 25 MG PO TABS
25.0000 mg | ORAL_TABLET | Freq: Every day | ORAL | 1 refills | Status: DC
Start: 2020-12-06 — End: 2021-02-21
  Filled 2020-12-06: qty 30, 30d supply, fill #0
  Filled 2021-01-11: qty 30, 30d supply, fill #1

## 2020-12-06 NOTE — Progress Notes (Signed)
psa     Male physical   Impression and Recommendations:    1. Healthcare maintenance   2. Mood disorder ([place])   3. Hereditary spastic paraparesis ([place])   4. Fatigue, unspecified type   5. Screening for prostate cancer      1) Anticipatory Guidance: Skin CA prevention- recommend to use sunscreen when outside along with skin surveillance; eating a balanced and modest diet; physical activity at least 25 minutes per day or minimum of 150 min/ week moderate to intense activity.  2) Immunizations / Screenings / Labs:   All immunizations are up-to-date per recommendations or will be updated today if pt allows.    - Patient understands with dental and vision screens they will schedule independently.  - Will obtain CBC, CMP, HgA1c, TSH, PSA and vit D. Patient recently had lipid panel checked by cardiology. - UTD on colonoscopy, Hep C and HIV screenings.  - Patient reports obtained Tdap and Shingrix at [facility] ([place]), will request vaccine record.  3) Weight:  Recommend to continue to improve diet habits to improve overall feelings of well being and objective health data. Improve nutrient density of diet through increasing intake of fruits and vegetables and decreasing saturated fats, white flour products and refined sugars.   4) Healthcare Maintenance: -Continue to follow up with various specialists.  -Continue current medication regimen. Will start sertraline 25 mg for mood. -Continue to stay as active as possible.   -Follow up in 6 weeks for Mood- started med.    Orders Placed This Encounter  Procedures  . CBC w/Diff  . Comp Met (CMET)  . TSH  . HgB A1c  . Vitamin D (25 hydroxy)  . PSA  . PSA  . T4, free  . T3  . Specimen status report    Meds ordered this encounter  Medications  . sertraline (ZOLOFT) 25 MG tablet    Sig: Take 1 tablet (25 mg total) by mouth daily.    Dispense:  30 tablet    Refill:  1    Order Specific Question:   Supervising Provider     Answer:   Beatrice Lecher D [[id number]]     Return in about 6 weeks (around 01/17/2021) for Mood- started med.   Gross side effects, risk and benefits, and alternatives of medications discussed with patient.  Patient is aware that all medications have potential side effects and we are unable to predict every side effect or drug-drug interaction that may occur.  Expresses verbal understanding and consents to current therapy plan and treatment regimen.  Please see AVS handed out to patient at the end of our visit for further patient instructions/ counseling done pertaining to today's office visit.     Subjective:      CC: CPE   HPI: Terry Howard is a 65 y.o. male who presents to [facility] today for a yearly health maintenance exam.     Health Maintenance Summary  - Reviewed and updated, unless pt declines services.  Last Cologuard or Colonoscopy:   08/26/2017- repeat every 10 yrs Tobacco History Reviewed: Y, never been a smoker  Alcohol / drug use:    No concerns, no excessive use / no use Male history: STD concerns:   none Additional penile/ urinary concerns: none   Additional concerns beyond Health Maintenance issues:   Reports in the past tried Prozac for mood management and states did not feel like medication helped, unsure if maybe  did not give medication enough time but did not like how it made him feel. States has felt more sluggish and with less motivation so would like to start medication to see if his symptoms improve.     Immunization History  Administered Date(s) Administered  . Influenza,inj,Quad PF,6+ Mos 08/21/2018, 06/15/2019  . Influenza-Unspecified 06/14/2020  . PFIZER(Purple Top)SARS-COV-2 Vaccination 10/01/2019, 11/19/2019, 06/03/2020     Health Maintenance  Topic Date Due  . TETANUS/TDAP  08/06/2018  . COVID-19 Vaccine (4 - Booster for Pfizer series) 12/02/2020  . INFLUENZA VACCINE  03/06/2021  . COLONOSCOPY  (Pts 45-89yr Insurance coverage will need to be confirmed)  08/27/2027  . Hepatitis C Screening  Completed  . HIV Screening  Completed  . HPV VACCINES  Aged Out       Wt Readings from Last 3 Encounters:  12/06/20 161 lb 6.4 oz (73.2 kg)  07/15/20 152 lb (68.9 kg)  06/09/20 151 lb (68.5 kg)   BP Readings from Last 3 Encounters:  12/06/20 113/71  04/19/20 126/78  04/14/20 129/81   Pulse Readings from Last 3 Encounters:  12/06/20 76  06/09/20 72  04/19/20 74    Patient Active Problem List   Diagnosis Date Noted  . Internal and external prolapsed hemorrhoids 03/10/2019  . Anal skin tags 03/10/2019  . Constipation 03/10/2019  . Pain in left elbow 12/10/2018  . Cubital tunnel syndrome on left 11/06/2018  . Gum hypertrophy 06/06/2018  . Gum inflammation 06/06/2018  . Disability of walking 06/06/2018  . 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 Habitual snoring 11/26/2017  . Chronic pansinusitis 09/25/2017  . Deviated septum 09/05/2017  . Hypertrophy, nasal, turbinate 09/05/2017  . Perennial allergic rhinitis 09/05/2017  . Impingement syndrome of left shoulder 04/29/2017  . Chronic left shoulder pain 04/22/2017  . Strain of right hip adductor muscle 02/05/2017  . Statin intolerance 12/13/2016  . Vitamin D insufficiency 06/21/2016  . Elevated TSH 06/21/2016  . External hemorrhoids 05/18/2016  . Benign prostatic hyperplasia  L>R; without lower urinary tract symptoms 05/18/2016  . Restless legs syndrome (RLS) 02/15/2016  . Erectile dysfunction 02/15/2016  . h/o CLinically Signficant Anxiety 01/05/2014  . h/o Clinical depression 12/31/2013  . Depressive disorder 12/31/2013  . Hyperlipemia 12/18/2013  . Genital Herpes 11/18/2013  . Cannot sleep 11/18/2013  . Pain in soft tissues of limb 11/18/2013  . Insomnia 11/18/2013  . Abnormal findings on diagnostic imaging of abdomen 10/29/2013  . Hereditary spastic paraparesis ([place] 10/28/2013  . Polypharmacy  08/18/2013  . Other long term (current) drug therapy 08/18/2013  . Encounter for long-term (current) use of other medications 08/18/2013  . Chronic pain 08/11/2013  . Other specified disorders of urinary tract 07/09/2012  . Abnormality of gait 07/09/2012  . Lumbago 07/09/2012  . Disturbance of skin sensation 07/09/2012  . 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 Arthritis   . Chronic insomnia   . Chronic low back pain    Annular tear at the L2-3 and L4-5 levels 4  . Depression   . Gait disorder   . [place]   . Hyperlipidemia   . Internal hemorrhoids   . 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 Neurogenic bladder   . RLS (restless legs syndrome)     Past Surgical History:  Procedure Laterality Date  . APPENDECTOMY    . BREAST SURGERY Bilateral    cysts  . COLONOSCOPY    .  HEMORRHOID BANDING    . KNEE ARTHROSCOPY     right  . SHOULDER SURGERY     Rotator cuff, left  . ULNAR TUNNEL RELEASE Left 09/10/2014   Procedure: LEFT CUBITAL TUNNEL RELEASE;  Surgeon: Marianna Payment, MD;  Location: [place];  Service: Orthopedics;  Laterality: Left;    Family History  Problem Relation Age of Onset  . Cancer Mother        breast cancer  . Other Mother   . Hyperlipidemia Mother   . Stroke Father        Intracranial hemorrhage  . Other Brother   . Other Brother   . Other Brother   . Colon cancer Neg Hx   . Colon polyps Neg Hx   . Esophageal cancer Neg Hx   . Stomach cancer Neg Hx   . Rectal cancer Neg Hx     Social History   Substance and Sexual Activity  Drug Use No  ,  Social History   Substance and Sexual Activity  Alcohol Use Yes  . Alcohol/week: 7.0 standard drinks  . Types: 7 Cans of beer per week   Comment: Consumes alcohol on occasion  ,  Social History   Tobacco Use  Smoking Status Never Smoker  Smokeless Tobacco Never Used  ,  Social History    Substance and Sexual Activity  Sexual Activity Yes  . Birth control/protection: Other-see comments   Comment: vasectomy    Patient's Medications  New Prescriptions   SERTRALINE (ZOLOFT) 25 MG TABLET    Take 1 tablet (25 mg total) by mouth daily.  Previous Medications   ACYCLOVIR (ZOVIRAX) 800 MG TABLET    TAKE 1 TABLET (800 MG TOTAL) BY MOUTH DAILY.   CLONAZEPAM (KLONOPIN) 1 MG TABLET    TAKE 1 TABLET (1 MG TOTAL) BY MOUTH AT BEDTIME.   EZETIMIBE (ZETIA) 10 MG TABLET    TAKE 1 TABLET (10 MG TOTAL) BY MOUTH DAILY.   GABAPENTIN (NEURONTIN) 300 MG CAPSULE    TAKE 1 CAPSULE IN THE MORNING AND 2 CAPSULES IN THE EVENING   ROPINIROLE (REQUIP) 3 MG TABLET    TAKE 1 TABLET (3 MG TOTAL) BY MOUTH AT BEDTIME.   ROTIGOTINE 1 MG/24HR PT24    PLACE 1 PATCH (1 MG TOTAL) ONTO THE SKIN DAILY.  Modified Medications   No medications on file  Discontinued Medications   No medications on file    Prozac [fluoxetine hcl], Pregabalin, and Statins  Review of Systems: General:   Denies fever, chills, unexplained weight loss.  Optho/Auditory:   Denies visual changes, blurred vision/LOV Respiratory:   Denies SOB, DOE more than baseline levels.   Cardiovascular:   Denies chest pain, palpitations, new onset peripheral edema  Gastrointestinal:   Denies nausea, vomiting, diarrhea.  Genitourinary: Denies dysuria, freq/ urgency, flank pain  Endocrine:     Denies hot or cold intolerance, polyuria, polydipsia. Musculoskeletal:   Denies unexplained myalgias, joint swelling, unexplained arthralgias. Skin:  Denies rash, suspicious lesions Neurological:     Denies dizziness, headache  Psychiatric/Behavioral:   Denies mood delusions, suicidal or homicidal ideations, hallucinations    Objective:     Blood pressure 113/71, pulse 76, temperature 98.4 F (36.9 C), height '5\' 7"'  (1.702 m), weight 161 lb 6.4 oz (73.2 kg), SpO2 97 %. Body mass index is 25.28 kg/m. General Appearance:    Alert, cooperative, no  distress, appears stated age  Head:    Normocephalic, without obvious abnormality, atraumatic  Eyes:  PERRL, conjunctiva/corneas clear, EOM's intact, both eyes  Ears:    Normal TM's and external ear canals, both ears  Nose:   Nares normal, septum midline, mucosa normal, no drainage    or sinus tenderness  Throat:   Lips w/o lesion, mucosa moist, and tongue normal; teeth and gums normal  Neck:   Supple, symmetrical, trachea midline, no adenopathy;    thyroid:  no enlargement/tenderness/nodules; no carotid   bruit or JVD  Back:     Symmetric, no curvature, ROM normal, no CVA tenderness  Lungs:     Clear to auscultation bilaterally, respirations unlabored, no Wh/ R/ R  Chest Wall:    No tenderness or gross deformity; normal excursion   Heart:    Regular rate and rhythm, S1 and S2 normal, no murmur, rub   or gallop  Abdomen:     Soft, non-tender, bowel sounds active all four quadrants, No G/R/R, no masses, no organomegaly  Genitalia:   Deferred.  Rectal:   Deferred.  Extremities:   Extremities normal, atraumatic, no cyanosis or gross edema  Pulses:   2+ and symmetric all extremities  Skin:   Warm, dry, Skin color, texture, turgor normal, skin erythema at sites of transdermal patch of bilateral thighs  M-Sk:   Ambulates * 4 w/ walking assistance (uses cane), abnormal gait  Neurologic:   CNII-XII grossly intact, spastic paraplegia of bilateral lower extremity Psych:  No HI/SI, judgement and insight good, Euthymic mood. Full Affect.

## 2020-12-06 NOTE — Patient Instructions (Signed)

## 2020-12-07 ENCOUNTER — Telehealth: Payer: Self-pay | Admitting: Physician Assistant

## 2020-12-07 LAB — CBC WITH DIFFERENTIAL/PLATELET
Basophils Absolute: 0 10*3/uL (ref 0.0–0.2)
Basos: 1 %
EOS (ABSOLUTE): 0.1 10*3/uL (ref 0.0–0.4)
Eos: 1 %
Hematocrit: 48.8 % (ref 37.5–51.0)
Hemoglobin: 16.6 g/dL (ref 13.0–17.7)
Immature Grans (Abs): 0 10*3/uL (ref 0.0–0.1)
Immature Granulocytes: 0 %
Lymphocytes Absolute: 1.9 10*3/uL (ref 0.7–3.1)
Lymphs: 30 %
MCH: 30.2 pg (ref 26.6–33.0)
MCHC: 34 g/dL (ref 31.5–35.7)
MCV: 89 fL (ref 79–97)
Monocytes Absolute: 0.6 10*3/uL (ref 0.1–0.9)
Monocytes: 9 %
Neutrophils Absolute: 3.8 10*3/uL (ref 1.4–7.0)
Neutrophils: 59 %
Platelets: 244 10*3/uL (ref 150–450)
RBC: 5.49 x10E6/uL (ref 4.14–5.80)
RDW: 13 % (ref 11.6–15.4)
WBC: 6.5 10*3/uL (ref 3.4–10.8)

## 2020-12-07 LAB — HEMOGLOBIN A1C
Est. average glucose Bld gHb Est-mCnc: 100 mg/dL
Hgb A1c MFr Bld: 5.1 % (ref 4.8–5.6)

## 2020-12-07 LAB — COMPREHENSIVE METABOLIC PANEL
ALT: 24 IU/L (ref 0–44)
AST: 21 IU/L (ref 0–40)
Albumin/Globulin Ratio: 2.1 (ref 1.2–2.2)
Albumin: 4.6 g/dL (ref 3.8–4.8)
Alkaline Phosphatase: 105 IU/L (ref 44–121)
BUN/Creatinine Ratio: 22 (ref 10–24)
BUN: 24 mg/dL (ref 8–27)
Bilirubin Total: 0.6 mg/dL (ref 0.0–1.2)
CO2: 21 mmol/L (ref 20–29)
Calcium: 9.4 mg/dL (ref 8.6–10.2)
Chloride: 103 mmol/L (ref 96–106)
Creatinine, Ser: 1.1 mg/dL (ref 0.76–1.27)
Globulin, Total: 2.2 g/dL (ref 1.5–4.5)
Glucose: 88 mg/dL (ref 65–99)
Potassium: 4.3 mmol/L (ref 3.5–5.2)
Sodium: 142 mmol/L (ref 134–144)
Total Protein: 6.8 g/dL (ref 6.0–8.5)
eGFR: 75 mL/min/{1.73_m2} (ref >59)

## 2020-12-07 LAB — PSA: Prostate Specific Ag, Serum: 3.8 ng/mL (ref 0.0–4.0)

## 2020-12-07 LAB — TSH: TSH: 7.57 u[IU]/mL — ABNORMAL HIGH (ref 0.450–4.500)

## 2020-12-07 LAB — VITAMIN D 25 HYDROXY (VIT D DEFICIENCY, FRACTURES): Vit D, 25-Hydroxy: 29.3 ng/mL — ABNORMAL LOW (ref 30.0–100.0)

## 2020-12-07 NOTE — Telephone Encounter (Signed)
Labs have been added. AS, CMA 

## 2020-12-07 NOTE — Telephone Encounter (Signed)
-----   Message from Maritza Abonza, PA-C sent at 12/07/2020  7:58 AM EDT ----- Please call [facility] and add free T4 and T3.  Thank you, Maritza   

## 2020-12-07 NOTE — Telephone Encounter (Signed)
-----   Message from Mayer Masker, [place] sent at 12/07/2020  7:58 AM EDT ----- Please call [facility] and add free T4 and T3.  Thank you, Kandis Cocking

## 2020-12-08 ENCOUNTER — Telehealth: Payer: Self-pay | Admitting: Physician Assistant

## 2020-12-08 LAB — SPECIMEN STATUS REPORT

## 2020-12-08 LAB — T3: T3, Total: 136 ng/dL (ref 71–180)

## 2020-12-08 LAB — T4, FREE: Free T4: 1.49 ng/dL (ref 0.82–1.77)

## 2020-12-08 NOTE — Telephone Encounter (Signed)
error 

## 2020-12-09 ENCOUNTER — Telehealth: Payer: Self-pay | Admitting: Physician Assistant

## 2020-12-09 NOTE — Telephone Encounter (Signed)
Patient is returning a call he missed about his lab results. Thanks.

## 2020-12-12 ENCOUNTER — Other Ambulatory Visit: Payer: Self-pay | Admitting: Physician Assistant

## 2020-12-12 ENCOUNTER — Other Ambulatory Visit (HOSPITAL_COMMUNITY): Payer: Self-pay

## 2020-12-12 DIAGNOSIS — G2581 Restless legs syndrome: Secondary | ICD-10-CM

## 2020-12-12 MED ORDER — NEUPRO 1 MG/24HR TD PT24
1.0000 | MEDICATED_PATCH | Freq: Every day | TRANSDERMAL | 3 refills | Status: DC
  Filled 2020-12-12: qty 30, 30d supply, fill #0
  Filled 2021-01-11: qty 30, 30d supply, fill #1
  Filled 2021-02-12: qty 30, 30d supply, fill #2

## 2020-12-14 ENCOUNTER — Other Ambulatory Visit (HOSPITAL_COMMUNITY): Payer: Self-pay

## 2021-01-09 ENCOUNTER — Telehealth: Payer: No Typology Code available for payment source | Admitting: Emergency Medicine

## 2021-01-09 DIAGNOSIS — J069 Acute upper respiratory infection, unspecified: Secondary | ICD-10-CM | POA: Diagnosis not present

## 2021-01-09 MED ORDER — DOXYCYCLINE HYCLATE 100 MG PO CAPS: 100.0000 mg | ORAL_CAPSULE | Freq: Two times a day (BID) | ORAL | 0 refills | Status: AC

## 2021-01-09 MED ORDER — BENZONATATE 100 MG PO CAPS: 100.0000 mg | ORAL_CAPSULE | Freq: Three times a day (TID) | ORAL | 0 refills | Status: DC | PRN

## 2021-01-09 NOTE — Progress Notes (Signed)
We are sorry that you are not feeling well.  Here is how we plan to help!  Based on your presentation I believe you most likely have A cough due to bacteria.  When patients have a fever and a productive cough with a change in color or increased sputum production, we are concerned about bacterial bronchitis.  If left untreated it can progress to pneumonia.  If your symptoms do not improve with your treatment plan it is important that you contact your provider.   I have prescribed Doxycycline 100 mg twice a day for 7 days     In addition you may use A prescription cough medication called Tessalon Perles 100mg. You may take 1-2 capsules every 8 hours as needed for your cough.  From your responses in the eVisit questionnaire you describe inflammation in the upper respiratory tract which is causing a significant cough.  This is commonly called Bronchitis and has four common causes:    Allergies  Viral Infections  Acid Reflux  Bacterial Infection Allergies, viruses and acid reflux are treated by controlling symptoms or eliminating the cause. An example might be a cough caused by taking certain blood pressure medications. You stop the cough by changing the medication. Another example might be a cough caused by acid reflux. Controlling the reflux helps control the cough.  USE OF BRONCHODILATOR ("RESCUE") INHALERS: There is a risk from using your bronchodilator too frequently.  The risk is that over-reliance on a medication which only relaxes the muscles surrounding the breathing tubes can reduce the effectiveness of medications prescribed to reduce swelling and congestion of the tubes themselves.  Although you feel brief relief from the bronchodilator inhaler, your asthma may actually be worsening with the tubes becoming more swollen and filled with mucus.  This can delay other crucial treatments, such as oral steroid medications. If you need to use a bronchodilator inhaler daily, several times per day,  you should discuss this with your provider.  There are probably better treatments that could be used to keep your asthma under control.     HOME CARE . Only take medications as instructed by your medical team. . Complete the entire course of an antibiotic. . Drink plenty of fluids and get plenty of rest. . Avoid close contacts especially the very young and the elderly . Cover your mouth if you cough or cough into your sleeve. . Always remember to wash your hands . A steam or ultrasonic humidifier can help congestion.   GET HELP RIGHT AWAY IF: . You develop worsening fever. . You become short of breath . You cough up blood. . Your symptoms persist after you have completed your treatment plan MAKE SURE YOU   Understand these instructions.  Will watch your condition.  Will get help right away if you are not doing well or get worse.  Your e-visit answers were reviewed by a board certified advanced clinical practitioner to complete your personal care plan.  Depending on the condition, your plan could have included both over the counter or prescription medications. If there is a problem please reply  once you have received a response from your provider. Your safety is important to us.  If you have drug allergies check your prescription carefully.    You can use MyChart to ask questions about today's visit, request a non-urgent call back, or ask for a work or school excuse for 24 hours related to this e-Visit. If it has been greater than 24 hours you will   need to follow up with your provider, or enter a new e-Visit to address those concerns. You will get an e-mail in the next two days asking about your experience.  I hope that your e-visit has been valuable and will speed your recovery. Thank you for using e-visits.  Approximately 5 minutes was spent documenting and reviewing patient's chart.  

## 2021-01-11 ENCOUNTER — Other Ambulatory Visit (HOSPITAL_COMMUNITY): Payer: Self-pay

## 2021-01-11 MED FILL — Ezetimibe Tab 10 MG: ORAL | 90 days supply | Qty: 90 | Fill #0 | Status: AC

## 2021-01-13 ENCOUNTER — Other Ambulatory Visit: Payer: Self-pay | Admitting: Physician Assistant

## 2021-01-13 DIAGNOSIS — R7989 Other specified abnormal findings of blood chemistry: Secondary | ICD-10-CM

## 2021-01-13 DIAGNOSIS — E559 Vitamin D deficiency, unspecified: Secondary | ICD-10-CM

## 2021-01-13 DIAGNOSIS — E785 Hyperlipidemia, unspecified: Secondary | ICD-10-CM

## 2021-01-13 DIAGNOSIS — Z0000 Encounter for general adult medical examination without abnormal findings: Secondary | ICD-10-CM

## 2021-01-16 ENCOUNTER — Other Ambulatory Visit: Payer: No Typology Code available for payment source

## 2021-01-17 ENCOUNTER — Ambulatory Visit: Payer: No Typology Code available for payment source | Admitting: Physician Assistant

## 2021-01-23 ENCOUNTER — Other Ambulatory Visit: Payer: Self-pay | Admitting: Neurology

## 2021-01-23 ENCOUNTER — Other Ambulatory Visit (HOSPITAL_COMMUNITY): Payer: Self-pay

## 2021-01-23 MED ORDER — ROPINIROLE HCL 3 MG PO TABS
3.0000 mg | ORAL_TABLET | Freq: Every day | ORAL | 1 refills | Status: DC
  Filled 2021-01-23: qty 90, 90d supply, fill #0
  Filled 2021-04-11: qty 90, 90d supply, fill #1

## 2021-02-08 ENCOUNTER — Ambulatory Visit: Payer: No Typology Code available for payment source | Admitting: Physician Assistant

## 2021-02-13 ENCOUNTER — Other Ambulatory Visit (HOSPITAL_COMMUNITY): Payer: Self-pay

## 2021-02-21 ENCOUNTER — Ambulatory Visit (INDEPENDENT_AMBULATORY_CARE_PROVIDER_SITE_OTHER): Payer: No Typology Code available for payment source | Admitting: Physician Assistant

## 2021-02-21 ENCOUNTER — Encounter: Payer: Self-pay | Admitting: Physician Assistant

## 2021-02-21 ENCOUNTER — Other Ambulatory Visit: Payer: Self-pay

## 2021-02-21 VITALS — BP 127/86 | HR 68 | Temp 98.2°F | Ht 67.0 in | Wt 158.0 lb

## 2021-02-21 DIAGNOSIS — M7989 Other specified soft tissue disorders: Secondary | ICD-10-CM | POA: Diagnosis not present

## 2021-02-21 DIAGNOSIS — G114 Hereditary spastic paraplegia: Secondary | ICD-10-CM

## 2021-02-21 DIAGNOSIS — F39 Unspecified mood [affective] disorder: Secondary | ICD-10-CM

## 2021-02-21 NOTE — Progress Notes (Signed)
Established Patient Office Visit  Subjective:  Patient ID: Terry Howard, male    DOB: 10-05-1955  Age: 65 y.o. MRN: [id number]  CC:  Chief Complaint  Patient presents with   Follow-up    Mood    HPI Terry Howard presents for follow up on mood management. Patient was started on sertraline and reports took medication for 6-7 weeks and did not tolerate due to side effects of sleep disturbance and fatigue.  Patient states was better since discontinuing medication.  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  Patient also reports falling backwards a few months ago and tried to stop himself with his hands, states since then his left hand has intermittent swelling. Denies warmth, fever or erythema.    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HCC)    Hyperlipidemia    Internal hemorrhoids    Mild carpal tunnel syndrome of right wrist    Neurogenic bladder    RLS (restless legs syndrome)     Past Surgical History:  Procedure Laterality Date   APPENDECTOMY     BREAST SURGERY Bilateral    cysts   COLONOSCOPY     HEMORRHOID BANDING     KNEE ARTHROSCOPY     right   SHOULDER SURGERY     Rotator cuff, left   ULNAR TUNNEL RELEASE Left 09/10/2014   Procedure: LEFT CUBITAL TUNNEL RELEASE;  Surgeon: Marianna Payment, MD;  Location: [facility];  Service: Orthopedics;  Laterality: Left;    Family History  Problem Relation Age of Onset   Cancer Mother        breast cancer   Other Mother    Hyperlipidemia Mother    Stroke Father        Intracranial hemorrhage   Other Brother    Other Brother    Other Brother    Colon cancer Neg Hx    Colon polyps Neg Hx    Esophageal cancer Neg Hx    Stomach cancer Neg Hx    Rectal cancer Neg  Hx     Social History   Socioeconomic History   Marital status: Married    Spouse name: Daisy   Number of children: 1   Years of education: 14   Highest education level: Not on file  Occupational History   Occupation: Unemployed    Comment: Disability  Tobacco Use   Smoking status: Never   Smokeless tobacco: Never  Vaping Use   Vaping Use: Never used  Substance and Sexual Activity   Alcohol use: Yes    Alcohol/week: 7.0 standard drinks    Types: 7 Cans of beer per week    Comment: Consumes alcohol on occasion   Drug use: No   Sexual activity: Yes    Birth control/protection: Other-see comments    Comment: vasectomy  Other Topics Concern   Not on file  Social History Narrative   Lives with wife Investment banker, corporate) and son   Disabled   Caffeine use: Drinks coffee sometimes in the am   Right handed   occ EtOH   Non-smoker (never)   No drugs   Social Determinants of Radio broadcast assistant Strain: Not on file  Food Insecurity: Not on file  Transportation Needs: Not on file  Physical Activity: Not on file  Stress: Not on file  Social Connections: Not on file  Intimate Partner Violence: Not on file    Outpatient Medications Prior to Visit  Medication Sig Dispense Refill   acyclovir (ZOVIRAX) 800 MG tablet TAKE 1 TABLET (800 MG TOTAL) BY MOUTH DAILY. 90 tablet 1   clonazePAM (KLONOPIN) 1 MG tablet TAKE 1 TABLET (1 MG TOTAL) BY MOUTH AT BEDTIME. 90 tablet 1   ezetimibe (ZETIA) 10 MG tablet TAKE 1 TABLET (10 MG TOTAL) BY MOUTH DAILY. 90 tablet 3   gabapentin (NEURONTIN) 300 MG capsule TAKE 1 CAPSULE IN THE MORNING AND 2 CAPSULES IN THE EVENING 270 capsule 3   rOPINIRole (REQUIP) 3 MG tablet Take 1 tablet (3 mg total) by mouth at bedtime. 90 tablet 1   Rotigotine (NEUPRO) 1 MG/24HR PT24 Place 1 patch (1 mg total) onto the skin daily. 30 patch 3   benzonatate (TESSALON) 100 MG capsule Take 1-2 capsules (100-200 mg total) by mouth 3 (three) times daily as needed. 20 capsule 0    sertraline (ZOLOFT) 25 MG tablet Take 1 tablet (25 mg total) by mouth daily. 30 tablet 1   No facility-administered medications prior to visit.    Allergies  Allergen Reactions   Prozac [Fluoxetine Hcl]     tolerate well due to SE -felt sluggish, tired, and anxious    Pregabalin Other (See Comments)    Intolerance, somnolence   Statins Other (See Comments)    Other reaction(s): Muscle Pain Other reaction(s): Muscle Pain Other reaction(s): Muscle Pain     ROS Review of Systems A fourteen system review of systems was performed and found to be positive as per HPI.   Objective:    Physical Exam General:  Well Developed, well nourished, appropriate for stated age.  Neuro:  Alert and oriented,  extra-ocular muscles intact  HEENT:  Normocephalic, atraumatic, neck supple Skin:  no gross rash, warm, pink. Cardiac:  RRR, S1 S2 Respiratory:  ECTA B/L, Not using accessory muscles, speaking in full sentences- unlabored. MSK: good strength and ROM of both hands, some DIP and PIP thickening noted, no deformity, negative Finkelstein sign b/l Vascular:  Ext warm, no cyanosis apprec.; no edema  Psych:  No HI/SI, judgement and insight good, Euthymic mood. Full Affect.  BP 127/86   Pulse 68   Temp 98.2 F (36.8 C)   Ht _0  (1.702 m)   Wt 158 lb (71.7 kg)   SpO2 98%   BMI 24.75 kg/m  Wt Readings from Last 3 Encounters:  02/21/21 158 lb (71.7 kg)  12/06/20 161 lb 6.4 oz (73.2 kg)  07/15/20 152 lb (68.9 kg)     Health Maintenance Due  Topic Date Due   Pneumococcal Vaccine 21-22 Years old (1 - PCV) Never done   Zoster Vaccines- Shingrix (1 of 2) Never done   TETANUS/TDAP  08/06/2018   COVID-19 Vaccine (4 - Booster for Pfizer series) 09/03/2020    There are no preventive care reminders to display for this patient.  Lab Results  Component Value Date   TSH 7.570 (H) 12/06/2020   Lab Results  Component Value Date   WBC 6.5 12/06/2020   HGB 16.6 12/06/2020   HCT 48.8  12/06/2020   MCV 89 12/06/2020   PLT 244 12/06/2020   Lab Results  Component Value Date   NA 142 12/06/2020   K 4.3 12/06/2020   CO2  21 12/06/2020   GLUCOSE 88 12/06/2020   BUN 24 12/06/2020   CREATININE 1.10 12/06/2020   BILITOT 0.6 12/06/2020   ALKPHOS 105 12/06/2020   AST 21 12/06/2020   ALT 24 12/06/2020   PROT 6.8 12/06/2020   ALBUMIN 4.6 12/06/2020   CALCIUM 9.4 12/06/2020   EGFR 75 12/06/2020   Lab Results  Component Value Date   CHOL 173 10/10/2020   Lab Results  Component Value Date   HDL 50 10/10/2020   Lab Results  Component Value Date   LDLCALC 107 (H) 10/10/2020   Lab Results  Component Value Date   TRIG 84 10/10/2020   Lab Results  Component Value Date   CHOLHDL 3.5 10/10/2020   Lab Results  Component Value Date   HGBA1C 5.1 12/06/2020      Assessment & Plan:   Problem List Items Addressed This Visit       Nervous and Auditory   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 -Patient has tried fluoxetine and sertraline and unable to tolerate due to side effects. -Will continue to monitor.  Hereditary spastic paraparesis: -Followed by Neurology. -On Neupro and Gabapentin.  -Will continue to monitor.  Swelling of left hand: -Discussed with patient likely MSK related secondary to hand injury.  -Recommend conservative therapy such as using topical anti-inflammatory and if symptoms fail to improve or worsen recommend further evaluation with imaging studies and/or labs (CRP, ESR, RA, anti-ccp, ANA, uric acid).  No orders of the defined types were placed in this encounter.   Follow-up: Return in about 4 months (around 06/24/2021) for HLD, vit D and FBW (lipid panel, cmp, vit d).   Note:  This note was prepared with assistance of Dragon voice recognition software. Occasional wrong-word or  sound-a-like substitutions may have occurred due to the inherent limitations of voice recognition software.  Lorrene Reid, PA-C

## 2021-03-08 ENCOUNTER — Encounter: Payer: Self-pay | Admitting: Neurology

## 2021-03-08 ENCOUNTER — Other Ambulatory Visit (HOSPITAL_COMMUNITY): Payer: Self-pay

## 2021-03-08 ENCOUNTER — Ambulatory Visit: Payer: No Typology Code available for payment source | Admitting: Neurology

## 2021-03-08 VITALS — BP 122/84 | HR 76 | Ht 67.0 in | Wt 157.1 lb

## 2021-03-08 DIAGNOSIS — G2581 Restless legs syndrome: Secondary | ICD-10-CM | POA: Diagnosis not present

## 2021-03-08 DIAGNOSIS — G114 Hereditary spastic paraplegia: Secondary | ICD-10-CM | POA: Diagnosis not present

## 2021-03-08 MED ORDER — NEUPRO 3 MG/24HR TD PT24
1.0000 | MEDICATED_PATCH | Freq: Every day | TRANSDERMAL | 5 refills | Status: DC
  Filled 2021-03-08: qty 30, 30d supply, fill #0
  Filled 2021-04-10: qty 30, 30d supply, fill #1
  Filled 2021-05-19: qty 30, 30d supply, fill #2
  Filled 2021-07-13: qty 30, 30d supply, fill #3
  Filled 2021-09-25: qty 30, 30d supply, fill #4

## 2021-03-08 NOTE — Progress Notes (Signed)
Reason for visit: Hereditary spastic paraparesis, restless leg syndrome  Terry Howard is an 65 y.o. male  History of present illness:  Terry Howard is a 65 year old right-handed white male with a history of hereditary spastic paraparesis.  The patient has had a slow gradual change in his ability to ambulate but he is staying quite active.  He has a yard care business.  The patient is currently off of baclofen, this did not help him.  Botox injections in the legs were not beneficial.  He claims that he had an evaluation for a baclofen pump in the past but the injections did not benefit him and he did not pursue surgery.  He was given gabapentin but stopped the medication as it was not helpful.  He finds good benefit with his restless legs with the Neupro patch, he is only on a 1 mg patch currently.  He takes 3 mg of Requip at night as well.  The patient takes clonazepam at night.  If he is particularly active during the day, his restless leg symptoms may be worse.  The patient falls on occasion.  He returns to this office for an evaluation.  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HCC)    Hyperlipidemia    Internal hemorrhoids    Mild carpal tunnel syndrome of right wrist    Neurogenic bladder    RLS (restless legs syndrome)     Past Surgical History:  Procedure Laterality Date   APPENDECTOMY     BREAST SURGERY Bilateral    cysts   COLONOSCOPY     HEMORRHOID BANDING     KNEE ARTHROSCOPY     right   SHOULDER SURGERY     Rotator cuff, left   ULNAR TUNNEL RELEASE Left 09/10/2014   Procedure: LEFT CUBITAL TUNNEL RELEASE;  Surgeon: [name], MD;  Location: [facility];  Service: Orthopedics;  Laterality: Left;    Family History  Problem Relation Age of Onset   Cancer Mother        breast  cancer   Other Mother    Hyperlipidemia Mother    Stroke Father        Intracranial hemorrhage   Other Brother    Other Brother    Other Brother    Colon cancer Neg Hx    Colon polyps Neg Hx    Esophageal cancer Neg Hx    Stomach cancer Neg Hx    Rectal cancer Neg Hx     Social history:  reports that he has never smoked. He has never used smokeless tobacco. He reports current alcohol use of about 7.0 standard drinks of alcohol per week. He reports that he does not use drugs.    Allergies  Allergen Reactions   Prozac [Fluoxetine Hcl]     tolerate well due to SE -felt sluggish, tired, and anxious    Pregabalin Other (See Comments)    Intolerance, somnolence   Statins Other (See Comments)    Other reaction(s): Muscle Pain Other reaction(s): Muscle Pain Other reaction(s): Muscle Pain     Medications:  Prior to Admission medications   Medication Sig Start Date End Date Taking? Authorizing Provider  acyclovir (ZOVIRAX) 800 MG tablet TAKE 1 TABLET (800 MG  TOTAL) BY MOUTH DAILY. 11/03/20 11/03/21  Mayer Masker, PA-C  clonazePAM (KLONOPIN) 1 MG tablet TAKE 1 TABLET (1 MG TOTAL) BY MOUTH AT BEDTIME. 10/11/20 04/09/21  Huston Foley, MD  ezetimibe (ZETIA) 10 MG tablet TAKE 1 TABLET (10 MG TOTAL) BY MOUTH DAILY. 04/19/20 04/19/21  Hilty, Lisette Abu, MD  gabapentin (NEURONTIN) 300 MG capsule TAKE 1 CAPSULE IN THE MORNING AND 2 CAPSULES IN THE EVENING 04/05/20 04/05/21  York Spaniel, MD  rOPINIRole (REQUIP) 3 MG tablet Take 1 tablet (3 mg total) by mouth at bedtime. 01/23/21   York Spaniel, MD  Rotigotine (NEUPRO) 1 MG/24HR PT24 Place 1 patch (1 mg total) onto the skin daily. 12/12/20   Mayer Masker, PA-C    ROS:  Out of a complete 14 system review of symptoms, the patient complains only of the following symptoms, and all other reviewed systems are negative.  Walking difficulty Leg discomfort   Blood pressure 122/84, pulse 76, height 5\' 7"  (1.702 m), weight 157 lb 2 oz (71.3 kg),  SpO2 98 %.  Physical Exam  General: The patient is alert and cooperative at the time of the examination.  Skin: No significant peripheral edema is noted.   Neurologic Exam  Mental status: The patient is alert and oriented x 3 at the time of the examination. The patient has apparent normal recent and remote memory, with an apparently normal attention span and concentration ability.   Cranial nerves: Facial symmetry is present. Speech is normal, no aphasia or dysarthria is noted. Extraocular movements are full. Visual fields are full.  Motor: The patient has good strength in the upper extremities.  With the lower extremities, the patient has increased motor tone, diffuse 4/5 strength.  Sensory examination: Soft touch sensation is symmetric on the face, arms, and legs.  Coordination: The patient has good finger-nose-finger bilaterally.  The patient is not able to perform heel-to-shin on either side.  Gait and station: The patient has a slightly wide-based, diplegic gait, the patient walks with a quad cane.  Reflexes: Deep tendon reflexes are symmetric, but are brisk throughout.   Assessment/Plan:  1.  Hereditary spastic paraparesis  2.  Gait disorder  3.  Restless leg syndrome  The patient will be increased on the Neupro patch to 3 mg daily, hopefully he can stop the Requip with this increase.  In the past, he has not gained any benefit with use of AFO braces, he claims that a trial for a baclofen pump placement was not effective.  He will follow-up here in 6 months, in the future he can be seen through Dr. .  Marjory Lies MD 03/08/2021 8:51 AM  [facility] [address] [place], [place]  Phone [phone] Fax [phone]

## 2021-03-09 ENCOUNTER — Other Ambulatory Visit (HOSPITAL_COMMUNITY): Payer: Self-pay

## 2021-04-11 ENCOUNTER — Other Ambulatory Visit: Payer: Self-pay | Admitting: Neurology

## 2021-04-11 ENCOUNTER — Other Ambulatory Visit: Payer: Self-pay | Admitting: Internal Medicine

## 2021-04-11 ENCOUNTER — Other Ambulatory Visit (HOSPITAL_COMMUNITY): Payer: Self-pay

## 2021-04-11 DIAGNOSIS — G4700 Insomnia, unspecified: Secondary | ICD-10-CM

## 2021-04-11 DIAGNOSIS — F339 Major depressive disorder, recurrent, unspecified: Secondary | ICD-10-CM

## 2021-04-11 MED ORDER — EZETIMIBE 10 MG PO TABS
10.0000 mg | ORAL_TABLET | Freq: Every day | ORAL | 0 refills | Status: DC
  Filled 2021-04-11 (×2): qty 90, 90d supply, fill #0

## 2021-04-11 MED FILL — Acyclovir Tab 800 MG: ORAL | 90 days supply | Qty: 90 | Fill #0 | Status: AC

## 2021-04-12 ENCOUNTER — Other Ambulatory Visit (HOSPITAL_COMMUNITY): Payer: Self-pay

## 2021-04-12 MED ORDER — CLONAZEPAM 1 MG PO TABS
1.0000 mg | ORAL_TABLET | Freq: Every day | ORAL | 0 refills | Status: DC
  Filled 2021-04-12: qty 90, 90d supply, fill #0

## 2021-04-12 NOTE — Telephone Encounter (Signed)
Received refill request for clonazepam. Last OV was on 03/08/21.  Next OV is scheduled for 09/13/20 .  Last RX was written on 10/11/20 for 90 tabs.   [facility] has been reviewed.

## 2021-05-09 ENCOUNTER — Other Ambulatory Visit (HOSPITAL_COMMUNITY): Payer: Self-pay

## 2021-05-19 ENCOUNTER — Other Ambulatory Visit (HOSPITAL_COMMUNITY): Payer: Self-pay

## 2021-05-22 ENCOUNTER — Telehealth: Payer: Self-pay | Admitting: Neurology

## 2021-05-22 ENCOUNTER — Other Ambulatory Visit (HOSPITAL_COMMUNITY): Payer: Self-pay

## 2021-05-22 MED ORDER — DULOXETINE HCL 30 MG PO CPEP
ORAL_CAPSULE | ORAL | 3 refills | Status: DC
  Filled 2021-05-22: qty 60, 30d supply, fill #0

## 2021-05-22 NOTE — Telephone Encounter (Signed)
Pt called wanting to know if he could by any chance have a pain killer for the pain he is having with his legs. Pt requesting a call back.

## 2021-05-22 NOTE — Telephone Encounter (Signed)
I called the patient.  The patient is having discomfort in the feet that is present day and night.  He has been treated for restless leg syndrome but indicates that the discomfort is present at all times.  He has tried gabapentin in the past without much benefit.  I will give him a trial of Cymbalta at this time.  The patient wanted to go on oxycodone but indicated this is an addictive medication, we should try nonaddictive medications first.

## 2021-05-31 ENCOUNTER — Other Ambulatory Visit (HOSPITAL_COMMUNITY): Payer: Self-pay

## 2021-05-31 ENCOUNTER — Telehealth: Payer: Self-pay | Admitting: Neurology

## 2021-05-31 MED ORDER — GABAPENTIN 300 MG PO CAPS
ORAL_CAPSULE | ORAL | 1 refills | Status: DC
  Filled 2021-05-31: qty 270, 90d supply, fill #0

## 2021-05-31 NOTE — Telephone Encounter (Signed)
I called the patient, left a message.  The patient wishes to get back on the gabapentin.  I am fine with him doing this.  We will have him start taking 300 mg in the morning and 600 mg in the evening.

## 2021-05-31 NOTE — Telephone Encounter (Signed)
Pt called asking if he could be put back on the  gabapentin (NEURONTIN) 300 MG capsule. Pharmacy [facility].

## 2021-06-07 ENCOUNTER — Other Ambulatory Visit (HOSPITAL_COMMUNITY): Payer: Self-pay

## 2021-06-07 ENCOUNTER — Telehealth: Payer: Self-pay | Admitting: Physician Assistant

## 2021-06-07 DIAGNOSIS — G114 Hereditary spastic paraplegia: Secondary | ICD-10-CM

## 2021-06-07 MED ORDER — OXYBUTYNIN CHLORIDE 5 MG PO TABS
2.5000 mg | ORAL_TABLET | Freq: Three times a day (TID) | ORAL | 0 refills | Status: DC
  Filled 2021-06-07: qty 45, 30d supply, fill #0

## 2021-06-07 NOTE — Telephone Encounter (Signed)
Pt states he is having frequent urinary symptoms. Requesting med be sent to pharmacy to help. Per Anne Ng sending in Oxybutnin 2.5mg  TID. Patient is aware and was agreeable. AS,CMA

## 2021-06-14 ENCOUNTER — Ambulatory Visit (INDEPENDENT_AMBULATORY_CARE_PROVIDER_SITE_OTHER): Payer: No Typology Code available for payment source | Admitting: Physician Assistant

## 2021-06-14 ENCOUNTER — Encounter: Payer: Self-pay | Admitting: Physician Assistant

## 2021-06-14 ENCOUNTER — Other Ambulatory Visit: Payer: Self-pay

## 2021-06-14 VITALS — BP 119/69 | HR 78 | Temp 98.1°F | Ht 67.0 in | Wt 157.0 lb

## 2021-06-14 DIAGNOSIS — R350 Frequency of micturition: Secondary | ICD-10-CM

## 2021-06-14 NOTE — Progress Notes (Signed)
Established Patient Office Visit  Subjective:  Patient ID: Terry Howard, male    DOB: [birth year]  Age: 65 y.o. MRN: [id number]  CC:  Chief Complaint  Patient presents with   Follow-up    Mood    HPI LEW PROUT presents for follow up medication management. Patient was started on oxybutynin for urinary urgency and frequency. Patient reports urinary frequency has improved on intermittent basis. Due to hereditary spastic paraparesis sometimes is not able to get to the bathroom quick enough. States when first starting medication noticed a significant improvement with decreased urgency and frequency but the next day was not as effective and then seemed to be more effective. No palpitations, chest pain, altered mental status or mood changes.    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HCC)    Hyperlipidemia    Internal hemorrhoids    Mild carpal tunnel syndrome of right wrist    Neurogenic bladder    RLS (restless legs syndrome)     Past Surgical History:  Procedure Laterality Date   APPENDECTOMY     BREAST SURGERY Bilateral    cysts   COLONOSCOPY     HEMORRHOID BANDING     KNEE ARTHROSCOPY     right   SHOULDER SURGERY     Rotator cuff, left   ULNAR TUNNEL RELEASE Left 09/10/2014   Procedure: LEFT CUBITAL TUNNEL RELEASE;  Surgeon: Marianna Payment, MD;  Location: [facility];  Service: Orthopedics;  Laterality: Left;    Family History  Problem Relation Age of Onset   Cancer Mother        breast cancer   Other Mother    Hyperlipidemia Mother    Stroke Father        Intracranial hemorrhage   Other Brother    Other Brother    Other Brother    Colon cancer Neg Hx    Colon polyps Neg Hx    Esophageal cancer Neg Hx    Stomach cancer Neg Hx    Rectal cancer Neg Hx     Social  History   Socioeconomic History   Marital status: Married    Spouse name: Daisy   Number of children: 1   Years of education: 14   Highest education level: Not on file  Occupational History   Occupation: Unemployed    Comment: Disability  Tobacco Use   Smoking status: Never   Smokeless tobacco: Never  Vaping Use   Vaping Use: Never used  Substance and Sexual Activity   Alcohol use: Yes    Alcohol/week: 7.0 standard drinks    Types: 7 Cans of beer per week    Comment: Consumes alcohol on occasion   Drug use: No   Sexual activity: Yes    Birth control/protection: Other-see comments    Comment: vasectomy  Other Topics Concern   Not on file  Social History Narrative   Lives with wife Investment banker, corporate) and son   Disabled   Caffeine use: Drinks coffee sometimes in the am   Right handed   occ EtOH   Non-smoker (never)   No drugs   Social Determinants of Radio broadcast assistant Strain: Not on file  Food Insecurity: Not on file  Transportation Needs:  Not on file  Physical Activity: Not on file  Stress: Not on file  Social Connections: Not on file  Intimate Partner Violence: Not on file    Outpatient Medications Prior to Visit  Medication Sig Dispense Refill   acyclovir (ZOVIRAX) 800 MG tablet TAKE 1 TABLET (800 MG TOTAL) BY MOUTH DAILY. 90 tablet 1   clonazePAM (KLONOPIN) 1 MG tablet Take 1 tablet (1 mg total) by mouth at bedtime. 90 tablet 0   ezetimibe (ZETIA) 10 MG tablet Take 1 tablet (10 mg total) by mouth daily. 90 tablet 0   gabapentin (NEURONTIN) 300 MG capsule Take 1 capsule (300 mg total) by mouth in the morning AND 2 capsules (600 mg total) every evening. 270 capsule 1   oxybutynin (DITROPAN) 5 MG tablet Take 0.5 tablets (2.5 mg total) by mouth 3 (three) times daily. 46 tablet 0   rOPINIRole (REQUIP) 3 MG tablet Take 1 tablet (3 mg total) by mouth at bedtime. 90 tablet 1   Rotigotine (NEUPRO) 3 MG/24HR PT24 Place 1 patch onto the skin daily 30 patch 5   DULoxetine  (CYMBALTA) 30 MG capsule Take 1 capsule (30 mg total) by mouth daily for 14 days, THEN 1 capsule (30 mg total) 2 (two) times daily. 60 capsule 3   No facility-administered medications prior to visit.    Allergies  Allergen Reactions   Prozac [Fluoxetine Hcl]     tolerate well due to SE -felt sluggish, tired, and anxious    Pregabalin Other (See Comments)    Intolerance, somnolence   Statins Other (See Comments)    Other reaction(s): Muscle Pain Other reaction(s): Muscle Pain Other reaction(s): Muscle Pain     ROS Review of Systems Review of Systems:  A fourteen system review of systems was performed and found to be positive as per HPI.   Objective:    Physical Exam General:  Well Developed, well nourished, appropriate for stated age.  Neuro:  Alert and oriented,  extra-ocular muscles intact  HEENT:  Normocephalic, atraumatic, neck supple Skin:  no gross rash, warm, pink. Cardiac:  RRR Respiratory:  CTA B/L, Not using accessory muscles, speaking in full sentences- unlabored. Vascular:  Ext warm, no cyanosis apprec.; cap RF less 2 sec. Psych:  No HI/SI, judgement and insight good, Euthymic mood. Full Affect.  BP 119/69   Pulse 78   Temp 98.1 F (36.7 C)   Ht '5\' 7"'  (1.702 m)   Wt 157 lb (71.2 kg)   SpO2 99%   BMI 24.59 kg/m  Wt Readings from Last 3 Encounters:  06/14/21 157 lb (71.2 kg)  03/08/21 157 lb 2 oz (71.3 kg)  02/21/21 158 lb (71.7 kg)     Health Maintenance Due  Topic Date Due   Pneumonia Vaccine 18+ Years old (1 - PCV) Never done   Zoster Vaccines- Shingrix (1 of 2) Never done   TETANUS/TDAP  08/06/2018   COVID-19 Vaccine (4 - Booster for Pfizer series) 07/29/2020   INFLUENZA VACCINE  03/06/2021    There are no preventive care reminders to display for this patient.  Lab Results  Component Value Date   TSH 7.570 (H) 12/06/2020   Lab Results  Component Value Date   WBC 6.5 12/06/2020   HGB 16.6 12/06/2020   HCT 48.8 12/06/2020   MCV 89  12/06/2020   PLT 244 12/06/2020   Lab Results  Component Value Date   NA 142 12/06/2020   K 4.3 12/06/2020   CO2 21 12/06/2020  GLUCOSE 88 12/06/2020   BUN 24 12/06/2020   CREATININE 1.10 12/06/2020   BILITOT 0.6 12/06/2020   ALKPHOS 105 12/06/2020   AST 21 12/06/2020   ALT 24 12/06/2020   PROT 6.8 12/06/2020   ALBUMIN 4.6 12/06/2020   CALCIUM 9.4 12/06/2020   EGFR 75 12/06/2020   Lab Results  Component Value Date   CHOL 173 10/10/2020   Lab Results  Component Value Date   HDL 50 10/10/2020   Lab Results  Component Value Date   LDLCALC 107 (H) 10/10/2020   Lab Results  Component Value Date   TRIG 84 10/10/2020   Lab Results  Component Value Date   CHOLHDL 3.5 10/10/2020   Lab Results  Component Value Date   HGBA1C 5.1 12/06/2020      Assessment & Plan:   Problem List Items Addressed This Visit   None Visit Diagnoses     Urinary frequency    -  Primary      Urinary frequency/urgency secondary to hereditary spastic paraparesis. Recommend to continue with oxybutynin 2.5 mg TID. If symptoms fail to improve or worsen recommend changing to Oxybutynin XL 5 mg. Patient verbalized understanding and will let me know. Will continue to monitor.   No orders of the defined types were placed in this encounter.   Follow-up: Return for CPE and FBW May 2023.    Lorrene Reid, PA-C

## 2021-06-14 NOTE — Patient Instructions (Signed)
Urinary Frequency, Adult ?Urinary frequency means urinating more often than usual. You may urinate every 1-2 hours even though you drink a normal amount of fluid and do not have a bladder infection or condition. Although you urinate more often than normal, the total amount of urine produced in a day is normal. ?With urinary frequency, you may have an urgent need to urinate often. The stress and anxiety of needing to find a bathroom quickly can make this urge worse. This condition may go away on its own, or you may need treatment at home. Home treatment may include bladder training, exercises, taking medicines, or making changes to your diet. ?Follow these instructions at home: ?Bladder health ?Your health care provider will tell you what to do to improve bladder health. You may be told to: ?Keep a bladder diary. Keep track of: ?What you eat and drink. ?How often you urinate. ?How much you urinate. ?Follow a bladder training program. This may include: ?Learning to delay going to the bathroom. ?Double urinating, also called voiding. This helps if you are not completely emptying your bladder. ?Scheduled voiding. ?Do Kegel exercises. Kegel exercises strengthen the muscles that help control urination, which may help the condition. ? ?Eating and drinking ?Follow instructions from your health care provider about eating or drinking restrictions. You may be told to: ?Avoid caffeine. ?Drink fewer fluids, especially alcohol. ?Avoid drinking in the evening. ?Avoid foods or drinks that may irritate the bladder. These include coffee, tea, soda, artificial sweeteners, citrus, tomato-based foods, and chocolate. ?Eat foods that help prevent or treat constipation. Constipation can make urinary frequency worse. You may need to take these actions to prevent or treat constipation: ?Drink enough fluid to keep your urine pale yellow. ?Take over-the-counter or prescription medicines. ?Eat foods that are high in fiber, such as beans, whole  grains, and fresh fruits and vegetables. ?Limit foods that are high in fat and processed sugars, such as fried or sweet foods. ?General instructions ?Take over-the-counter and prescription medicines only as told by your health care provider. ?Keep all follow-up visits. This is important. ?Contact a health care provider if: ?You start urinating more often. ?You feel pain or irritation when you urinate. ?You notice blood in your urine. ?Your urine looks cloudy. ?You develop a fever. ?You begin vomiting. ?Get help right away if: ?You are unable to urinate. ?Summary ?Urinary frequency means urinating more often than usual. With urinary frequency, you may urinate every 1-2 hours even though you drink a normal amount of fluid and do not have a bladder infection or other bladder condition. ?Your health care provider may recommend that you keep a bladder diary, follow a bladder training program, or make dietary changes. ?If told by your health care provider, do Kegel exercises to strengthen the muscles that help control urination. ?Take over-the-counter and prescription medicines only as told by your health care provider. ?Contact a health care provider if your symptoms do not improve or get worse. ?This information is not intended to replace advice given to you by your health care provider. Make sure you discuss any questions you have with your health care provider. ?Document Revised: 02/26/2020 Document Reviewed: 02/26/2020 ?Elsevier Patient Education ? 2022 [employer] ? ?

## 2021-07-07 ENCOUNTER — Other Ambulatory Visit (HOSPITAL_COMMUNITY): Payer: Self-pay

## 2021-07-08 ENCOUNTER — Telehealth: Payer: No Typology Code available for payment source | Admitting: Nurse Practitioner

## 2021-07-08 DIAGNOSIS — Z20828 Contact with and (suspected) exposure to other viral communicable diseases: Secondary | ICD-10-CM

## 2021-07-08 DIAGNOSIS — R051 Acute cough: Secondary | ICD-10-CM

## 2021-07-08 NOTE — Progress Notes (Signed)
E visit for Flu like symptoms   We are sorry that you are not feeling well.  Here is how we plan to help! Based on what you have shared with me it looks like you may have possible exposure to a virus that causes influenza.  Influenza or "the flu" is   an infection caused by a respiratory virus. The flu virus is highly contagious and persons who did not receive their yearly flu vaccination may "catch" the flu from close contact.  We have anti-viral medications to treat the viruses that cause this infection. They are not a "cure" and only shorten the course of the infection. These prescriptions are most effective when they are given within the first 2 days of "flu" symptoms. Antiviral medication are indicated if you have a high risk of complications from the flu. You should  also consider an antiviral medication if you are in close contact with someone who is at risk. These medications can help patients avoid complications from the flu  but have side effects that you should know. Possible side effects from Tamiflu or oseltamivir include nausea, vomiting, diarrhea, dizziness, headaches, eye redness, sleep problems or other respiratory symptoms. You should not take Tamiflu if you have an allergy to oseltamivir or any to the ingredients in Tamiflu.  Based upon your symptoms and potential risk factors I have prescribed Oseltamivir (Tamiflu).  It has been sent to your designated pharmacy.  You will take one 75 mg capsule orally twice a day for the next 5 days.  ANYONE WHO HAS FLU SYMPTOMS SHOULD: Stay home. The flu is highly contagious and going out or to work exposes others! Be sure to drink plenty of fluids. Water is fine as well as fruit juices, sodas and electrolyte beverages. You may want to stay away from caffeine or alcohol. If you are nauseated, try taking small sips of liquids. How do you know if you are getting enough fluid? Your urine should be a pale yellow or almost colorless. Get rest. Taking  a steamy shower or using a humidifier may help nasal congestion and ease sore throat pain. Using a saline nasal spray works much the same way. Cough drops, hard candies and sore throat lozenges may ease your cough. Line up a caregiver. Have someone check on you regularly.   GET HELP RIGHT AWAY IF: You cannot keep down liquids or your medications. You become short of breath Your fell like you are going to pass out or loose consciousness. Your symptoms persist after you have completed your treatment plan MAKE SURE YOU  Understand these instructions. Will watch your condition. Will get help right away if you are not doing well or get worse.  Your e-visit answers were reviewed by a board certified advanced clinical practitioner to complete your personal care plan.  Depending on the condition, your plan could have included both over the counter or prescription medications.  If there is a problem please reply  once you have received a response from your provider.  Your safety is important to us.  If you have drug allergies check your prescription carefully.    You can use MyChart to ask questions about today's visit, request a non-urgent call back, or ask for a work or school excuse for 24 hours related to this e-Visit. If it has been greater than 24 hours you will need to follow up with your provider, or enter a new e-Visit to address those concerns.  You will get an e-mail in the   next two days asking about your experience.  I hope that your e-visit has been valuable and will speed your recovery. Thank you for using e-visits.  5-10 minutes spent reviewing and documenting in chart.

## 2021-07-10 ENCOUNTER — Other Ambulatory Visit (HOSPITAL_COMMUNITY): Payer: Self-pay

## 2021-07-10 ENCOUNTER — Telehealth: Payer: Self-pay | Admitting: Physician Assistant

## 2021-07-10 DIAGNOSIS — R350 Frequency of micturition: Secondary | ICD-10-CM

## 2021-07-10 MED ORDER — OXYBUTYNIN CHLORIDE ER 5 MG PO TB24
5.0000 mg | ORAL_TABLET | Freq: Every day | ORAL | 0 refills | Status: DC
  Filled 2021-07-10: qty 90, 90d supply, fill #0

## 2021-07-10 NOTE — Addendum Note (Signed)
Addended by: Mayer Masker on: 07/10/2021 12:05 PM   Modules accepted: Orders

## 2021-07-10 NOTE — Telephone Encounter (Signed)
Left msg for patient to advise below. AS, CMA

## 2021-07-10 NOTE — Telephone Encounter (Signed)
Patient is requesting XR of Oxybutynin. He says you discussed changing to XR in the past. AS, CMA

## 2021-07-13 ENCOUNTER — Other Ambulatory Visit: Payer: Self-pay | Admitting: Internal Medicine

## 2021-07-13 ENCOUNTER — Other Ambulatory Visit: Payer: Self-pay

## 2021-07-13 ENCOUNTER — Other Ambulatory Visit (HOSPITAL_COMMUNITY): Payer: Self-pay

## 2021-07-13 MED ORDER — EZETIMIBE 10 MG PO TABS
10.0000 mg | ORAL_TABLET | Freq: Every day | ORAL | 0 refills | Status: DC
  Filled 2021-07-13: qty 90, 90d supply, fill #0

## 2021-07-18 ENCOUNTER — Telehealth: Payer: Self-pay | Admitting: Neurology

## 2021-07-18 ENCOUNTER — Other Ambulatory Visit (HOSPITAL_COMMUNITY): Payer: Self-pay

## 2021-07-18 MED ORDER — ROPINIROLE HCL 3 MG PO TABS
3.0000 mg | ORAL_TABLET | Freq: Every day | ORAL | 1 refills | Status: DC
  Filled 2021-07-18: qty 90, 90d supply, fill #0

## 2021-07-18 NOTE — Telephone Encounter (Signed)
Requip refill sent to [facility]. Dr. Vickey Huger is WID in Dr. Anne Hahn' absence.

## 2021-07-18 NOTE — Addendum Note (Signed)
Addended by: Geronimo Running A on: 07/18/2021 11:15 AM   Modules accepted: Orders

## 2021-07-18 NOTE — Telephone Encounter (Signed)
Pt is requesting a refill for rOPINIRole (REQUIP) 3 MG tablet.  Pharmacy: [facility]

## 2021-07-24 IMAGING — MR MR LUMBAR SPINE W/O CM
5 series · 44 of 48 positions shown · non-contrast
Comparison: None.

CLINICAL DATA: Left-sided low back pain.  Bilateral leg weakness.

EXAM:
MRI LUMBAR SPINE WITHOUT CONTRAST
TECHNIQUE: Multiplanar, multisequence MR imaging of the lumbar spine was
performed. No intravenous contrast was administered.

[Series 3: T2 post-contrast · sagittal · 4.0mm · 0.88mm/px · 6 of 14 slices shown]
[im 1/14]
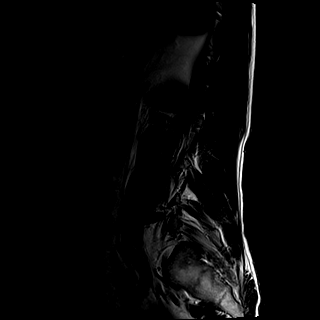
[im 3/14]
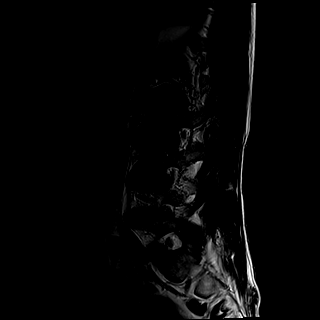
[im 6/14]
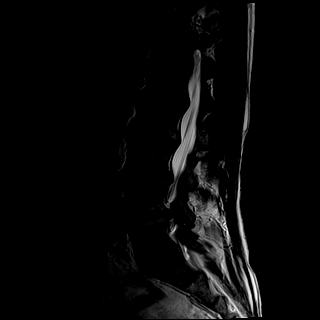
[im 8/14]
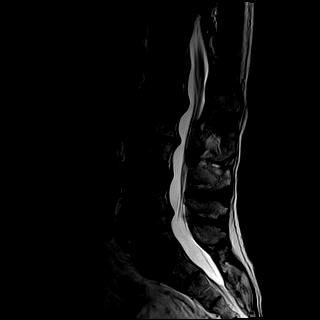
[im 11/14]
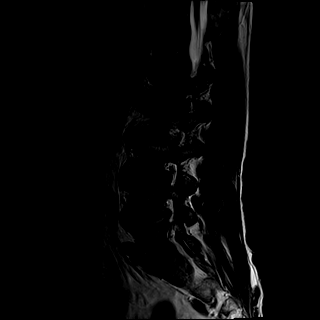
[im 14/14]
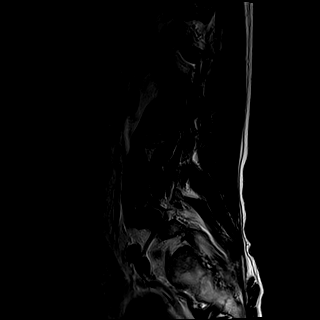

[Series 4: T1 · sagittal · 4.0mm · 0.88mm/px · 6 of 14 slices shown (1 of 2)]
[im 1/14]
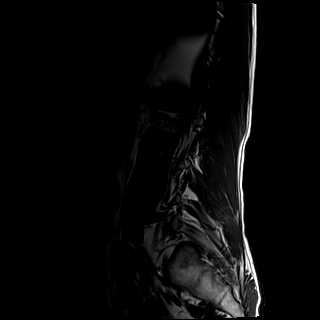
[im 3/14]
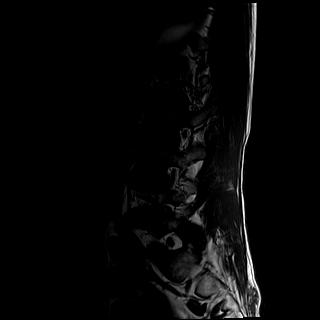
[im 6/14]
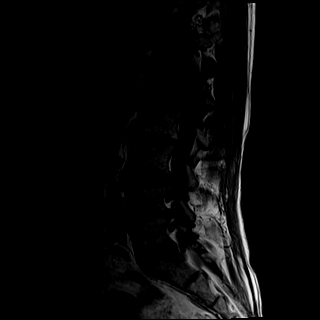
[im 8/14]
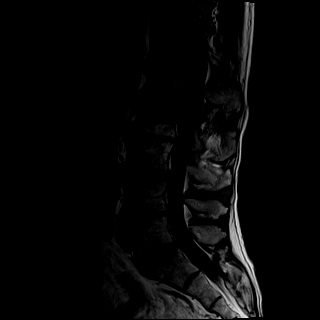
[im 11/14]
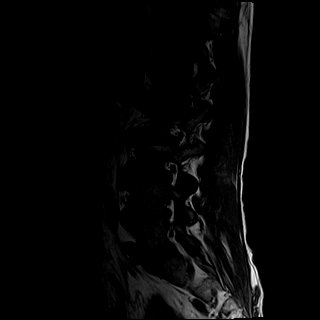
[im 14/14]
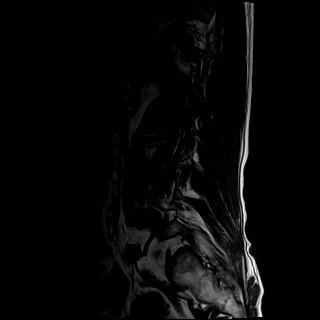

[Series 5: tirm sag · sagittal · 4.0mm · 0.55mm/px · 6 of 14 slices shown]
[im 1/14]
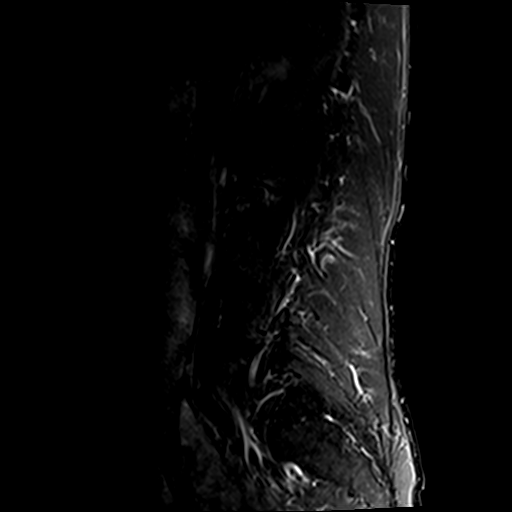
[im 3/14]
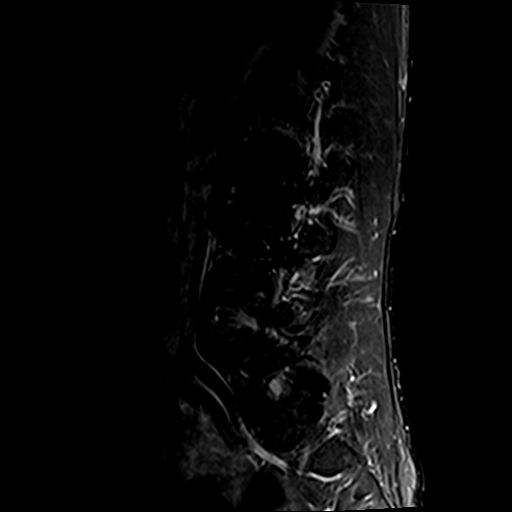
[im 6/14]
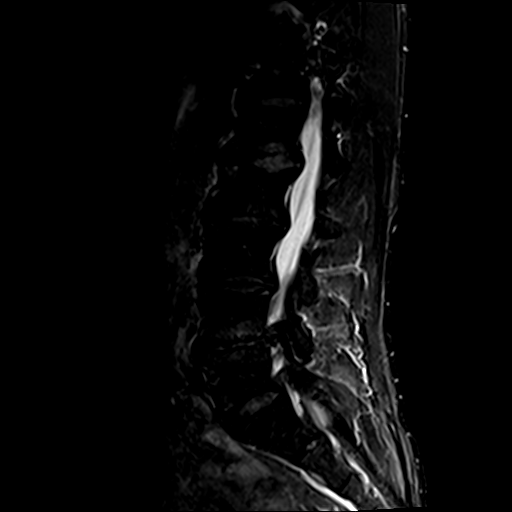
[im 8/14]
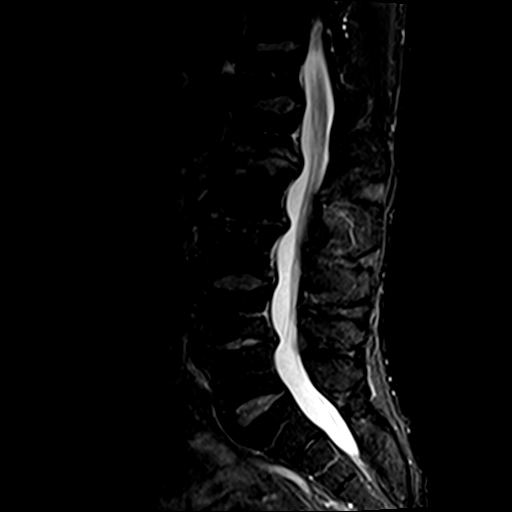
[im 11/14]
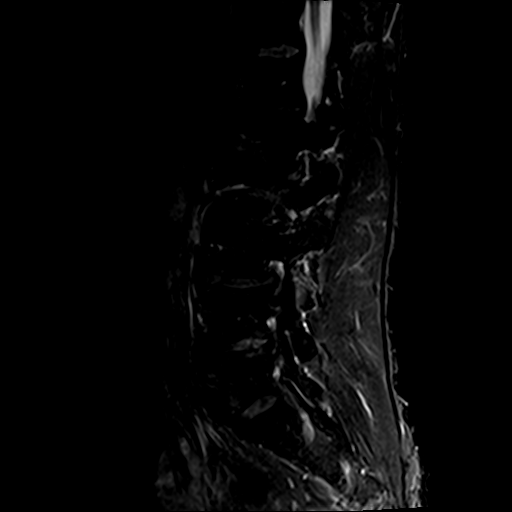
[im 14/14]
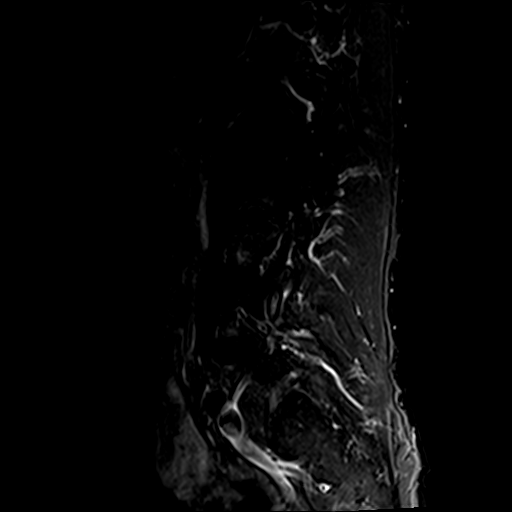

[Series 6: T1 · axial · 4.0mm · 0.78mm/px · z∈[-96,+108]mm · 11 of 35 slices shown (2 of 2)]
[im 1/35]
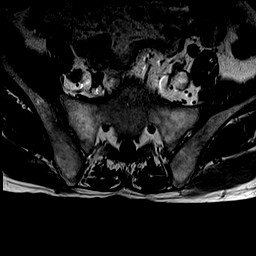
[im 3/35]
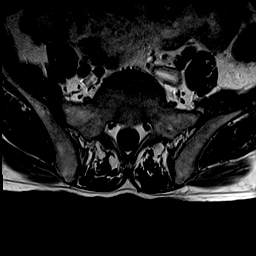
[im 5/35]
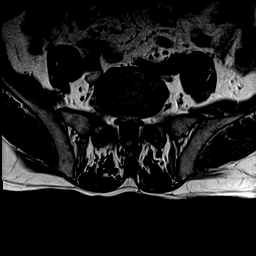
[im 8/35]
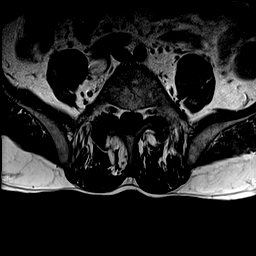
[im 10/35]
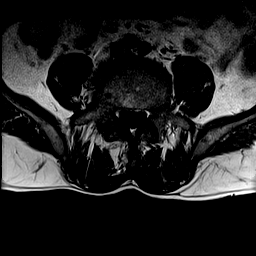
[im 15/35]
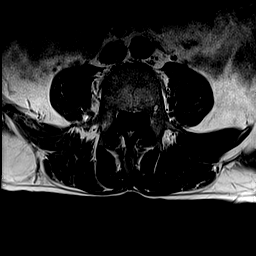
[im 18/35]
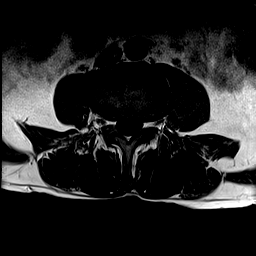
[im 20/35]
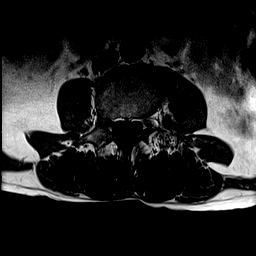
[im 25/35]
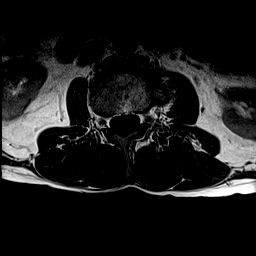
[im 30/35]
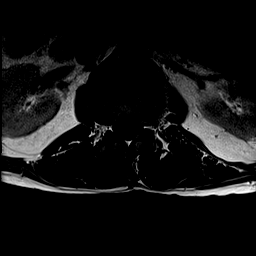
[im 35/35]
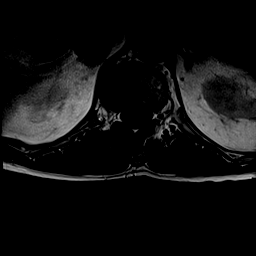

[Series 7: T2 · axial · 4.0mm · 0.78mm/px · z∈[-96,+108]mm · 15 of 35 slices shown]
[im 1/35]
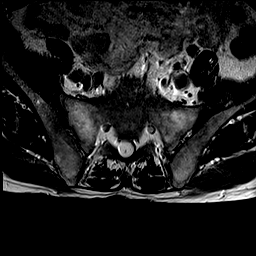
[im 3/35]
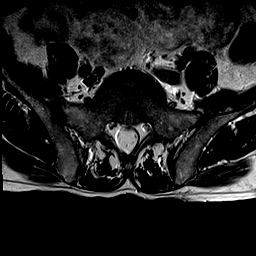
[im 5/35]
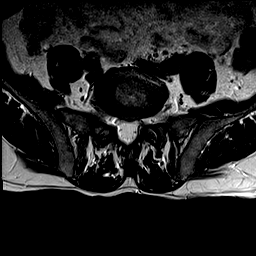
[im 8/35]
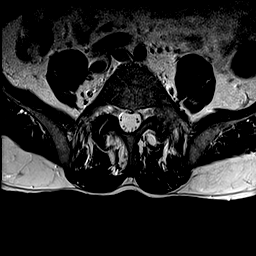
[im 10/35]
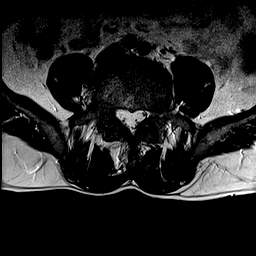
[im 13/35]
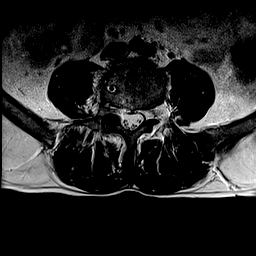
[im 15/35]
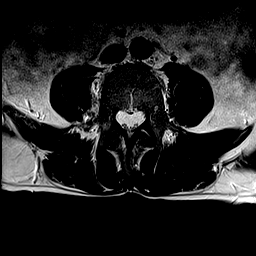
[im 18/35]
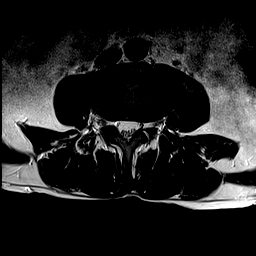
[im 20/35]
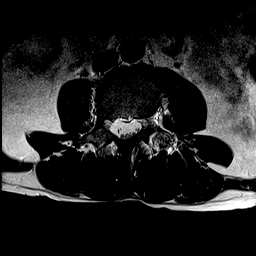
[im 22/35]
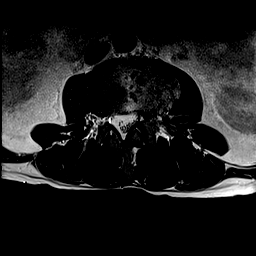
[im 25/35]
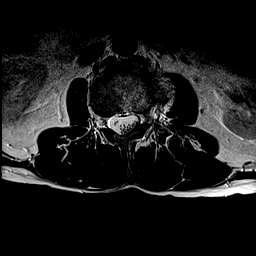
[im 27/35]
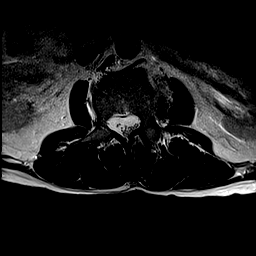
[im 30/35]
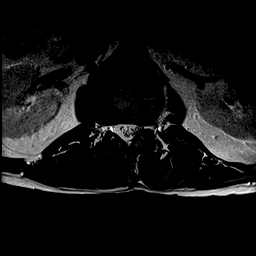
[im 32/35]
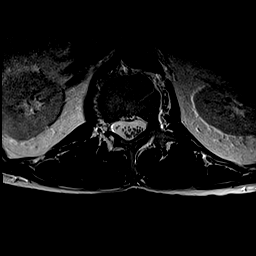
[im 35/35]
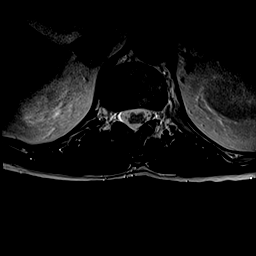

[44 of 48 positions shown; findings below may reference images not displayed]

FINDINGS: Segmentation:  Normal

Alignment:  Mild scoliosis.  Normal sagittal alignment.

Vertebrae:  Negative for fracture or mass.

Conus medullaris and cauda equina: Conus extends to the L1-2 level.
Conus and cauda equina appear normal.

Paraspinal and other soft tissues: Negative for paraspinous mass or
adenopathy.

Disc levels:

L1-2: Left foraminal disc protrusion and associated spurring
extending lateral to the foramen. This could affect the left L1 and
L2 nerve roots. Spinal canal adequate in size

L2-3: Asymmetric disc degeneration on the left with disc space
narrowing and spurring. Mild subarticular and foraminal stenosis on
the left due to spurring.

L3-4: Disc bulging and endplate spurring. Mild facet degeneration.
Negative for stenosis

L4-5: Asymmetric disc degeneration on the right with disc space
narrowing and spurring. Moderate subarticular and foraminal stenosis
on the right. Spinal canal adequate in size

L5-S1: Normal disc space. Bilateral facet hypertrophy without
significant stenosis.
IMPRESSION: Left foraminal and extraforaminal disc protrusion L1-2 with
associated spurring. This could affect left L2 and L1 nerve roots.

Mild subarticular and foraminal stenosis on the left at L2-3 due to
spurring

Moderate subarticular foraminal stenosis on the right at L4-5 due to
spurring.

## 2021-09-05 ENCOUNTER — Other Ambulatory Visit (HOSPITAL_COMMUNITY): Payer: Self-pay

## 2021-09-05 MED ORDER — MYRBETRIQ 25 MG PO TB24
25.0000 mg | ORAL_TABLET | Freq: Every day | ORAL | 3 refills | Status: DC
  Filled 2021-09-05: qty 30, 30d supply, fill #0
  Filled 2021-10-09: qty 30, 30d supply, fill #1

## 2021-09-12 ENCOUNTER — Other Ambulatory Visit (HOSPITAL_COMMUNITY): Payer: Self-pay

## 2021-09-13 ENCOUNTER — Ambulatory Visit: Payer: No Typology Code available for payment source | Admitting: Neurology

## 2021-09-26 ENCOUNTER — Other Ambulatory Visit (HOSPITAL_COMMUNITY): Payer: Self-pay

## 2021-10-02 ENCOUNTER — Telehealth: Payer: Self-pay | Admitting: Physician Assistant

## 2021-10-02 NOTE — Progress Notes (Signed)
PATIENT: Terry Howard DOB: [birth year]  REASON FOR VISIT: Follow up for hereditary spastic paraparesis, restless leg syndrome HISTORY FROM: Patient PRIMARY NEUROLOGIST: Dr. Marjory Lies   HISTORY OF PRESENT ILLNESS: Today 10/03/21 Terry Howard here today for follow-up.  Terry Howard is on Neupro patch, Requip, gabapentin, clonazepam for his symptoms. Claims Neupro patches caused rashes despite the location, was retaing fluid. RLS can bother during the day, but mostly at bedtime, never knows when its going to hit. Has insomnia, goes to bed at 11 PM, gets up around 1 or 2 PM nightly to sleep in family room. Is on disability, has small lawn mowing business. Is tripping more, uses quad cane. Terry Howard tries to use weight machine 3 times a week. Terry Howard has to be able to load his yard equipment. In the past Terry Howard was on baclofen but stopped it himself back in 2021 because Terry Howard felt the gabapentin worked better. Terry Howard has a scooter and walker Terry Howard can use. Tried AFO braces but made him trip. Did PT at neuro rehab, claims wasn't helpful. Here today alone.   HISTORY  03/08/2021 Dr. Anne Hahn: Terry Howard is a 66 year old right-handed white male with a history of hereditary spastic paraparesis.  The patient has had a slow gradual change in his ability to ambulate but Terry Howard is staying quite active.  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  The patient is currently off of baclofen, this did not help him.  Botox injections in the legs were not beneficial.  Terry Howard claims that Terry Howard had an evaluation for a baclofen pump in the past but the injections did not benefit him and Terry Howard did not pursue surgery.  Terry Howard was given gabapentin but stopped the medication as it was not helpful.  Terry Howard finds good benefit with his restless legs with the Neupro patch, Terry Howard is only on a 1 mg patch currently.  Terry Howard takes 3 mg of Requip at night as well.  The patient takes clonazepam at night.  If Terry Howard is particularly active during the day, his restless leg symptoms may be worse.  The patient falls on  occasion.  Terry Howard returns to this office for an evaluation.  REVIEW OF SYSTEMS: Out of a complete 14 system review of symptoms, the patient complains only of the following symptoms, and all other reviewed systems are negative.  See HPI  ALLERGIES: Allergies  Allergen Reactions   Prozac [Fluoxetine Hcl]     tolerate well due to SE -felt sluggish, tired, and anxious    Pregabalin Other (See Comments)    Intolerance, somnolence   Statins Other (See Comments)    Other reaction(s): Muscle Pain Other reaction(s): Muscle Pain Other reaction(s): Muscle Pain    Neupro [Rotigotine] Rash    HOME MEDICATIONS: Outpatient Medications Prior to Visit  Medication Sig Dispense Refill   acyclovir (ZOVIRAX) 800 MG tablet TAKE 1 TABLET (800 MG TOTAL) BY MOUTH DAILY. 90 tablet 1   clonazePAM (KLONOPIN) 1 MG tablet Take 1 tablet (1 mg total) by mouth at bedtime. 90 tablet 0   ezetimibe (ZETIA) 10 MG tablet Take 1 tablet (10 mg total) by mouth daily. 90 tablet 0   gabapentin (NEURONTIN) 300 MG capsule Take 1 capsule (300 mg total) by mouth in the morning AND 2 capsules (600 mg total) every evening. 270 capsule 1   mirabegron ER (MYRBETRIQ) 25 MG TB24 tablet Take 1 tablet (25 mg total) by mouth daily. 90 tablet 3   rOPINIRole (REQUIP) 3 MG tablet Take 1 tablet (3 mg total)  by mouth at bedtime. 90 tablet 1   oxybutynin (DITROPAN-XL) 5 MG 24 hr tablet Take 1 tablet (5 mg total) by mouth at bedtime. (Patient not taking: Reported on 10/03/2021) 90 tablet 0   Rotigotine (NEUPRO) 3 MG/24HR PT24 Place 1 patch onto the skin daily 30 patch 5   No facility-administered medications prior to visit.    PAST MEDICAL HISTORY: 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HCC)    Hyperlipidemia    Internal hemorrhoids    Mild carpal tunnel syndrome of right  wrist    Neurogenic bladder    RLS (restless legs syndrome)     PAST SURGICAL HISTORY: Past Surgical History:  Procedure Laterality Date   APPENDECTOMY     BREAST SURGERY Bilateral    cysts   COLONOSCOPY     HEMORRHOID BANDING     KNEE ARTHROSCOPY     right   SHOULDER SURGERY     Rotator cuff, left   ULNAR TUNNEL RELEASE Left 09/10/2014   Procedure: LEFT CUBITAL TUNNEL RELEASE;  Surgeon: Cheral AlmasNaiping Michael Xu, MD;  Location: [facility];  Service: Orthopedics;  Laterality: Left;    FAMILY HISTORY: Family History  Problem Relation Age of Onset   Cancer Mother        breast cancer   Other Mother    Hyperlipidemia Mother    Stroke Father        Intracranial hemorrhage   Other Brother    Other Brother    Other Brother    Colon cancer Neg Hx    Colon polyps Neg Hx    Esophageal cancer Neg Hx    Stomach cancer Neg Hx    Rectal cancer Neg Hx     SOCIAL HISTORY: Social History   Socioeconomic History   Marital status: Married    Spouse name: Daisy   Number of children: 1   Years of education: 14   Highest education level: Not on file  Occupational History   Occupation: Unemployed    Comment: Disability  Tobacco Use   Smoking status: Never   Smokeless tobacco: Never  Vaping Use   Vaping Use: Never used  Substance and Sexual Activity   Alcohol use: Yes    Alcohol/week: 7.0 standard drinks    Types: 7 Cans of beer per week    Comment: Consumes alcohol on occasion   Drug use: No   Sexual activity: Yes    Birth control/protection: Other-see comments    Comment: vasectomy  Other Topics Concern   Not on file  Social History Narrative   Lives with wife Banker(RN) and son   Disabled   Caffeine use: Drinks coffee sometimes in the am   Right handed   occ EtOH   Non-smoker (never)   No drugs   Social Determinants of Corporate investment bankerHealth   Financial Resource Strain: Not on file  Food Insecurity: Not on file  Transportation Needs: Not on file  Physical Activity: Not  on file  Stress: Not on file  Social Connections: Not on file  Intimate Partner Violence: Not on file   PHYSICAL EXAM  Vitals:   10/03/21 0748  BP: 123/80  Pulse: 83  Weight: 165 lb 8 oz (75.1 kg)  Height: 5\' 7"  (1.702 m)  Body mass index is 25.92 kg/m.  Generalized: Well developed, in no acute distress, talks with eyes closed  Neurological examination  Mentation: Alert oriented to time, place, history taking. Follows all commands speech and language fluent Cranial nerve II-XII: Pupils were equal round reactive to light. Extraocular movements were full, visual field were full on confrontational test. Facial sensation and strength were normal.  Head turning and shoulder shrug were normal and symmetric. Motor: significant spasticity to lower extremities, 3/5 lower extremities, upper extremities appear normal Sensory: Sensory testing is intact to soft touch on all 4 extremities. No evidence of extinction is noted.  Coordination: Cerebellar testing reveals good finger-nose-finger, but cannot perform heel to shin Gait and station: spastic type gait, slow, unsteady, uses quad cane Reflexes: Deep tendon reflexes are symmetric but increased   DIAGNOSTIC DATA (LABS, IMAGING, TESTING) - I reviewed patient records, labs, notes, testing and imaging myself where available.  Lab Results  Component Value Date   WBC 6.5 12/06/2020   HGB 16.6 12/06/2020   HCT 48.8 12/06/2020   MCV 89 12/06/2020   PLT 244 12/06/2020      Component Value Date/Time   NA 142 12/06/2020 1351   K 4.3 12/06/2020 1351   CL 103 12/06/2020 1351   CO2 21 12/06/2020 1351   GLUCOSE 88 12/06/2020 1351   GLUCOSE 85 05/16/2016 1027   BUN 24 12/06/2020 1351   CREATININE 1.10 12/06/2020 1351   CREATININE 1.13 05/16/2016 1027   CALCIUM 9.4 12/06/2020 1351   PROT 6.8 12/06/2020 1351   ALBUMIN 4.6 12/06/2020 1351   AST 21 12/06/2020 1351   ALT 24 12/06/2020 1351   ALKPHOS 105 12/06/2020 1351   BILITOT 0.6  12/06/2020 1351   GFRNONAA 90 01/19/2019 1012   GFRNONAA 71 05/16/2016 1027   GFRAA 104 01/19/2019 1012   GFRAA 82 05/16/2016 1027   Lab Results  Component Value Date   CHOL 173 10/10/2020   HDL 50 10/10/2020   LDLCALC 107 (H) 10/10/2020   TRIG 84 10/10/2020   CHOLHDL 3.5 10/10/2020   Lab Results  Component Value Date   HGBA1C 5.1 12/06/2020   Lab Results  Component Value Date   VITAMINB12 1,130 06/11/2017   Lab Results  Component Value Date   TSH 7.570 (H) 12/06/2020   ASSESSMENT AND PLAN 66 y.o. year old male   1.  Hereditary spastic paraparesis 2.  Gait disorder 3.  Restless leg syndrome  -Switch to Horizant 600 mg at 5 PM, can continue the gabapentin 300 mg AM if needed  -Continue Requip, Klonopin as prescribed (claims doesn't need refills) -Claims Neupro caused rash and fluid retention  -Stopped baclofen in the past, felt like gabapentin was better, I think it might be reasonable to try again in the future, there is quite significant spasticity on exam to lower extremities -Reportedly considered for baclofen pump in the past, but decided against it, Botox injections were not helpful -Return back in 6 months or sooner if needed, will be followed by Dr. Marjory Lies since Dr .Anne Hahn retired  Meds ordered this encounter  Medications   Gabapentin Enacarbil (HORIZANT) 600 MG TBCR    Sig: Take 1 tablet (600 mg total) by mouth daily at 5 pm    Dispense:  30 tablet    Refill:  5   gabapentin (NEURONTIN) 300 MG capsule    Sig: Take 1 capsule (300 mg total) by mouth in the morning as needed    Dispense:  30 capsule  Refill:  5   rOPINIRole (REQUIP) 3 MG tablet    Sig: Take 1 tablet (3 mg total) by mouth at bedtime.    Dispense:  90 tablet    Refill:  1    Margie Ege, [place], DNP 10/03/2021, 7:54 AM [facility] [address], [address] [place], [place] [address] [phone]

## 2021-10-02 NOTE — Telephone Encounter (Signed)
Patient called and stated he feels like Neupro is causing him to break out with blisters and he is having some swelling and wanted you to be aware. I explained to him that the neurologist prescribed this medication to him and he needs to contact them however he states the Doctor is no longer practicing. Please advise. [phone]

## 2021-10-02 NOTE — Telephone Encounter (Signed)
Reached out to patient and advised him to call [facility] to discuss medication side effects. He has upcoming appointment with Margie Ege in August so suggested that be his point of contact. Patient verbalized understanding. AS, CMA

## 2021-10-03 ENCOUNTER — Encounter: Payer: Self-pay | Admitting: Neurology

## 2021-10-03 ENCOUNTER — Other Ambulatory Visit (HOSPITAL_COMMUNITY): Payer: Self-pay

## 2021-10-03 ENCOUNTER — Ambulatory Visit: Payer: No Typology Code available for payment source | Admitting: Neurology

## 2021-10-03 ENCOUNTER — Telehealth: Payer: Self-pay | Admitting: *Deleted

## 2021-10-03 VITALS — BP 123/80 | HR 83 | Ht 67.0 in | Wt 165.5 lb

## 2021-10-03 DIAGNOSIS — G2581 Restless legs syndrome: Secondary | ICD-10-CM | POA: Diagnosis not present

## 2021-10-03 DIAGNOSIS — G114 Hereditary spastic paraplegia: Secondary | ICD-10-CM | POA: Diagnosis not present

## 2021-10-03 DIAGNOSIS — R269 Unspecified abnormalities of gait and mobility: Secondary | ICD-10-CM | POA: Diagnosis not present

## 2021-10-03 MED ORDER — BACLOFEN 10 MG PO TABS
10.0000 mg | ORAL_TABLET | Freq: Every day | ORAL | 5 refills | Status: DC
  Filled 2021-10-03: qty 30, 30d supply, fill #0
  Filled 2021-10-30: qty 30, 30d supply, fill #1
  Filled 2021-11-19: qty 30, 30d supply, fill #2
  Filled 2021-12-31: qty 30, 30d supply, fill #3
  Filled 2022-01-17 – 2022-01-28 (×2): qty 30, 30d supply, fill #4
  Filled 2022-02-26: qty 30, 30d supply, fill #5

## 2021-10-03 MED ORDER — HORIZANT 600 MG PO TBCR
600.0000 mg | EXTENDED_RELEASE_TABLET | Freq: Every day | ORAL | 5 refills | Status: DC
Start: 2021-10-03 — End: 2021-10-03
  Filled 2021-10-03 (×2): qty 30, 30d supply, fill #0

## 2021-10-03 MED ORDER — GABAPENTIN 300 MG PO CAPS
ORAL_CAPSULE | ORAL | 5 refills | Status: DC
  Filled 2021-10-03: qty 90, 30d supply, fill #0
  Filled 2021-12-31: qty 90, 30d supply, fill #1

## 2021-10-03 MED ORDER — ROPINIROLE HCL 3 MG PO TABS
3.0000 mg | ORAL_TABLET | Freq: Every day | ORAL | 1 refills | Status: DC
Start: 2021-10-03 — End: 2022-04-04
  Filled 2021-10-03: qty 90, 90d supply, fill #0
  Filled 2022-01-14: qty 90, 90d supply, fill #1

## 2021-10-03 MED ORDER — GABAPENTIN 300 MG PO CAPS
300.0000 mg | ORAL_CAPSULE | Freq: Every morning | ORAL | 5 refills | Status: DC
  Filled 2021-10-03: qty 30, 30d supply, fill #0

## 2021-10-03 NOTE — Telephone Encounter (Signed)
Can you call the patient and let him know that the Horizant is not covered by insurance. We can go back to gabapentin 300 mg AM, 600 mg PM. Can add in Baclofen. We could start adding in 10 mg at bedtime and increase the dose from there. In the past I don't believe he has been on gabapentin and baclofen together.

## 2021-10-03 NOTE — Telephone Encounter (Signed)
Attempted PA for Horizant with his insurance plan Data processing manager). Received the following message:  This drug/product is not covered under the pharmacy benefit. Prior Authorization is not available.

## 2021-10-03 NOTE — Patient Instructions (Addendum)
Add on Horizant 600 mg at 5 PM  Can take gabapentin 300 mg in morning if needed for symptoms, you may not need this however Return back in 6 months  Meds ordered this encounter  Medications   Gabapentin Enacarbil (HORIZANT) 600 MG TBCR    Sig: Take 1 tablet at 5 PM    Dispense:  30 tablet    Refill:  5   gabapentin (NEURONTIN) 300 MG capsule    Sig: Take 1 tablet in the morning as needed    Dispense:  30 capsule    Refill:  5   rOPINIRole (REQUIP) 3 MG tablet    Sig: Take 1 tablet (3 mg total) by mouth at bedtime.    Dispense:  90 tablet    Refill:  1

## 2021-10-03 NOTE — Telephone Encounter (Signed)
I called patient to discuss. No answer, left a message asking him to call us back.  

## 2021-10-03 NOTE — Addendum Note (Signed)
Addended by: Geronimo Running A on: 10/03/2021 03:36 PM   Modules accepted: Orders

## 2021-10-03 NOTE — Telephone Encounter (Signed)
Patient returned my call. He is agreeable to going back to gabapentin 300mg  in AM and 600mg  in PM and adding baclofen QHS.  He will let know how this works for him.  Received VOs from , NP. RXs sent to [facility].

## 2021-10-09 ENCOUNTER — Other Ambulatory Visit: Payer: Self-pay | Admitting: Internal Medicine

## 2021-10-09 ENCOUNTER — Other Ambulatory Visit (HOSPITAL_COMMUNITY): Payer: Self-pay

## 2021-10-09 MED ORDER — EZETIMIBE 10 MG PO TABS
10.0000 mg | ORAL_TABLET | Freq: Every day | ORAL | 0 refills | Status: DC
  Filled 2021-10-09: qty 30, 30d supply, fill #0

## 2021-10-30 ENCOUNTER — Other Ambulatory Visit (HOSPITAL_COMMUNITY): Payer: Self-pay

## 2021-11-06 ENCOUNTER — Other Ambulatory Visit (HOSPITAL_COMMUNITY): Payer: Self-pay

## 2021-11-06 ENCOUNTER — Other Ambulatory Visit: Payer: Self-pay | Admitting: Physician Assistant

## 2021-11-06 DIAGNOSIS — B009 Herpesviral infection, unspecified: Secondary | ICD-10-CM

## 2021-11-06 MED ORDER — ACYCLOVIR 800 MG PO TABS
800.0000 mg | ORAL_TABLET | Freq: Every day | ORAL | 1 refills | Status: DC
  Filled 2021-11-06: qty 90, 90d supply, fill #0
  Filled 2022-04-03: qty 90, 90d supply, fill #1

## 2021-11-19 ENCOUNTER — Other Ambulatory Visit: Payer: Self-pay | Admitting: Internal Medicine

## 2021-11-20 ENCOUNTER — Other Ambulatory Visit (HOSPITAL_COMMUNITY): Payer: Self-pay

## 2021-11-21 ENCOUNTER — Telehealth: Payer: Self-pay | Admitting: Internal Medicine

## 2021-11-21 NOTE — Telephone Encounter (Signed)
?*  STAT* If patient is at the pharmacy, call can be transferred to refill team. ? ? ?1. Which medications need to be refilled? (please list name of each medication and dose if known)  ? ezetimibe (ZETIA) 10 MG tablet  ? ? ?2. Which pharmacy/location (including street and city if local pharmacy) is medication to be sent to?  ?[facility] ?3. Do they need a 30 day or 90 day supply? 90 day ? ?Pt has appt on 5/12 with Dr. Rennis Golden ? ?

## 2021-11-22 ENCOUNTER — Encounter: Payer: Self-pay | Admitting: Internal Medicine

## 2021-11-22 ENCOUNTER — Other Ambulatory Visit: Payer: Self-pay | Admitting: Internal Medicine

## 2021-11-22 ENCOUNTER — Other Ambulatory Visit (HOSPITAL_COMMUNITY): Payer: Self-pay

## 2021-11-22 ENCOUNTER — Other Ambulatory Visit: Payer: No Typology Code available for payment source

## 2021-11-22 DIAGNOSIS — R7989 Other specified abnormal findings of blood chemistry: Secondary | ICD-10-CM

## 2021-11-22 DIAGNOSIS — E039 Hypothyroidism, unspecified: Secondary | ICD-10-CM

## 2021-11-22 DIAGNOSIS — E785 Hyperlipidemia, unspecified: Secondary | ICD-10-CM

## 2021-11-22 NOTE — Telephone Encounter (Signed)
Please see info below.

## 2021-11-23 ENCOUNTER — Other Ambulatory Visit: Payer: Self-pay

## 2021-11-23 ENCOUNTER — Other Ambulatory Visit (HOSPITAL_COMMUNITY): Payer: Self-pay

## 2021-11-23 LAB — LIPID PANEL
Chol/HDL Ratio: 4.5 ratio (ref 0.0–5.0)
Cholesterol, Total: 201 mg/dL — ABNORMAL HIGH (ref 100–199)
HDL: 45 mg/dL (ref >39)
LDL Chol Calc (NIH): 135 mg/dL — ABNORMAL HIGH (ref 0–99)
Triglycerides: 114 mg/dL (ref 0–149)
VLDL Cholesterol Cal: 21 mg/dL (ref 5–40)

## 2021-11-23 LAB — TSH: TSH: 5.84 u[IU]/mL — ABNORMAL HIGH (ref 0.450–4.500)

## 2021-11-23 LAB — T4, FREE: Free T4: 1.3 ng/dL (ref 0.82–1.77)

## 2021-11-23 LAB — T3: T3, Total: 139 ng/dL (ref 71–180)

## 2021-11-23 MED ORDER — EZETIMIBE 10 MG PO TABS
10.0000 mg | ORAL_TABLET | Freq: Every day | ORAL | 0 refills | Status: DC
  Filled 2021-11-23: qty 30, 30d supply, fill #0

## 2021-12-11 ENCOUNTER — Other Ambulatory Visit (HOSPITAL_COMMUNITY): Payer: Self-pay

## 2021-12-11 ENCOUNTER — Other Ambulatory Visit: Payer: Self-pay | Admitting: Neurology

## 2021-12-11 DIAGNOSIS — G4700 Insomnia, unspecified: Secondary | ICD-10-CM

## 2021-12-11 DIAGNOSIS — F339 Major depressive disorder, recurrent, unspecified: Secondary | ICD-10-CM

## 2021-12-12 ENCOUNTER — Encounter: Payer: Self-pay | Admitting: Neurology

## 2021-12-12 ENCOUNTER — Other Ambulatory Visit (HOSPITAL_COMMUNITY): Payer: Self-pay

## 2021-12-12 MED ORDER — CLONAZEPAM 1 MG PO TABS
1.0000 mg | ORAL_TABLET | Freq: Every day | ORAL | 0 refills | Status: DC
  Filled 2021-12-12: qty 90, 90d supply, fill #0

## 2021-12-12 NOTE — Telephone Encounter (Signed)
Patient is up to date on his appointments. Patient is due for a refill on klonopin. [place] Controlled Substance Registry checked and is appropriate. ° °

## 2021-12-15 ENCOUNTER — Encounter: Payer: Self-pay | Admitting: Internal Medicine

## 2021-12-15 ENCOUNTER — Telehealth: Payer: No Typology Code available for payment source | Admitting: Internal Medicine

## 2021-12-15 VITALS — Ht 67.0 in | Wt 162.0 lb

## 2021-12-15 DIAGNOSIS — G114 Hereditary spastic paraplegia: Secondary | ICD-10-CM

## 2021-12-15 DIAGNOSIS — E785 Hyperlipidemia, unspecified: Secondary | ICD-10-CM | POA: Diagnosis not present

## 2021-12-15 DIAGNOSIS — M791 Myalgia, unspecified site: Secondary | ICD-10-CM | POA: Diagnosis not present

## 2021-12-15 DIAGNOSIS — T466X5D Adverse effect of antihyperlipidemic and antiarteriosclerotic drugs, subsequent encounter: Secondary | ICD-10-CM | POA: Diagnosis not present

## 2021-12-15 NOTE — Progress Notes (Signed)
? ?Virtual Visit via Video Note  ? ?This visit type was conducted due to national recommendations for restrictions regarding the COVID-19 Pandemic (e.g. social distancing) in an effort to limit this patient's exposure and mitigate transmission in our community.  Due to his co-morbid illnesses, this patient is at least at moderate risk for complications without adequate follow up.  This format is felt to be most appropriate for this patient at this time.  The patient does have access to video technology.  All issues noted in this document were discussed and addressed.  A limited physical exam could be performed with this format.  Please refer to the patient's chart for his  consent to telehealth for [facility].  ? ?Date:  12/15/2021  ? ?ID:  Terry Howard, DOB 1956/07/01, MRN [id number] ?The patient was identified using 2 identifiers. ? ?Evaluation Performed:  Follow-Up Visit ? ?Patient Location:  ?[address] ?[place] [address] ? ?Provider location:   ?[address], [address] ?[place], [place] [address] ? ?PCP:  Terry MaskerAbonza, Maritza, PA-C  ?Cardiologist:  None ?Electrophysiologist:  None  ? ?Chief Complaint:  Follow-up lipids ? ?History of Present Illness:   ? ?Terry Howard is a 66 y.o. male who presents via audio/video conferencing for a telehealth visit today.  Terry Howard is a pleasant 66 year old male kindly referred to the lipid clinic for evaluation and management of dyslipidemia.  History is significant for unfortunate hereditary spastic paraparesis, dyslipidemia and other neurologic conditions, who presents for evaluation and management of dyslipidemia.  Terry Howard has some longstanding dyslipidemia but no known coronary disease.  There is a questionable family history of heart disease having noted that his mother died with cancer but had high cholesterol and his father having died of complications related to alcohol use.  Most recently Terry Howard had a cholesterol profile showing  total cholesterol 225, HDL 39, LDL 165 and triglycerides [id number].  He reports overall pretty healthy diet, however is not able to exercise with the lower extremities to do a lot of aerobic activity but does exercise regularly of his upper extremities.  He has no history of hypertension, diabetes, tobacco abuse or other cardiovascular risk factors.  Weight is actually appropriate and blood pressure is well controlled today.  Unfortunately, Terry Howard has been on a number of different statin medications in the past all of which caused worsening myalgias and spasticity of his lower extremities.  He reported he would not take any additional statins.  He also brought a list of possible statin alternatives with him to the office today to review. ? ?07/15/2020 ? ?Terry Howard is seen today via telephone follow-up.  He seems to have done well on ezetimibe without any significant side effects.  This has further lowered his cholesterol, with total cholesterol now 199, triglycerides 98, HDL 43 and LDL 138.  Ultimately I would recommend a target of less than 100.  We discussed other options and at this point he is hesitant to consider other treatments, particularly statins due to his spastic paraparesis. ? ?12/15/2021 ? ?Terry Howard continues to feel well. He has been compliant with the zetia and continues to exercise and eat healthy, however, his cholesterol has increased. In April, TC 201, TG 114, HDL 45 and LDL 135 (up from 107).  He was very surprised that the numbers were not improved. He did have repeat TSH, which is barely elevated, but t3 and free T4 are normal. He was noted to have low Vit D last year  and takes a daily multivitamin. ? ?The patient does not have symptoms concerning for COVID-19 infection (fever, chills, cough, or new SHORTNESS OF BREATH).  ? ? ?Prior CV studies:   ?The following studies were reviewed today: ? ?Chart reviewed ? ?PMHx:  ?Past Medical History:  ?Diagnosis Date  ? Arthritis   ? Chronic insomnia    ? Chronic low back pain   ? Annular tear at the L2-3 and L4-5 levels 4  ? Depression   ? Gait disorder   ? Hereditary spastic paraparesis (HCC)   ? Hyperlipidemia   ? Internal hemorrhoids   ? Mild carpal tunnel syndrome of right wrist   ? Neurogenic bladder   ? RLS (restless legs syndrome)   ? ? ?Past Surgical History:  ?Procedure Laterality Date  ? APPENDECTOMY    ? BREAST SURGERY Bilateral   ? cysts  ? COLONOSCOPY    ? HEMORRHOID BANDING    ? KNEE ARTHROSCOPY    ? right  ? SHOULDER SURGERY    ? Rotator cuff, left  ? ULNAR TUNNEL RELEASE Left 09/10/2014  ? Procedure: LEFT CUBITAL TUNNEL RELEASE;  Surgeon: [name], MD;  Location: [facility];  Service: Orthopedics;  Laterality: Left;  ? ? ?FAMHx:  ?Family History  ?Problem Relation Age of Onset  ? Cancer Mother   ?     breast cancer  ? Other Mother   ? Hyperlipidemia Mother   ? Stroke Father   ?     Intracranial hemorrhage  ? Other Brother   ? Other Brother   ? Other Brother   ? Colon cancer Neg Hx   ? Colon polyps Neg Hx   ? Esophageal cancer Neg Hx   ? Stomach cancer Neg Hx   ? Rectal cancer Neg Hx   ? ? ?SOCHx:  ? reports that he has never smoked. He has never used smokeless tobacco. He reports current alcohol use of about 7.0 standard drinks per week. He reports that he does not use drugs. ? ?ALLERGIES:  ?Allergies  ?Allergen Reactions  ? Prozac [Fluoxetine Hcl]   ?  tolerate well due to SE -felt sluggish, tired, and anxious   ? Pregabalin Other (See Comments)  ?  Intolerance, somnolence  ? Statins Other (See Comments)  ?  Other reaction(s): Muscle Pain ?Other reaction(s): Muscle Pain ?Other reaction(s): Muscle Pain ?  ? Neupro [Rotigotine] Rash  ? ? ?MEDS: ? ?Current Meds  ?Medication Sig  ? acyclovir (ZOVIRAX) 800 MG tablet Take 1 tablet (800 mg total) by mouth daily.  ? baclofen (LIORESAL) 10 MG tablet Take 1 tablet (10 mg total) by mouth at bedtime.  ? clonazePAM (KLONOPIN) 1 MG tablet Take 1 tablet (1 mg total) by mouth at bedtime.   ? ezetimibe (ZETIA) 10 MG tablet Take 1 tablet (10 mg total) by mouth daily.  ? gabapentin (NEURONTIN) 300 MG capsule Take 1 capsule (300 mg total) by mouth in the morning AND 2 capsules (600 mg total) every evening.  ? rOPINIRole (REQUIP) 3 MG tablet Take 1 tablet (3 mg total) by mouth at bedtime.  ?  ? ?ROS: ?Pertinent items noted in HPI and remainder of comprehensive ROS otherwise negative. ? ?Labs/Other Tests and Data Reviewed:   ? ?Recent Labs: ?11/22/2021: TSH 5.840  ? ?Recent Lipid Panel ?Lab Results  ?Component Value Date/Time  ? CHOL 201 (H) 11/22/2021 09:56 AM  ? TRIG 114 11/22/2021 09:56 AM  ? HDL 45 11/22/2021 09:56 AM  ?  CHOLHDL 4.5 11/22/2021 09:56 AM  ? CHOLHDL 4.8 05/16/2016 10:27 AM  ? LDLCALC 135 (H) 11/22/2021 09:56 AM  ? ? ?Wt Readings from Last 3 Encounters:  ?12/15/21 162 lb (73.5 kg)  ?10/03/21 165 lb 8 oz (75.1 kg)  ?06/14/21 157 lb (71.2 kg)  ?  ? ?Exam:   ? ?Vital Signs:  Ht 5\' 7"  (1.702 m)   Wt 162 lb (73.5 kg)   BMI 25.37 kg/m?   ? ?General appearance: alert and no distress ?Lungs: no visual respiratory difficulty ?Abdomen: normal weight ?Extremities: extremities normal, atraumatic, no cyanosis or edema ?Skin: Skin color, texture, turgor normal. No rashes or lesions ?Neurologic: Grossly normal ? ?ASSESSMENT & PLAN:   ? ?Mixed dyslipidemia ?Statin intolerant-myalgias ?Hereditary spastic paraplegia ? ?Mr. Scarfo has had an interval increase in his cholesterol. He is compliant with medication. I suspect this can only be related to diet and activity. No other new meds. No significant thyroid dysfunction. Have suggested adding plant phytosterols and fiber to his diet. He cannot take statins. I do not think he will qualify for PCSK9i or Nexletol given lack of known ASCVD.  Will plan repeat lipid in 6 months. Follow-up then. ? ?COVID-19 Education: ?The signs and symptoms of COVID-19 were discussed with the patient and how to seek care for testing (follow up with PCP or arrange E-visit).  The  importance of social distancing was discussed today. ? ?Patient Risk:   ?After full review of this patients clinical status, I feel that they are at least moderate risk at this time. ? ?Time:   ?Today, I ha

## 2021-12-15 NOTE — Patient Instructions (Signed)
Medication Instructions:  ?Your physician recommends that you continue on your current medications as directed. Please refer to the Current Medication list given to you today.  ? ? ?*If you need a refill on your cardiac medications before your next appointment, please call your pharmacy* ? ? ?Lab Work: ?Your physician recommends that you return for lab work in: 6 months for FASTING cholesterol levels. ? ?If you have labs (blood work) drawn today and your tests are completely normal, you will receive your results only by: ?MyChart Message (if you have MyChart) OR ?A paper copy in the mail ?If you have any lab test that is abnormal or we need to change your treatment, we will call you to review the results. ? ? ?Follow-Up: ?At [facility], you and your health needs are our priority.  As part of our continuing mission to provide you with exceptional heart care, we have created designated Provider Care Teams.  These Care Teams include your primary Cardiologist (physician) and Advanced Practice Providers (APPs -  Physician Assistants and Nurse Practitioners) who all work together to provide you with the care you need, when you need it. ? ?We recommend signing up for the patient portal called "MyChart".  Sign up information is provided on this After Visit Summary.  MyChart is used to connect with patients for Virtual Visits (Telemedicine).  Patients are able to view lab/test results, encounter notes, upcoming appointments, etc.  Non-urgent messages can be sent to your provider as well.   ?To learn more about what you can do with MyChart, go to [email or web address].   ? ?Your next appointment:   ?Wednesday, November 1st @ 8:15am ? ?The format for your next appointment:   ?Virtual Visit  ? ?Provider:   ?Dr. Rennis Golden ? ?Other Instructions ?Lipid Clinic- ? ?Dr. Rennis Golden recommends Cholest-off (over the counter) and increase fiber in your diet. ?

## 2021-12-24 ENCOUNTER — Other Ambulatory Visit: Payer: Self-pay | Admitting: Internal Medicine

## 2021-12-25 ENCOUNTER — Other Ambulatory Visit (HOSPITAL_COMMUNITY): Payer: Self-pay

## 2021-12-25 MED ORDER — EZETIMIBE 10 MG PO TABS
10.0000 mg | ORAL_TABLET | Freq: Every day | ORAL | 3 refills | Status: DC
  Filled 2021-12-25: qty 90, 90d supply, fill #0
  Filled 2022-03-25: qty 90, 90d supply, fill #1
  Filled 2022-06-23: qty 90, 90d supply, fill #2
  Filled 2022-10-07: qty 90, 90d supply, fill #3

## 2022-01-02 ENCOUNTER — Other Ambulatory Visit (HOSPITAL_COMMUNITY): Payer: Self-pay

## 2022-01-13 ENCOUNTER — Telehealth: Payer: No Typology Code available for payment source | Admitting: Nurse Practitioner

## 2022-01-13 DIAGNOSIS — K591 Functional diarrhea: Secondary | ICD-10-CM | POA: Diagnosis not present

## 2022-01-13 NOTE — Progress Notes (Signed)
We are sorry that you are not feeling well.  Here is how we plan to help!  Based on what you have shared with me it looks like you have Acute Infectious Diarrhea.  Most cases of acute diarrhea are due to infections with virus and bacteria and are self-limited conditions lasting less than 14 days.  For your symptoms you may take Imodium 2 mg tablets that are over the counter at your local pharmacy. Take two tablet now and then one after each loose stool up to 6 a day.  Antibiotics are not needed for most people with diarrhea.   HOME CARE We recommend changing your diet to help with your symptoms for the next few days. Drink plenty of fluids that contain water salt and sugar. Sports drinks such as Gatorade may help.  You may try broths, soups, bananas, applesauce, soft breads, mashed potatoes or crackers.  You are considered infectious for as long as the diarrhea continues. Hand washing or use of alcohol based hand sanitizers is recommend. It is best to stay out of work or school until your symptoms stop.   GET HELP RIGHT AWAY If you have dark yellow colored urine or do not pass urine frequently you should drink more fluids.   If your symptoms worsen  If you feel like you are going to pass out (faint) You have a new problem  MAKE SURE YOU  Understand these instructions. Will watch your condition. Will get help right away if you are not doing well or get worse.  Thank you for choosing an e-visit.  Your e-visit answers were reviewed by a board certified advanced clinical practitioner to complete your personal care plan. Depending upon the condition, your plan could have included both over the counter or prescription medications.  Please review your pharmacy choice. Make sure the pharmacy is open so you can pick up prescription now. If there is a problem, you may contact your provider through [employer] and have the prescription routed to another pharmacy.  Your safety is important  to [place]. If you have drug allergies check your prescription carefully.   For the next 24 hours you can use MyChart to ask questions about today's visit, request a non-urgent call back, or ask for a work or school excuse. You will get an email in the next two days asking about your experience. I hope that your e-visit has been valuable and will speed your recovery.  5-10 minutes spent reviewing and documenting in chart.

## 2022-01-15 ENCOUNTER — Other Ambulatory Visit (HOSPITAL_COMMUNITY): Payer: Self-pay

## 2022-01-17 ENCOUNTER — Other Ambulatory Visit (HOSPITAL_COMMUNITY): Payer: Self-pay

## 2022-01-17 ENCOUNTER — Encounter: Payer: Self-pay | Admitting: Physician Assistant

## 2022-01-17 ENCOUNTER — Ambulatory Visit (INDEPENDENT_AMBULATORY_CARE_PROVIDER_SITE_OTHER): Payer: No Typology Code available for payment source | Admitting: Physician Assistant

## 2022-01-17 VITALS — BP 118/76 | HR 92 | Temp 97.7°F | Ht 67.0 in | Wt 161.0 lb

## 2022-01-17 DIAGNOSIS — R197 Diarrhea, unspecified: Secondary | ICD-10-CM | POA: Diagnosis not present

## 2022-01-17 NOTE — Patient Instructions (Signed)
Diarrhea, Adult ?Diarrhea is when you pass loose and watery poop (stool) often. Diarrhea can make you feel weak and cause you to lose water in your body (get dehydrated). Losing water in your body can cause you to: ?Feel tired and thirsty. ?Have a dry mouth. ?Go pee (urinate) less often. ?Diarrhea often lasts 2-3 days. However, it can last longer if it is a sign of something more serious. It is important to treat your diarrhea as told by your doctor. ?Follow these instructions at home: ?Eating and drinking ? ?  ? ?Follow these instructions as told by your doctor: ?Take an ORS (oral rehydration solution). This is a drink that helps you replace fluids and minerals your body lost. It is sold at pharmacies and stores. ?Drink plenty of fluids, such as: ?Water. ?Ice chips. ?Diluted fruit juice. ?Low-calorie sports drinks. ?Milk, if you want. ?Avoid drinking fluids that have a lot of sugar or caffeine in them. ?Eat bland, easy-to-digest foods in small amounts as you are able. These foods include: ?Bananas. ?Applesauce. ?Rice. ?Low-fat (lean) meats. ?Toast. ?Crackers. ?Avoid alcohol. ?Avoid spicy or fatty foods. ? ?Medicines ?Take over-the-counter and prescription medicines only as told by your doctor. ?If you were prescribed an antibiotic medicine, take it as told by your doctor. Do not stop using the antibiotic even if you start to feel better. ?General instructions ? ?Wash your hands often using soap and water. If soap and water are not available, use a hand sanitizer. Others in your home should wash their hands as well. Hands should be washed: ?After using the toilet or changing a diaper. ?Before preparing, cooking, or serving food. ?While caring for a sick person. ?While visiting someone in a hospital. ?Drink enough fluid to keep your pee (urine) pale yellow. ?Rest at home while you get better. ?Take a warm bath to help with any burning or pain from having diarrhea. ?Watch your condition for any changes. ?Keep all  follow-up visits as told by your doctor. This is important. ?Contact a doctor if: ?You have a fever. ?Your diarrhea gets worse. ?You have new symptoms. ?You cannot keep fluids down. ?You feel light-headed or dizzy. ?You have a headache. ?You have muscle cramps. ?Get help right away if: ?You have chest pain. ?You feel very weak or you pass out (faint). ?You have bloody or black poop or poop that looks like tar. ?You have very bad pain, cramping, or bloating in your belly (abdomen). ?You have trouble breathing or you are breathing very quickly. ?Your heart is beating very quickly. ?Your skin feels cold and clammy. ?You feel confused. ?You have signs of losing too much water in your body, such as: ?Dark pee, very little pee, or no pee. ?Cracked lips. ?Dry mouth. ?Sunken eyes. ?Sleepiness. ?Weakness. ?Summary ?Diarrhea is when you pass loose and watery poop (stool) often. ?Diarrhea can make you feel weak and cause you to lose water in your body (get dehydrated). ?Take an ORS (oral rehydration solution). This is a drink that is sold at pharmacies and stores. ?Eat bland, easy-to-digest foods in small amounts as you are able. ?Contact a doctor if your condition gets worse. Get help right away if you have signs that you have lost too much water in your body. ?This information is not intended to replace advice given to you by your health care provider. Make sure you discuss any questions you have with your health care provider. ?Document Revised: 02/01/2021 Document Reviewed: 02/01/2021 ?Elsevier Patient Education ? 2023 Elsevier Inc[employer] ? ?

## 2022-01-17 NOTE — Progress Notes (Signed)
  Established patient acute visit   Patient: Terry Howard   DOB: 1956/04/19   66 y.o. Male  MRN: [id number] Visit Date: 01/17/2022  Chief Complaint  Patient presents with   Diarrhea   Subjective    HPI  Patient presenting with c/o diarrhea x 2 weeks. Patient denies bloody stools. States had 5-6 stools per day about 10 days ago, currently has 2 stool per day and are more firmer than it used to be. No fever, nausea or vomiting. Reports initially had bloating which has resolved. Has been taking imodium which has helped. Last dose was yesterday.   Medications: Outpatient Medications Prior to Visit  Medication Sig   acyclovir (ZOVIRAX) 800 MG tablet Take 1 tablet (800 mg total) by mouth daily.   baclofen (LIORESAL) 10 MG tablet Take 1 tablet (10 mg total) by mouth at bedtime.   clonazePAM (KLONOPIN) 1 MG tablet Take 1 tablet (1 mg total) by mouth at bedtime.   ezetimibe (ZETIA) 10 MG tablet Take 1 tablet (10 mg total) by mouth daily.   gabapentin (NEURONTIN) 300 MG capsule Take 1 capsule (300 mg total) by mouth in the morning AND 2 capsules (600 mg total) every evening.   rOPINIRole (REQUIP) 3 MG tablet Take 1 tablet (3 mg total) by mouth at bedtime.   No facility-administered medications prior to visit.    Review of Systems Review of Systems:  A fourteen system review of systems was performed and found to be positive as per HPI.     Objective    BP 118/76   Pulse 92   Temp 97.7 F (36.5 C)   Ht 5\' 7"  (1.702 m)   Wt 161 lb (73 kg)   SpO2 98%   BMI 25.22 kg/m    Physical Exam  General:  Pleasant and cooperative, appropriate for stated age.  Neuro:  Alert and oriented,  extra-ocular muscles intact  HEENT:  Normocephalic, atraumatic, neck supple  Skin:  no gross rash, warm, pink. Abdomen:  +BS, non-tender, non-distended, no guarding or rebound tenderness Respiratory: Speaking in full sentences, unlabored. Vascular:  Ext warm, no cyanosis apprec.; cap RF less 2  sec. Psych:  No HI/SI, judgement and insight good, Euthymic mood. Full Affect.   No results found for any visits on 01/17/22.  Assessment & Plan     Patient reports feeling better today and diarrhea has improved. Discussed with patient possibly viral gastroenteritis. Recommend to continue with good hydration and recommend to hold imodium unless his stools become loose/watery again. If symptoms worsen recommend obtaining stool studies.    Return if symptoms worsen or fail to improve.        01/19/22, PA-C  [facility] [phone] (phone) [phone] (fax)  [facility]

## 2022-01-29 ENCOUNTER — Other Ambulatory Visit (HOSPITAL_COMMUNITY): Payer: Self-pay

## 2022-02-10 ENCOUNTER — Telehealth: Payer: No Typology Code available for payment source | Admitting: Family

## 2022-02-10 DIAGNOSIS — B9689 Other specified bacterial agents as the cause of diseases classified elsewhere: Secondary | ICD-10-CM | POA: Diagnosis not present

## 2022-02-10 DIAGNOSIS — J208 Acute bronchitis due to other specified organisms: Secondary | ICD-10-CM | POA: Diagnosis not present

## 2022-02-10 MED ORDER — PREDNISONE 10 MG (21) PO TBPK: ORAL_TABLET | ORAL | 0 refills | Status: DC

## 2022-02-10 MED ORDER — AZITHROMYCIN 250 MG PO TABS: ORAL_TABLET | ORAL | 0 refills | Status: DC

## 2022-02-10 MED ORDER — BENZONATATE 100 MG PO CAPS: 100.0000 mg | ORAL_CAPSULE | Freq: Three times a day (TID) | ORAL | 0 refills | Status: DC | PRN

## 2022-02-10 NOTE — Progress Notes (Signed)
We are sorry that you are not feeling well.  Here is how we plan to help!  Based on your presentation I believe you most likely have A cough due to bacteria.  When patients have a fever and a productive cough with a change in color or increased sputum production, we are concerned about bacterial bronchitis.  If left untreated it can progress to pneumonia.  If your symptoms do not improve with your treatment plan it is important that you contact your provider.   I have prescribed Azithromyin 250 mg: two tablets now and then one tablet daily for 4 additonal days    In addition you may use A non-prescription cough medication called Robitussin DAC. Take 2 teaspoons every 8 hours or Delsym: take 2 teaspoons every 12 hours., A non-prescription cough medication called Mucinex DM: take 2 tablets every 12 hours., and A prescription cough medication called Tessalon Perles 100mg. You may take 1-2 capsules every 8 hours as needed for your cough.  Prednisone 10 mg daily for 6 days (see taper instructions below)  Directions for 6 day taper: Day 1: 2 tablets before breakfast, 1 after both lunch & dinner and 2 at bedtime Day 2: 1 tab before breakfast, 1 after both lunch & dinner and 2 at bedtime Day 3: 1 tab at each meal & 1 at bedtime Day 4: 1 tab at breakfast, 1 at lunch, 1 at bedtime Day 5: 1 tab at breakfast & 1 tab at bedtime Day 6: 1 tab at breakfast  From your responses in the eVisit questionnaire you describe inflammation in the upper respiratory tract which is causing a significant cough.  This is commonly called Bronchitis and has four common causes:   Allergies Viral Infections Acid Reflux Bacterial Infection Allergies, viruses and acid reflux are treated by controlling symptoms or eliminating the cause. An example might be a cough caused by taking certain blood pressure medications. You stop the cough by changing the medication. Another example might be a cough caused by acid reflux. Controlling the  reflux helps control the cough.  USE OF BRONCHODILATOR ("RESCUE") INHALERS: There is a risk from using your bronchodilator too frequently.  The risk is that over-reliance on a medication which only relaxes the muscles surrounding the breathing tubes can reduce the effectiveness of medications prescribed to reduce swelling and congestion of the tubes themselves.  Although you feel brief relief from the bronchodilator inhaler, your asthma may actually be worsening with the tubes becoming more swollen and filled with mucus.  This can delay other crucial treatments, such as oral steroid medications. If you need to use a bronchodilator inhaler daily, several times per day, you should discuss this with your provider.  There are probably better treatments that could be used to keep your asthma under control.     HOME CARE Only take medications as instructed by your medical team. Complete the entire course of an antibiotic. Drink plenty of fluids and get plenty of rest. Avoid close contacts especially the very young and the elderly Cover your mouth if you cough or cough into your sleeve. Always remember to wash your hands A steam or ultrasonic humidifier can help congestion.   GET HELP RIGHT AWAY IF: You develop worsening fever. You become short of breath You cough up blood. Your symptoms persist after you have completed your treatment plan MAKE SURE YOU  Understand these instructions. Will watch your condition. Will get help right away if you are not doing well or get worse.      Thank you for choosing an e-visit.  Your e-visit answers were reviewed by a board certified advanced clinical practitioner to complete your personal care plan. Depending upon the condition, your plan could have included both over the counter or prescription medications.  Please review your pharmacy choice. Make sure the pharmacy is open so you can pick up prescription now. If there is a problem, you may contact your  provider through MyChart messaging and have the prescription routed to another pharmacy.  Your safety is important to us. If you have drug allergies check your prescription carefully.   For the next 24 hours you can use MyChart to ask questions about today's visit, request a non-urgent call back, or ask for a work or school excuse. You will get an email in the next two days asking about your experience. I hope that your e-visit has been valuable and will speed your recovery.  .Approximately 5 minutes was spent documenting and reviewing patient's chart.    

## 2022-02-26 ENCOUNTER — Other Ambulatory Visit (HOSPITAL_COMMUNITY): Payer: Self-pay

## 2022-03-11 ENCOUNTER — Other Ambulatory Visit: Payer: Self-pay | Admitting: Neurology

## 2022-03-11 DIAGNOSIS — G4700 Insomnia, unspecified: Secondary | ICD-10-CM

## 2022-03-11 DIAGNOSIS — F339 Major depressive disorder, recurrent, unspecified: Secondary | ICD-10-CM

## 2022-03-12 ENCOUNTER — Other Ambulatory Visit (HOSPITAL_COMMUNITY): Payer: Self-pay

## 2022-03-12 ENCOUNTER — Other Ambulatory Visit: Payer: Self-pay | Admitting: Neurology

## 2022-03-12 ENCOUNTER — Encounter: Payer: Self-pay | Admitting: Neurology

## 2022-03-12 DIAGNOSIS — F339 Major depressive disorder, recurrent, unspecified: Secondary | ICD-10-CM

## 2022-03-12 DIAGNOSIS — G4700 Insomnia, unspecified: Secondary | ICD-10-CM

## 2022-03-12 NOTE — Telephone Encounter (Signed)
Verify Drug Registry For Clonazepam 1 Mg Tablet Last Filled: 12/12/2021 Quantity: 90 tablets for 90 days Last appointment: 10/03/2021 Next appointment: 04/04/2022

## 2022-03-13 ENCOUNTER — Other Ambulatory Visit (HOSPITAL_COMMUNITY): Payer: Self-pay

## 2022-03-13 MED ORDER — CLONAZEPAM 1 MG PO TABS
1.0000 mg | ORAL_TABLET | Freq: Every day | ORAL | 0 refills | Status: DC
  Filled 2022-03-13: qty 90, 90d supply, fill #0

## 2022-03-26 ENCOUNTER — Other Ambulatory Visit (HOSPITAL_COMMUNITY): Payer: Self-pay

## 2022-03-26 ENCOUNTER — Encounter: Payer: Self-pay | Admitting: Neurology

## 2022-04-01 ENCOUNTER — Other Ambulatory Visit: Payer: Self-pay | Admitting: Neurology

## 2022-04-02 ENCOUNTER — Other Ambulatory Visit: Payer: Self-pay | Admitting: Neurology

## 2022-04-02 ENCOUNTER — Other Ambulatory Visit (HOSPITAL_COMMUNITY): Payer: Self-pay

## 2022-04-03 ENCOUNTER — Ambulatory Visit: Payer: No Typology Code available for payment source | Admitting: Neurology

## 2022-04-03 ENCOUNTER — Other Ambulatory Visit (HOSPITAL_COMMUNITY): Payer: Self-pay

## 2022-04-03 MED ORDER — BACLOFEN 10 MG PO TABS
10.0000 mg | ORAL_TABLET | Freq: Every day | ORAL | 5 refills | Status: DC
  Filled 2022-04-03: qty 30, 30d supply, fill #0

## 2022-04-03 NOTE — Progress Notes (Unsigned)
   Virtual Visit via Video Note  I connected with Terry Howard on 04/03/22 at  7:45 AM EDT by a video enabled telemedicine application and verified that I am speaking with the correct person using two identifiers.  Location: Patient: at his home Provider: in the office    I discussed the limitations of evaluation and management by telemedicine and the availability of in person appointments. The patient expressed understanding and agreed to proceed.  History of Present Illness: Today April 04, 2022 SS: Terry Howard is here today for follow-up. We tried to get Horizant covered, insurance wouldn't cover. Feels moving slower, in combination with RLS. Doesn't drive as much, can be difficult to operate pedals lifting legs. Uses walker in the house. With canes, gets a lot of wear and tear due to relying on it, they bend or break. Harder to work out. He had a tree fall down, he was able to take care of this. He has a Technical sales engineer, he has a Chief Strategy Officer. No falls, he does trip more often especially in his shop. He remains active, but he is slowing down, takes longer to complete tasks.   Update 10/03/21 SS: Terry Howard here today for follow-up.  He is on Neupro patch, Requip, gabapentin, clonazepam for his symptoms. Claims Neupro patches caused rashes despite the location, was retaing fluid. RLS can bother during the day, but mostly at bedtime, never knows when its going to hit. Has insomnia, goes to bed at 11 PM, gets up around 1 or 2 PM nightly to sleep in family room. Is on disability, has small lawn mowing business. Is tripping more, uses quad cane. He tries to use weight machine 3 times a week. He has to be able to load his yard equipment. In the past he was on baclofen but stopped it himself back in 2021 because he felt the gabapentin worked better. He has a scooter and walker he can use. Tried AFO braces but made him trip. Did PT at [facility] rehab, claims wasn't helpful. Here today alone.     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  Restless leg syndrome -Increase baclofen 10 mg 3 times daily -Has not been interested in baclofen pump in the past, not clear if he has been formally evaluated -Previously has done Botox injections for spasticity without benefit -Continue current medications:  Requip 3 mg at bedtime  Gabapentin 300 mg am/600 MG pm  Klonopin 1 mg at bedtime -Insurance would not cover Horizant, Neupro patch caused rash and fluid retention  Follow Up Instructions: 6 months with Dr. Marjory Lies, will ask the front to set up in office appointment   I discussed the assessment and treatment plan with the patient. The patient was provided an opportunity to ask questions and all were answered. The patient agreed with the plan and demonstrated an understanding of the instructions.   The patient was advised to call back or seek an in-person evaluation if the symptoms worsen or if the condition fails to improve as anticipated.  Otila Kluver, DNP  [facility] [address] [place], [place] [address] [phone]

## 2022-04-04 ENCOUNTER — Telehealth (INDEPENDENT_AMBULATORY_CARE_PROVIDER_SITE_OTHER): Payer: No Typology Code available for payment source | Admitting: Neurology

## 2022-04-04 ENCOUNTER — Other Ambulatory Visit (HOSPITAL_COMMUNITY): Payer: Self-pay

## 2022-04-04 DIAGNOSIS — G2581 Restless legs syndrome: Secondary | ICD-10-CM | POA: Diagnosis not present

## 2022-04-04 DIAGNOSIS — G114 Hereditary spastic paraplegia: Secondary | ICD-10-CM

## 2022-04-04 MED ORDER — ROPINIROLE HCL 3 MG PO TABS
3.0000 mg | ORAL_TABLET | Freq: Every day | ORAL | 1 refills | Status: DC
  Filled 2022-04-04: qty 90, 90d supply, fill #0
  Filled 2022-07-16: qty 90, 90d supply, fill #1

## 2022-04-04 MED ORDER — BACLOFEN 10 MG PO TABS
10.0000 mg | ORAL_TABLET | Freq: Three times a day (TID) | ORAL | 5 refills | Status: DC
  Filled 2022-04-04 – 2022-04-11 (×5): qty 90, 30d supply, fill #0
  Filled 2022-05-21: qty 90, 30d supply, fill #1
  Filled 2022-06-23: qty 90, 30d supply, fill #2
  Filled 2022-08-19: qty 90, 30d supply, fill #3
  Filled 2022-10-08: qty 90, 30d supply, fill #4
  Filled 2022-12-22: qty 90, 30d supply, fill #5

## 2022-04-04 MED ORDER — GABAPENTIN 300 MG PO CAPS
ORAL_CAPSULE | ORAL | 5 refills | Status: DC
  Filled 2022-04-04: qty 90, 30d supply, fill #0
  Filled 2022-09-17: qty 90, 30d supply, fill #1
  Filled 2023-02-17: qty 90, 30d supply, fill #2

## 2022-04-04 NOTE — Patient Instructions (Signed)
Increase baclofen 10 mg 3 times daily to help with spasticity Continue other medications Call if you have any worsening symptoms, medication side effects I like for you to come back in 6 months to meet Dr. Marjory Lies for an office visit, our office will call you to set up an appointment

## 2022-04-04 NOTE — Progress Notes (Signed)
I reviewed note and agree with plan.   Suanne Marker, MD 04/04/2022, 9:22 AM Certified in Neurology, Neurophysiology and Neuroimaging  [facility] [address], [address] [place], [place] [address] [phone]

## 2022-04-05 ENCOUNTER — Other Ambulatory Visit (HOSPITAL_COMMUNITY): Payer: Self-pay

## 2022-04-06 ENCOUNTER — Other Ambulatory Visit (HOSPITAL_COMMUNITY): Payer: Self-pay

## 2022-04-10 ENCOUNTER — Other Ambulatory Visit (HOSPITAL_COMMUNITY): Payer: Self-pay

## 2022-04-11 ENCOUNTER — Other Ambulatory Visit (HOSPITAL_COMMUNITY): Payer: Self-pay

## 2022-04-24 ENCOUNTER — Other Ambulatory Visit: Payer: Self-pay | Admitting: *Deleted

## 2022-04-24 DIAGNOSIS — E785 Hyperlipidemia, unspecified: Secondary | ICD-10-CM

## 2022-05-21 ENCOUNTER — Other Ambulatory Visit: Payer: Self-pay

## 2022-05-21 ENCOUNTER — Other Ambulatory Visit (HOSPITAL_COMMUNITY): Payer: Self-pay

## 2022-05-21 ENCOUNTER — Telehealth: Payer: Self-pay

## 2022-05-21 DIAGNOSIS — E785 Hyperlipidemia, unspecified: Secondary | ICD-10-CM

## 2022-05-21 NOTE — Telephone Encounter (Signed)
Pt calling to get an appt for lab for a Lipid panel ordered by Dr. Debara Pickett.   Maritza approved order the Lipid panel for pt to have lab drawn at this location.  Please advise

## 2022-05-21 NOTE — Telephone Encounter (Signed)
Future lab orders were added.

## 2022-05-30 ENCOUNTER — Other Ambulatory Visit: Payer: No Typology Code available for payment source

## 2022-05-30 DIAGNOSIS — E785 Hyperlipidemia, unspecified: Secondary | ICD-10-CM

## 2022-05-31 LAB — LIPID PANEL
Chol/HDL Ratio: 4.5 ratio (ref 0.0–5.0)
Cholesterol, Total: 189 mg/dL (ref 100–199)
HDL: 42 mg/dL (ref >39)
LDL Chol Calc (NIH): 132 mg/dL — ABNORMAL HIGH (ref 0–99)
Triglycerides: 79 mg/dL (ref 0–149)
VLDL Cholesterol Cal: 15 mg/dL (ref 5–40)

## 2022-06-06 ENCOUNTER — Ambulatory Visit: Payer: No Typology Code available for payment source | Attending: Internal Medicine | Admitting: Internal Medicine

## 2022-06-06 ENCOUNTER — Encounter: Payer: Self-pay | Admitting: Internal Medicine

## 2022-06-06 VITALS — BP 141/84 | HR 62 | Ht 67.0 in | Wt 164.0 lb

## 2022-06-06 DIAGNOSIS — E785 Hyperlipidemia, unspecified: Secondary | ICD-10-CM

## 2022-06-06 DIAGNOSIS — M791 Myalgia, unspecified site: Secondary | ICD-10-CM | POA: Diagnosis not present

## 2022-06-06 DIAGNOSIS — G114 Hereditary spastic paraplegia: Secondary | ICD-10-CM

## 2022-06-06 DIAGNOSIS — T466X5D Adverse effect of antihyperlipidemic and antiarteriosclerotic drugs, subsequent encounter: Secondary | ICD-10-CM

## 2022-06-06 NOTE — Progress Notes (Signed)
Virtual Visit via Video Note   This visit type was conducted due to national recommendations for restrictions regarding the COVID-19 Pandemic (e.g. social distancing) in an effort to limit this patient's exposure and mitigate transmission in our community.  Due to his co-morbid illnesses, this patient is at least at moderate risk for complications without adequate follow up.  This format is felt to be most appropriate for this patient at this time.  The patient does have access to video technology.  All issues noted in this document were discussed and addressed.  A limited physical exam could be performed with this format.  Please refer to the patient's chart for his  consent to telehealth for [facility].   Date:  06/06/2022   ID:  Terry Howard, DOB [birth year], MRN [id number] The patient was identified using 2 identifiers.  Evaluation Performed:  Follow-Up Visit  Patient Location:  [address] [place] [address]  Provider location:   [address], [place] [address], [place] [address]  PCP:  Terry Reid, PA-C  Cardiologist:  None Electrophysiologist:  None   Chief Complaint:  Follow-up lipids  History of Present Illness:    Terry Howard is a 66 y.o. male who presents via audio/video conferencing for a telehealth visit today.  Terry Howard is a pleasant 66 year old male kindly referred to the lipid clinic for evaluation and management of dyslipidemia.  History is significant for unfortunate hereditary spastic paraparesis, dyslipidemia and other neurologic conditions, who presents for evaluation and management of dyslipidemia.  Terry Howard has some longstanding dyslipidemia but no known coronary disease.  There is a questionable family history of heart disease having noted that his mother died with cancer but had high cholesterol and his father having died of complications related to alcohol use.  Most recently Terry Howard had a cholesterol profile showing  total cholesterol 225, HDL 39, LDL 165 and triglycerides 118.  He reports overall pretty healthy diet, however is not able to exercise with the lower extremities to do a lot of aerobic activity but does exercise regularly of his upper extremities.  He has no history of hypertension, diabetes, tobacco abuse or other cardiovascular risk factors.  Weight is actually appropriate and blood pressure is well controlled today.  Unfortunately, [name] has been on a number of different statin medications in the past all of which caused worsening myalgias and spasticity of his lower extremities.  He reported he would not take any additional statins.  He also brought a list of possible statin alternatives with him to the office today to review.  07/15/2020  Terry Howard is seen today via telephone follow-up.  He seems to have done well on ezetimibe without any significant side effects.  This has further lowered his cholesterol, with total cholesterol now 199, triglycerides 98, HDL 43 and LDL 138.  Ultimately I would recommend a target of less than 100.  We discussed other options and at this point he is hesitant to consider other treatments, particularly statins due to his spastic paraparesis.  12/15/2021  Terry Howard continues to feel well. He has been compliant with the zetia and continues to exercise and eat healthy, however, his cholesterol has increased. In April, TC 201, TG 114, HDL 45 and LDL 135 (up from 107).  He was very surprised that the numbers were not improved. He did have repeat TSH, which is barely elevated, but t3 and free T4 are normal. He was noted to have low Vit D last year  and takes a daily multivitamin.  06/06/2022  Terry Howard is seen today via virtual visit.  He continues on ezetimibe.  He has tried supplements and we have regulated his thyroid and other issues however his cholesterol has not significantly improved.  LDL remains in the 130s despite total cholesterol now below 200.  There  is genetic predisposition for this.  Although he does not have a lot of traditional cardiovascular risk factors and no known coronary disease, there was stroke in his father who had intercerebral hemorrhage.  Given his age he is at least an intermediate risk and I would target his LDL to below 100.  We discussed today options that might help him achieve that.   The patient does not have symptoms concerning for COVID-19 infection (fever, chills, cough, or new SHORTNESS OF BREATH).    Prior CV studies:   The following studies were reviewed today:  Chart reviewed  PMHx:  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HCC)    Hyperlipidemia    Internal hemorrhoids    Mild carpal tunnel syndrome of right wrist    Neurogenic bladder    RLS (restless legs syndrome)     Past Surgical History:  Procedure Laterality Date   APPENDECTOMY     BREAST SURGERY Bilateral    cysts   COLONOSCOPY     HEMORRHOID BANDING     KNEE ARTHROSCOPY     right   SHOULDER SURGERY     Rotator cuff, left   ULNAR TUNNEL RELEASE Left 09/10/2014   Procedure: LEFT CUBITAL TUNNEL RELEASE;  Surgeon: Marianna Payment, MD;  Location: [facility];  Service: Orthopedics;  Laterality: Left;    FAMHx:  Family History  Problem Relation Age of Onset   Cancer Mother        breast cancer   Other Mother    Hyperlipidemia Mother    Stroke Father        Intracranial hemorrhage   Other Brother    Other Brother    Other Brother    Colon cancer Neg Hx    Colon polyps Neg Hx    Esophageal cancer Neg Hx    Stomach cancer Neg Hx    Rectal cancer Neg Hx     SOCHx:   reports that he has never smoked. He has never used smokeless tobacco. He reports current alcohol use of about 7.0 standard drinks of alcohol per week. He reports that he does not  use drugs.  ALLERGIES:  Allergies  Allergen Reactions   Prozac [Fluoxetine Hcl]     tolerate well due to SE -felt sluggish, tired, and anxious    Pregabalin Other (See Comments)    Intolerance, somnolence   Statins Other (See Comments)    Other reaction(s): Muscle Pain Other reaction(s): Muscle Pain Other reaction(s): Muscle Pain    Neupro [Rotigotine] Rash    MEDS:  Current Meds  Medication Sig   acyclovir (ZOVIRAX) 800 MG tablet Take 1 tablet (800 mg total) by mouth daily.   baclofen (LIORESAL) 10 MG tablet Take 1 tablet (10 mg total) by mouth 3 (three) times daily.   clonazePAM (KLONOPIN) 1 MG tablet Take 1 tablet (1 mg total) by mouth at bedtime.   ezetimibe (ZETIA) 10  MG tablet Take 1 tablet (10 mg total) by mouth daily.   gabapentin (NEURONTIN) 300 MG capsule Take 1 capsule (300 mg total) by mouth in the morning AND 2 capsules (600 mg total) every evening.   rOPINIRole (REQUIP) 3 MG tablet Take 1 tablet (3 mg total) by mouth at bedtime.     ROS: Pertinent items noted in HPI and remainder of comprehensive ROS otherwise negative.  Labs/Other Tests and Data Reviewed:    Recent Labs: 11/22/2021: TSH 5.840   Recent Lipid Panel Lab Results  Component Value Date/Time   CHOL 189 05/30/2022 10:51 AM   TRIG 79 05/30/2022 10:51 AM   HDL 42 05/30/2022 10:51 AM   CHOLHDL 4.5 05/30/2022 10:51 AM   CHOLHDL 4.8 05/16/2016 10:27 AM   LDLCALC 132 (H) 05/30/2022 10:51 AM    Wt Readings from Last 3 Encounters:  06/06/22 164 lb (74.4 kg)  01/17/22 161 lb (73 kg)  12/15/21 162 lb (73.5 kg)     Exam:    Vital Signs:  BP (!) 141/84   Pulse 62   Ht 5\' 7"  (1.702 m)   Wt 164 lb (74.4 kg)   BMI 25.69 kg/m    General appearance: alert and no distress Lungs: no visual respiratory difficulty Abdomen: normal weight Extremities: extremities normal, atraumatic, no cyanosis or edema Skin: Skin color, texture, turgor normal. No rashes or lesions Neurologic: Grossly  normal  ASSESSMENT & PLAN:    Mixed dyslipidemia, goal LDL <100 Statin intolerant-myalgias Hereditary spastic paraplegia Intermediate 10 year risk by pooled cohort equations  Mr. Wiant continues to have persistently elevated LDL cholesterol despite some improvement across the board and overall cholesterol and improvement in triglycerides.  I think he has maximized his diet.  The remaining elevation LDL cholesterol is likely hereditary and he remains above his target LDL less than 100.  He is already on ezetimibe.  I think he would benefit from the addition of Nexletol to reach target since he is statin intolerant.  I do not think in the absence of demonstrated coronary artery disease he will qualify necessarily for a PCSK9 inhibitor, however as a statin intolerant patient he would meet the clear trial data with bempedoic acid.  We will reach out for prior authorization for this.  Plan repeat lipids in 6 months and follow-up afterwards.  Patient Risk:   After full review of this patients clinical status, I feel that they are at least moderate risk at this time.  Time:   Today, I have spent 25 minutes with the patient with telehealth technology discussing dyslipidemia.     Medication Adjustments/Labs and Tests Ordered: Current medicines are reviewed at length with the patient today.  Concerns regarding medicines are outlined above.   Tests Ordered: No orders of the defined types were placed in this encounter.   Medication Changes: No orders of the defined types were placed in this encounter.   Disposition:  in 6 month(s)  Pixie Casino, MD, [facility], [place] Director of the Advanced Lipid Disorders &  Cardiovascular Risk Reduction Clinic Diplomate of the American Board of Clinical Lipidology Attending Cardiologist  Direct Dial: [phone]  Fax: [phone]  Website:  [email or web address]  Pixie Casino, MD  06/06/2022 8:41 AM

## 2022-06-06 NOTE — Patient Instructions (Signed)
Medication Instructions:  Your physician recommends that you continue on your current medications as directed. Please refer to the Current Medication list given to you today.  We will begin a prior authorization for the [facility] and let you know the outcome.  *If you need a refill on your cardiac medications before your next appointment, please call your pharmacy*   Lab Work: Your physician recommends that you return in about 6 months to have the following lab drawn: Lipids If you have labs (blood work) drawn today and your tests are completely normal, you will receive your results only by: [place] (if you have MyChart) OR A paper copy in the mail If you have any lab test that is abnormal or we need to change your treatment, we will call you to review the results.   Testing/Procedures: NONE   Follow-Up: At [facility], you and your health needs are our priority.  As part of our continuing mission to provide you with exceptional heart care, we have created designated Provider Care Teams.  These Care Teams include your primary Cardiologist (physician) and Advanced Practice Providers (APPs -  Physician Assistants and Nurse Practitioners) who all work together to provide you with the care you need, when you need it.  We recommend signing up for the patient portal called "MyChart".  Sign up information is provided on this After Visit Summary.  MyChart is used to connect with patients for Virtual Visits (Telemedicine).  Patients are able to view lab/test results, encounter notes, upcoming appointments, etc.  Non-urgent messages can be sent to your provider as well.   To learn more about what you can do with MyChart, go to [email or web address].    Your next appointment:   6 month(s)  The format for your next appointment:   Virtual Visit   Provider:   Lyman Bishop, MD

## 2022-06-06 NOTE — Addendum Note (Signed)
Addended by: Linton Ham on: 06/06/2022 08:51 AM   Modules accepted: Orders

## 2022-06-10 ENCOUNTER — Other Ambulatory Visit: Payer: Self-pay | Admitting: Neurology

## 2022-06-10 DIAGNOSIS — F339 Major depressive disorder, recurrent, unspecified: Secondary | ICD-10-CM

## 2022-06-10 DIAGNOSIS — G4700 Insomnia, unspecified: Secondary | ICD-10-CM

## 2022-06-12 ENCOUNTER — Other Ambulatory Visit (HOSPITAL_COMMUNITY): Payer: Self-pay

## 2022-06-12 ENCOUNTER — Encounter: Payer: Self-pay | Admitting: Neurology

## 2022-06-12 MED ORDER — CLONAZEPAM 1 MG PO TABS
1.0000 mg | ORAL_TABLET | Freq: Every day | ORAL | 0 refills | Status: DC
  Filled 2022-06-12: qty 90, 90d supply, fill #0

## 2022-06-12 NOTE — Telephone Encounter (Signed)
Patient is due for a refill on klonopin. Patient is up to date on his appointments. [place] Controlled Substance Registry checked and is appropriate.

## 2022-06-25 ENCOUNTER — Other Ambulatory Visit (HOSPITAL_COMMUNITY): Payer: Self-pay

## 2022-06-26 ENCOUNTER — Telehealth: Payer: Self-pay | Admitting: Pharmacist

## 2022-06-26 NOTE — Telephone Encounter (Signed)
Nexletol prior auth submitted, Key: [id number]

## 2022-06-26 NOTE — Telephone Encounter (Signed)
-----   Message from [place], [place] sent at 06/06/2022  8:58 AM EDT ----- Good Morning ,   Dr Rennis Golden would like for [place] to pursue PA for [facility] for the above patient.   Thanks so much!

## 2022-06-27 ENCOUNTER — Other Ambulatory Visit (HOSPITAL_COMMUNITY): Payer: Self-pay

## 2022-06-27 MED ORDER — NEXLETOL 180 MG PO TABS
1.0000 | ORAL_TABLET | Freq: Every day | ORAL | 11 refills | Status: DC
  Filled 2022-06-27: qty 30, 30d supply, fill #0
  Filled 2022-07-29: qty 30, 30d supply, fill #1
  Filled 2022-08-27 – 2022-09-03 (×3): qty 30, 30d supply, fill #2
  Filled 2022-10-07: qty 30, 30d supply, fill #3
  Filled 2022-11-04: qty 30, 30d supply, fill #4
  Filled 2022-12-08: qty 30, 30d supply, fill #5
  Filled 2023-01-06: qty 30, 30d supply, fill #6
  Filled 2023-02-10: qty 30, 30d supply, fill #7
  Filled 2023-03-14: qty 30, 30d supply, fill #8
  Filled 2023-04-14: qty 30, 30d supply, fill #9
  Filled 2023-05-12: qty 30, 30d supply, fill #10
  Filled 2023-06-14: qty 30, 30d supply, fill #11

## 2022-06-27 NOTE — Telephone Encounter (Signed)
Nexletol prior authorization has been approved through 06/26/23.

## 2022-07-04 ENCOUNTER — Other Ambulatory Visit (HOSPITAL_COMMUNITY): Payer: Self-pay

## 2022-07-04 ENCOUNTER — Encounter: Payer: Self-pay | Admitting: Internal Medicine

## 2022-07-17 ENCOUNTER — Other Ambulatory Visit: Payer: Self-pay

## 2022-07-17 ENCOUNTER — Other Ambulatory Visit (HOSPITAL_COMMUNITY): Payer: Self-pay

## 2022-07-31 ENCOUNTER — Other Ambulatory Visit: Payer: Self-pay

## 2022-08-21 ENCOUNTER — Other Ambulatory Visit (HOSPITAL_COMMUNITY): Payer: Self-pay

## 2022-08-21 DIAGNOSIS — R3915 Urgency of urination: Secondary | ICD-10-CM | POA: Diagnosis not present

## 2022-08-21 DIAGNOSIS — N5201 Erectile dysfunction due to arterial insufficiency: Secondary | ICD-10-CM | POA: Diagnosis not present

## 2022-08-21 MED ORDER — GEMTESA 75 MG PO TABS
75.0000 mg | ORAL_TABLET | Freq: Every day | ORAL | 3 refills | Status: DC
  Filled 2022-08-21 – 2022-10-08 (×5): qty 90, 90d supply, fill #0

## 2022-08-22 ENCOUNTER — Other Ambulatory Visit (HOSPITAL_COMMUNITY): Payer: Self-pay

## 2022-08-27 ENCOUNTER — Other Ambulatory Visit (HOSPITAL_COMMUNITY): Payer: Self-pay

## 2022-08-28 ENCOUNTER — Other Ambulatory Visit (HOSPITAL_COMMUNITY): Payer: Self-pay

## 2022-08-29 ENCOUNTER — Other Ambulatory Visit (HOSPITAL_BASED_OUTPATIENT_CLINIC_OR_DEPARTMENT_OTHER): Payer: Self-pay

## 2022-08-29 ENCOUNTER — Other Ambulatory Visit (HOSPITAL_COMMUNITY): Payer: Self-pay

## 2022-08-29 ENCOUNTER — Encounter: Payer: Self-pay | Admitting: Internal Medicine

## 2022-09-03 ENCOUNTER — Other Ambulatory Visit (HOSPITAL_COMMUNITY): Payer: Self-pay

## 2022-09-04 ENCOUNTER — Other Ambulatory Visit: Payer: Self-pay | Admitting: Neurology

## 2022-09-04 ENCOUNTER — Other Ambulatory Visit (HOSPITAL_COMMUNITY): Payer: Self-pay

## 2022-09-04 DIAGNOSIS — F339 Major depressive disorder, recurrent, unspecified: Secondary | ICD-10-CM

## 2022-09-04 DIAGNOSIS — G4700 Insomnia, unspecified: Secondary | ICD-10-CM

## 2022-09-04 MED ORDER — CLONAZEPAM 1 MG PO TABS
1.0000 mg | ORAL_TABLET | Freq: Every day | ORAL | 0 refills | Status: DC
  Filled 2022-09-04 – 2022-09-10 (×2): qty 90, 90d supply, fill #0

## 2022-09-04 NOTE — Telephone Encounter (Signed)
Verify Drug Registry For Clonazepam 1 Mg Tablet Last Filled: 06/12/2022 Quantity: 90 tablets for 90 days Last appointment: 10/03/2021 Next appointment: 10/16/2022

## 2022-09-05 ENCOUNTER — Other Ambulatory Visit (HOSPITAL_COMMUNITY): Payer: Self-pay

## 2022-09-07 ENCOUNTER — Other Ambulatory Visit (HOSPITAL_COMMUNITY): Payer: Self-pay

## 2022-09-10 ENCOUNTER — Other Ambulatory Visit (HOSPITAL_COMMUNITY): Payer: Self-pay

## 2022-09-10 ENCOUNTER — Other Ambulatory Visit: Payer: Self-pay

## 2022-09-12 ENCOUNTER — Other Ambulatory Visit (HOSPITAL_COMMUNITY): Payer: Self-pay

## 2022-09-17 ENCOUNTER — Other Ambulatory Visit: Payer: Self-pay

## 2022-10-08 ENCOUNTER — Other Ambulatory Visit (HOSPITAL_COMMUNITY): Payer: Self-pay

## 2022-10-10 ENCOUNTER — Other Ambulatory Visit: Payer: Self-pay

## 2022-10-10 ENCOUNTER — Other Ambulatory Visit (HOSPITAL_COMMUNITY): Payer: Self-pay

## 2022-10-11 ENCOUNTER — Other Ambulatory Visit (HOSPITAL_COMMUNITY): Payer: Self-pay

## 2022-10-11 ENCOUNTER — Other Ambulatory Visit: Payer: Self-pay | Admitting: Neurology

## 2022-10-16 ENCOUNTER — Encounter: Payer: Self-pay | Admitting: Diagnostic Neuroimaging

## 2022-10-16 ENCOUNTER — Ambulatory Visit: Payer: 59 | Admitting: Diagnostic Neuroimaging

## 2022-10-16 VITALS — BP 151/94 | HR 83 | Ht 67.0 in | Wt 170.0 lb

## 2022-10-16 DIAGNOSIS — G114 Hereditary spastic paraplegia: Secondary | ICD-10-CM

## 2022-10-16 NOTE — Patient Instructions (Addendum)
-   STOP BACLOFEN 10mg  at bedtime (not effective)  - STOP gabapentin 300mg  at bedtime (not effective)  - continue clonazepam (long term use for insomnia, anxiety)  - continue ropinirole (has been on long term, but not effective; may need to wean off in future)

## 2022-10-16 NOTE — Progress Notes (Signed)
[facility]  PATIENT: Terry Howard DOB: [birth year]  REFERRING CLINICIAN: Lorrene Reid, PA-C HISTORY FROM: patient  REASON FOR VISIT: new consult   HISTORICAL  CHIEF COMPLAINT:  Chief Complaint  Patient presents with   Follow-up    Rm 7, pt alone, here for follow up. Increasing the gabapentin caused drowsiness. Quit taking because of that. He feels there is nothing that helps but that he would discuss today.     HISTORY OF PRESENT ILLNESS:   UPDATE (10/16/22, VRP): Since last visit, doing about the same. Symptoms are progressive. Mainly weakness in legs. Denies major spasm. Also has some RLS symptoms in the evening. Clonazepam and ropirole helping a little. Baclofen and gabapentin not helping.   UPDATE 03/08/21 Dr. Jannifer Franklin: Terry Howard is a 67 year old right-handed white male with a history of hereditary spastic paraparesis.  The patient has had a slow gradual change in his ability to ambulate but he is staying quite active.  He has a yard care business.  The patient is currently off of baclofen, this did not help him.  Botox injections in the legs were not beneficial.  He claims that he had an evaluation for a baclofen pump in the past but the injections did not benefit him and he did not pursue surgery.  He was given gabapentin but stopped the medication as it was not helpful.  He finds good benefit with his restless legs with the Neupro patch, he is only on a 1 mg patch currently.  He takes 3 mg of Requip at night as well.  The patient takes clonazepam at night.  If he is particularly active during the day, his restless leg symptoms may be worse.  The patient falls on occasion.  He returns to this office for an evaluation.   History of Present Illness (02/24/07, LISA Sherrye Payor, MD): Terry Howard is a pleasant, 67 year old, Caucasian male seen initially 10/07/2006 for lower extremity spasticity, first noted in 1977 or 1978, that was gradually progressive. By  early 2008, he was tripping 2-3 times per week and wearing out the soles of his shoes every few weeks. At his initial visit, he was prescribed Zanaflex and physical therapy, and we revealed the genetic diagnosis of hereditary spastic paraplegia, secondary to SPG4 (spastin) mutation. He was unaware of any other involved family members. Serum copper level, ceruloplasmin, B12, and methylmalonic acid levels were normal. He saw physical therapy and tried out some ankle-foot orthotics, which worked out well.   At his last visit in 4/08, Terry Howard had increasing low back pain. He had lifted something in early March 2008 and developed increasing low back pain. The pain was focused in the left lumbosacral region and did not seem to radiate. It had not responded to Tylenol, capsaicin cream, or Aleve. We ordered an MRI of the T and L spine which showed a bulging disc at L4-5 causing mild-moderate spinal canal narrowing, mild-moderate right neural foraminal narrowing, and mild left neural foraminal narrowing. He was referred to Dr. Delilah Shan in neurosurgery to review his surgical options. Dr. Delilah Shan felt it wise to treat conservatively initially and so he was sent to the pain clinic for a L4-5 epidural steroid injection. This helped with the pain for about 2 days. Currently, he states that his pain is about 50% less than what it was before the injection. He has a follow-up appointment with Dr. Amaryllis Dyke scheduled for mid-August. Today, Terry Howard also complains of having "stiff knees." He stopped  taking the Zanaflex for uncertain reasons.   REVIEW OF SYSTEMS: Full 14 system review of systems performed and negative with exception of: as per HPI.  ALLERGIES: Allergies  Allergen Reactions   Prozac [Fluoxetine Hcl]     tolerate well due to SE -felt sluggish, tired, and anxious    Pregabalin Other (See Comments)    Intolerance, somnolence   Statins Other (See Comments)    Other reaction(s): Muscle Pain Other reaction(s):  Muscle Pain Other reaction(s): Muscle Pain    Neupro [Rotigotine] Rash    HOME MEDICATIONS: Outpatient Medications Prior to Visit  Medication Sig Dispense Refill   acyclovir (ZOVIRAX) 800 MG tablet Take 1 tablet (800 mg total) by mouth daily. 90 tablet 1   baclofen (LIORESAL) 10 MG tablet Take 1 tablet (10 mg total) by mouth 3 (three) times daily. 90 each 5   Bempedoic Acid (NEXLETOL) 180 MG TABS Take 1 tablet by mouth daily. 30 tablet 11   clonazePAM (KLONOPIN) 1 MG tablet Take 1 tablet (1 mg total) by mouth at bedtime. 90 tablet 0   ezetimibe (ZETIA) 10 MG tablet Take 1 tablet (10 mg total) by mouth daily. 90 tablet 3   gabapentin (NEURONTIN) 300 MG capsule Take 1 capsule (300 mg total) by mouth in the morning AND 2 capsules (600 mg total) every evening. (Patient taking differently: Take 1 capsule (300 mg total) at night) 90 capsule 5   rOPINIRole (REQUIP) 3 MG tablet Take 1 tablet (3 mg total) by mouth at bedtime. 90 tablet 1   Vibegron (GEMTESA) 75 MG TABS Take 1 tablet (75 mg total) by mouth daily. 90 tablet 3   No facility-administered medications prior to visit.    PAST MEDICAL HISTORY: 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HCC)    Hyperlipidemia    Internal hemorrhoids    Mild carpal tunnel syndrome of right wrist    Neurogenic bladder    RLS (restless legs syndrome)     PAST SURGICAL HISTORY: Past Surgical History:  Procedure Laterality Date   APPENDECTOMY     BREAST SURGERY Bilateral    cysts   COLONOSCOPY     HEMORRHOID BANDING     KNEE ARTHROSCOPY     right   SHOULDER SURGERY     Rotator cuff, left   ULNAR TUNNEL RELEASE Left 09/10/2014   Procedure: LEFT CUBITAL TUNNEL RELEASE;  Surgeon: Marianna Payment, MD;  Location: [facility];  Service: Orthopedics;  Laterality: Left;     FAMILY HISTORY: Family History  Problem Relation Age of Onset   Cancer Mother        breast cancer   Other Mother    Hyperlipidemia Mother    Stroke Father        Intracranial hemorrhage   Other Brother    Other Brother    Other Brother    Colon cancer Neg Hx    Colon polyps Neg Hx    Esophageal cancer Neg Hx    Stomach cancer Neg Hx    Rectal cancer Neg Hx     SOCIAL HISTORY: Social History   Socioeconomic History   Marital status: Married    Spouse name: Daisy   Number of children: 1   Years of education: 77  Highest education level: Not on file  Occupational History   Occupation: Unemployed    Comment: Disability  Tobacco Use   Smoking status: Never   Smokeless tobacco: Never  Vaping Use   Vaping Use: Never used  Substance and Sexual Activity   Alcohol use: Yes    Alcohol/week: 7.0 standard drinks of alcohol    Types: 7 Cans of beer per week    Comment: Consumes alcohol on occasion   Drug use: No   Sexual activity: Yes    Birth control/protection: Other-see comments    Comment: vasectomy  Other Topics Concern   Not on file  Social History Narrative   Lives with wife Investment banker, corporate) and son   Disabled   Caffeine use: Drinks coffee sometimes in the am   Right handed   occ EtOH   Non-smoker (never)   No drugs   Social Determinants of Radio broadcast assistant Strain: Not on file  Food Insecurity: Not on file  Transportation Needs: Not on file  Physical Activity: Not on file  Stress: Not on file  Social Connections: Not on file  Intimate Partner Violence: Not on file     PHYSICAL EXAM  GENERAL EXAM/CONSTITUTIONAL: Vitals:  Vitals:   10/16/22 1329  BP: (!) 151/94  Pulse: 83  Weight: 170 lb (77.1 kg)  Height: '5\' 7"'$  (1.702 m)   Body mass index is 26.63 kg/m. Wt Readings from Last 3 Encounters:  10/16/22 170 lb (77.1 kg)  06/06/22 164 lb (74.4 kg)  01/17/22 161 lb (73 kg)   Patient is in no distress; well developed, nourished and groomed;  neck is supple  CARDIOVASCULAR: Examination of carotid arteries is normal; no carotid bruits Regular rate and rhythm, no murmurs Examination of peripheral vascular system by observation and palpation is normal  EYES: Ophthalmoscopic exam of optic discs and posterior segments is normal; no papilledema or hemorrhages No results found.  MUSCULOSKELETAL: Gait, strength, tone, movements noted in Neurologic exam below  NEUROLOGIC: MENTAL STATUS:      No data to display         awake, alert, oriented to person, place and time recent and remote memory intact normal attention and concentration language fluent, comprehension intact, naming intact fund of knowledge appropriate  CRANIAL NERVE:  2nd - no papilledema on fundoscopic exam 2nd, 3rd, 4th, 6th - pupils equal and reactive to light, visual fields full to confrontation, extraocular muscles intact, no nystagmus 5th - facial sensation symmetric 7th - facial strength symmetric 8th - hearing intact 9th - palate elevates symmetrically, uvula midline 11th - shoulder shrug symmetric 12th - tongue protrusion midline  MOTOR:  normal bulk and tone, full strength in the BUE BLE INCREASED TONE / SPASTICITY; 3/5  SENSORY:  normal and symmetric to light touch  COORDINATION:  finger-nose-finger, fine finger movements normal  REFLEXES:  deep tendon reflexes BUE 1+; BLE 3+ and symmetric; NO CLONUS AT ANKLES  GAIT/STATION:  SPASTIC GAIT     DIAGNOSTIC DATA (LABS, IMAGING, TESTING) - I reviewed patient records, labs, notes, testing and imaging myself where available.  Lab Results  Component Value Date   WBC 6.5 12/06/2020   HGB 16.6 12/06/2020   HCT 48.8 12/06/2020   MCV 89 12/06/2020   PLT 244 12/06/2020      Component Value Date/Time   NA 142 12/06/2020 1351   K 4.3 12/06/2020 1351   CL 103 12/06/2020 1351   CO2 21 12/06/2020 1351   GLUCOSE 88 12/06/2020 1351  GLUCOSE 85 05/16/2016 1027   BUN 24 12/06/2020 1351    CREATININE 1.10 12/06/2020 1351   CREATININE 1.13 05/16/2016 1027   CALCIUM 9.4 12/06/2020 1351   PROT 6.8 12/06/2020 1351   ALBUMIN 4.6 12/06/2020 1351   AST 21 12/06/2020 1351   ALT 24 12/06/2020 1351   ALKPHOS 105 12/06/2020 1351   BILITOT 0.6 12/06/2020 1351   GFRNONAA 90 01/19/2019 1012   GFRNONAA 71 05/16/2016 1027   GFRAA 104 01/19/2019 1012   GFRAA 82 05/16/2016 1027   Lab Results  Component Value Date   CHOL 189 05/30/2022   HDL 42 05/30/2022   LDLCALC 132 (H) 05/30/2022   TRIG 79 05/30/2022   CHOLHDL 4.5 05/30/2022   Lab Results  Component Value Date   HGBA1C 5.1 12/06/2020   Lab Results  Component Value Date   VITAMINB12 1,130 06/11/2017   Lab Results  Component Value Date   TSH 5.840 (H) 11/22/2021      ASSESSMENT AND PLAN  67 y.o. year old male here with:  Dx:  1. 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 For refills.    Penni Bombard, MD [id number], [id number] PM Certified in Neurology, Neurophysiology and Neuroimaging  [facility] [address] [place], [place] [address] [phone]

## 2022-10-17 ENCOUNTER — Encounter: Payer: Self-pay | Admitting: Neurology

## 2022-10-18 ENCOUNTER — Other Ambulatory Visit: Payer: Self-pay

## 2022-10-18 ENCOUNTER — Other Ambulatory Visit (HOSPITAL_COMMUNITY): Payer: Self-pay

## 2022-10-18 MED ORDER — ROPINIROLE HCL 3 MG PO TABS
3.0000 mg | ORAL_TABLET | Freq: Every day | ORAL | 1 refills | Status: DC
  Filled 2022-10-18: qty 90, 90d supply, fill #0
  Filled 2023-01-13: qty 90, 90d supply, fill #1

## 2022-11-04 ENCOUNTER — Other Ambulatory Visit: Payer: Self-pay

## 2022-11-05 ENCOUNTER — Other Ambulatory Visit: Payer: Self-pay

## 2022-11-07 ENCOUNTER — Telehealth: Payer: Self-pay | Admitting: *Deleted

## 2022-11-07 ENCOUNTER — Other Ambulatory Visit (HOSPITAL_COMMUNITY): Payer: Self-pay

## 2022-11-07 DIAGNOSIS — B009 Herpesviral infection, unspecified: Secondary | ICD-10-CM

## 2022-11-07 MED ORDER — ACYCLOVIR 800 MG PO TABS
800.0000 mg | ORAL_TABLET | Freq: Every day | ORAL | 0 refills | Status: DC
  Filled 2022-11-07: qty 30, 30d supply, fill #0

## 2022-11-07 NOTE — Telephone Encounter (Signed)
Pt wife calling to schedule a physical and is requesting a refill to last until then on below medication.          acyclovir (ZOVIRAX) 800 MG tablet       [facility]  [address], [place] [address]

## 2022-11-07 NOTE — Telephone Encounter (Signed)
Refill sent.

## 2022-11-15 ENCOUNTER — Encounter: Payer: 59 | Admitting: Family Medicine

## 2022-11-27 ENCOUNTER — Other Ambulatory Visit: Payer: 59

## 2022-11-27 DIAGNOSIS — E785 Hyperlipidemia, unspecified: Secondary | ICD-10-CM

## 2022-11-28 LAB — LIPID PANEL
Chol/HDL Ratio: 3.2 ratio (ref 0.0–5.0)
Cholesterol, Total: 144 mg/dL (ref 100–199)
HDL: 45 mg/dL (ref >39)
LDL Chol Calc (NIH): 85 mg/dL (ref 0–99)
Triglycerides: 68 mg/dL (ref 0–149)
VLDL Cholesterol Cal: 14 mg/dL (ref 5–40)

## 2022-12-03 ENCOUNTER — Encounter: Payer: Self-pay | Admitting: Family Medicine

## 2022-12-03 ENCOUNTER — Ambulatory Visit (INDEPENDENT_AMBULATORY_CARE_PROVIDER_SITE_OTHER): Payer: 59 | Admitting: Family Medicine

## 2022-12-03 VITALS — BP 133/82 | HR 89 | Resp 18 | Ht 67.0 in | Wt 167.0 lb

## 2022-12-03 DIAGNOSIS — E785 Hyperlipidemia, unspecified: Secondary | ICD-10-CM | POA: Diagnosis not present

## 2022-12-03 DIAGNOSIS — G2581 Restless legs syndrome: Secondary | ICD-10-CM | POA: Diagnosis not present

## 2022-12-03 DIAGNOSIS — Z6826 Body mass index (BMI) 26.0-26.9, adult: Secondary | ICD-10-CM | POA: Diagnosis not present

## 2022-12-03 DIAGNOSIS — G114 Hereditary spastic paraplegia: Secondary | ICD-10-CM | POA: Diagnosis not present

## 2022-12-03 DIAGNOSIS — Z0000 Encounter for general adult medical examination without abnormal findings: Secondary | ICD-10-CM | POA: Diagnosis not present

## 2022-12-03 DIAGNOSIS — F32A Depression, unspecified: Secondary | ICD-10-CM

## 2022-12-03 DIAGNOSIS — E663 Overweight: Secondary | ICD-10-CM | POA: Diagnosis not present

## 2022-12-03 DIAGNOSIS — H903 Sensorineural hearing loss, bilateral: Secondary | ICD-10-CM | POA: Insufficient documentation

## 2022-12-03 DIAGNOSIS — E559 Vitamin D deficiency, unspecified: Secondary | ICD-10-CM

## 2022-12-03 NOTE — Assessment & Plan Note (Signed)
Last lipid panel: LDL 85, HDL 45, triglycerides 68.  Continue Zetia 10 mg daily.  Will continue to monitor.

## 2022-12-03 NOTE — Assessment & Plan Note (Signed)
Encouraged balanced diet and maintaining physical activity routine as he has been.  Will continue to monitor.

## 2022-12-03 NOTE — Progress Notes (Signed)
Complete physical exam  Patient: Terry Howard   DOB: 10/10/1955   67 y.o. Male  MRN: [id number]  Subjective:    Chief Complaint  Patient presents with   Annual Exam    Terry Howard is a 67 y.o. male who presents today for a complete physical exam.  He participates in upper body workouts to keep himself strong enough to pull himself up after he falls.  He generally feels fairly well. He reports sleeping poorly due to uncontrolled restless leg syndrome.  He was previously taking gabapentin and baclofen but discontinued both. He does not have additional problems to discuss today.    Most recent fall risk assessment:    12/03/2022    3:06 PM  Fall Risk   Falls in the past year? 1  Number falls in past yr: 1  Injury with Fall? 0  Risk for fall due to : Impaired mobility  Follow up Falls evaluation completed     Most recent depression and anxiety screenings:    01/17/2022    1:31 PM 06/14/2021   11:20 AM  PHQ 2/9 Scores  PHQ - 2 Score 0 0  PHQ- 9 Score 3 1      01/17/2022    1:32 PM 06/14/2021   11:21 AM 02/21/2021   10:04 AM 12/25/2018    1:27 PM  GAD 7 : Generalized Anxiety Score  Nervous, Anxious, on Edge 0 0 0 2  Control/stop worrying 0 0 0 2  Worry too much - different things 0 0 0 1  Trouble relaxing 0 0 0 1  Restless 0 0 1 1  Easily annoyed or irritable 0 0 0 2  Afraid - awful might happen 0 0 0 2  Total GAD 7 Score 0 0 1 11  Anxiety Difficulty Not difficult at all Somewhat difficult  Very difficult    Patient Active Problem List   Diagnosis Date Noted   Sensorineural hearing loss, bilateral 12/03/2022   Overweight with body mass index (BMI) of 26 to 26.9 in adult 12/03/2022   Anal skin tags 03/10/2019   Cubital tunnel syndrome on left 11/06/2018   Gum hypertrophy 06/06/2018   Disability of walking 06/06/2018   Disability due to neurological disorder 06/06/2018   Habitual snoring 11/26/2017   Chronic pansinusitis 09/25/2017   Deviated septum  09/05/2017   Hypertrophy, nasal, turbinate 09/05/2017   Perennial allergic rhinitis 09/05/2017   Impingement syndrome of left shoulder 04/29/2017   Chronic left shoulder pain 04/22/2017   Statin intolerance 12/13/2016   Vitamin D insufficiency 06/21/2016   Elevated TSH 06/21/2016   Benign prostatic hyperplasia  L>R; without lower urinary tract symptoms 05/18/2016   Internal and external prolapsed hemorrhoids 05/18/2016   Restless legs syndrome (RLS) 02/15/2016   Erectile dysfunction 02/15/2016   h/o CLinically Signficant Anxiety 01/05/2014   Depressive disorder 12/31/2013   Hyperlipemia 12/18/2013   Genital Herpes 11/18/2013   Insomnia 11/18/2013   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   Chronic pain 08/11/2013   Abnormality of gait 07/09/2012   Lumbago 07/09/2012   Disturbance of skin sensation 07/09/2012   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    Past Surgical History:  Procedure Laterality Date   APPENDECTOMY     BREAST SURGERY Bilateral    cysts   COLONOSCOPY     HEMORRHOID BANDING     KNEE ARTHROSCOPY     right   SHOULDER SURGERY     Rotator cuff, left   ULNAR TUNNEL RELEASE  Left 09/10/2014   Procedure: LEFT CUBITAL TUNNEL RELEASE;  Surgeon: [name], MD;  Location: [facility];  Service: Orthopedics;  Laterality: Left;   Social History   Tobacco Use   Smoking status: Never    Passive exposure: Never   Smokeless tobacco: Never  Vaping Use   Vaping Use: Never used  Substance Use Topics   Alcohol use: Yes    Alcohol/week: 7.0 standard drinks of alcohol    Types: 7 Cans of beer per week    Comment: Consumes alcohol on occasion   Drug use: No   Family History  Problem Relation Age of Onset   Cancer Mother        breast cancer   Other Mother    Hyperlipidemia Mother    Stroke Father        Intracranial hemorrhage   Other Brother    Other Brother    Other Brother    Colon cancer Neg Hx    Colon polyps Neg Hx     Esophageal cancer Neg Hx    Stomach cancer Neg Hx    Rectal cancer Neg Hx    Allergies  Allergen Reactions   Prozac [Fluoxetine Hcl]     tolerate well due to SE -felt sluggish, tired, and anxious    Pregabalin Other (See Comments)    Intolerance, somnolence   Statins Other (See Comments)    Other reaction(s): Muscle Pain  Other Reaction(s): Other (See Comments)  Other reaction(s): Muscle Pain, Other reaction(s): Muscle Pain   Neupro [Rotigotine] Rash     Patient Care Team: Melida Quitter, PA as PCP - General (Family Medicine) Tyrell Antonio, MD as Consulting Physician (Physical Medicine and Rehabilitation) York Spaniel, MD (Inactive) as Consulting Physician (Neurology) Specialists, [facility] (Orthopedic Surgery) Suzanna Obey, MD as Consulting Physician (Otolaryngology) Iva Boop, MD as Consulting Physician (Gastroenterology) Drema Halon, MD (Inactive) as Consulting Physician (Otolaryngology) Ihor Gully, MD (Inactive) as Consulting Physician (Urology)   Outpatient Medications Prior to Visit  Medication Sig   acyclovir (ZOVIRAX) 800 MG tablet Take 1 tablet (800 mg total) by mouth daily.   baclofen (LIORESAL) 10 MG tablet Take 1 tablet (10 mg total) by mouth 3 (three) times daily.   Bempedoic Acid (NEXLETOL) 180 MG TABS Take 1 tablet by mouth daily.   clonazePAM (KLONOPIN) 1 MG tablet Take 1 tablet (1 mg total) by mouth at bedtime.   ezetimibe (ZETIA) 10 MG tablet Take 1 tablet (10 mg total) by mouth daily.   gabapentin (NEURONTIN) 300 MG capsule Take 1 capsule (300 mg total) by mouth in the morning AND 2 capsules (600 mg total) every evening. (Patient taking differently: Take 1 capsule (300 mg total) at night)   rOPINIRole (REQUIP) 3 MG tablet Take 1 tablet (3 mg total) by mouth at bedtime.   No facility-administered medications prior to visit.    Review of Systems  Constitutional:  Negative for chills, fever and malaise/fatigue.   HENT:  Negative for congestion and hearing loss.   Eyes:  Negative for blurred vision and double vision.  Respiratory:  Negative for cough and shortness of breath.   Cardiovascular:  Negative for chest pain, palpitations and leg swelling.  Gastrointestinal:  Negative for abdominal pain, constipation, diarrhea and heartburn.  Genitourinary:  Negative for frequency and urgency.  Musculoskeletal:  Negative for myalgias and neck pain.  Neurological:  Negative for headaches.  Endo/Heme/Allergies:  Negative for polydipsia.  Psychiatric/Behavioral:  Negative for depression.  The patient is not nervous/anxious.      Objective:    BP 133/82 (BP Location: Left Arm, Patient Position: Sitting, Cuff Size: Normal)   Pulse 89   Resp 18   Ht 5\' 7"  (1.702 m)   Wt 167 lb (75.8 kg)   SpO2 98%   BMI 26.16 kg/m    Physical Exam Constitutional:      General: He is not in acute distress.    Appearance: Normal appearance.  HENT:     Head: Normocephalic and atraumatic.     Right Ear: Tympanic membrane, ear canal and external ear normal.     Left Ear: Tympanic membrane, ear canal and external ear normal.     Nose: Nose normal.     Mouth/Throat:     Mouth: Mucous membranes are moist.     Pharynx: Oropharynx is clear.  Eyes:     Extraocular Movements: Extraocular movements intact.     Pupils: Pupils are equal, round, and reactive to light.     Comments: Does not wear glasses or contacts except for reading glasses  Cardiovascular:     Rate and Rhythm: Normal rate and regular rhythm.     Pulses: Normal pulses.     Heart sounds: Normal heart sounds.  Pulmonary:     Effort: Pulmonary effort is normal.     Breath sounds: Normal breath sounds.  Abdominal:     General: Bowel sounds are normal.     Palpations: Abdomen is soft.  Musculoskeletal:        General: Normal range of motion.     Cervical back: Normal range of motion and neck supple.     Comments: 5/5 strength upper and lower extremities   Skin:    General: Skin is warm and dry.  Neurological:     Mental Status: He is alert and oriented to person, place, and time.     Cranial Nerves: No cranial nerve deficit.     Gait: Gait abnormal (Spastic, uses cane).     Deep Tendon Reflexes: Reflexes normal.  Psychiatric:        Mood and Affect: Mood normal.       Assessment & Plan:    Routine Health Maintenance and Physical Exam  Immunization History  Administered Date(s) Administered   Fluad Quad(high Dose 65+) 06/06/2022   Influenza,inj,Quad PF,6+ Mos 08/21/2018, 06/15/2019   Influenza-Unspecified 06/14/2020   PFIZER(Purple Top)SARS-COV-2 Vaccination 10/01/2019, 10/29/2019, 11/19/2019, 06/03/2020   Zoster Recombinat (Shingrix) 06/06/2022    Health Maintenance  Topic Date Due   DTaP/Tdap/Td (1 - Tdap) Never done   Pneumonia Vaccine 62+ Years old (1 of 1 - PCV) Never done   COVID-19 Vaccine (5 - 2023-24 season) 04/06/2022   Zoster Vaccines- Shingrix (2 of 2) 08/01/2022   INFLUENZA VACCINE  03/07/2023   COLONOSCOPY (Pts 45-61yrs Insurance coverage will need to be confirmed)  08/27/2027   Hepatitis C Screening  Completed   HPV VACCINES  Aged Out   Patient is due for pneumonia, Tdap, and second shingles shot.  Included information in his AVS and he plans on going to his local pharmacy to have them updated.  Discussed health benefits of physical activity, and encouraged him to engage in regular exercise appropriate for his age and condition.  Wellness examination -     CBC with Differential/Platelet; Future -     Comprehensive metabolic panel; Future -     Hemoglobin A1c; Future -     VITAMIN D 25 Hydroxy (Vit-D Deficiency,  Fractures); Future -     TSH; Future -     T4, free; Future  Depressive disorder Assessment & Plan: PHQ-9 score of 3, GAD-7 score of 3.  Patient states that his mood is stable and he does not need any changes in medication at this time.  Will continue to monitor.  Orders: -     CBC with  Differential/Platelet; Future -     Comprehensive metabolic panel; Future  Hyperlipidemia, unspecified hyperlipidemia type Assessment & Plan: Last lipid panel: LDL 85, HDL 45, triglycerides 68.  Continue Zetia 10 mg daily.  Will continue to monitor.   Vitamin D insufficiency -     VITAMIN D 25 Hydroxy (Vit-D Deficiency, Fractures); Future  Overweight with body mass index (BMI) of 26 to 26.9 in adult Assessment & Plan: Encouraged balanced diet and maintaining physical activity routine as he has been.  Will continue to monitor.  Orders: -     CBC with Differential/Platelet; Future -     Comprehensive metabolic panel; Future -     Hemoglobin A1c; Future -     VITAMIN D 25 Hydroxy (Vit-D Deficiency, Fractures); Future -     TSH; Future -     T4, free; Future  Restless legs syndrome (RLS) Assessment & Plan: Followed by neurology.  Patient discontinued gabapentin and baclofen, has continued ropinirole 3 mg nightly.  First-line treatment options include pregabalin and gabapentin, other treatment options include dopamine agonists like ropinirole.  Recommend continuing follow-up with neurology, in the future he may consider pain management clinic.  Will continue to monitor.   Hereditary spastic paraparesis (HCC) Assessment & Plan: Followed by neurology, last visit about a month ago.  Discontinued baclofen and gabapentin.  He is still taking ropinirole 3 mg daily.   Unsure why full panel of labs was not drawn when he was in the office, repeating CBC, CMP, A1c, vitamin D, thyroid function labs.  Only lipid panel was drawn.  Return in about 6 months (around 06/04/2023) for follow-up for HLD, mood, fasting blood work.     Melida Quitter, PA

## 2022-12-03 NOTE — Assessment & Plan Note (Signed)
Followed by neurology.  Patient discontinued gabapentin and baclofen, has continued ropinirole 3 mg nightly.  First-line treatment options include pregabalin and gabapentin, other treatment options include dopamine agonists like ropinirole.  Recommend continuing follow-up with neurology, in the future he may consider pain management clinic.  Will continue to monitor.

## 2022-12-03 NOTE — Patient Instructions (Signed)
When you go to the pharmacy, let them know that you need the following vaccines: - Tetanus booster (Tdap) - Shingles 2nd dose - PCV20 pneumonia

## 2022-12-03 NOTE — Assessment & Plan Note (Addendum)
PHQ-9 score of 3, GAD-7 score of 3.  Patient states that his mood is stable and he does not need any changes in medication at this time.  Will continue to monitor.

## 2022-12-03 NOTE — Assessment & Plan Note (Signed)
Followed by neurology, last visit about a month ago.  Discontinued baclofen and gabapentin.  He is still taking ropinirole 3 mg daily.

## 2022-12-04 LAB — COMPREHENSIVE METABOLIC PANEL
ALT: 20 IU/L (ref 0–44)
AST: 22 IU/L (ref 0–40)
Albumin/Globulin Ratio: 2 (ref 1.2–2.2)
Albumin: 4.4 g/dL (ref 3.9–4.9)
Alkaline Phosphatase: 84 IU/L (ref 44–121)
BUN/Creatinine Ratio: 21 (ref 10–24)
BUN: 22 mg/dL (ref 8–27)
Bilirubin Total: 0.4 mg/dL (ref 0.0–1.2)
CO2: 18 mmol/L — ABNORMAL LOW (ref 20–29)
Calcium: 9.3 mg/dL (ref 8.6–10.2)
Chloride: 107 mmol/L — ABNORMAL HIGH (ref 96–106)
Creatinine, Ser: 1.03 mg/dL (ref 0.76–1.27)
Globulin, Total: 2.2 g/dL (ref 1.5–4.5)
Glucose: 113 mg/dL — ABNORMAL HIGH (ref 70–99)
Potassium: 3.8 mmol/L (ref 3.5–5.2)
Sodium: 141 mmol/L (ref 134–144)
Total Protein: 6.6 g/dL (ref 6.0–8.5)
eGFR: 80 mL/min/{1.73_m2} (ref >59)

## 2022-12-04 LAB — TSH: TSH: 4.94 u[IU]/mL — ABNORMAL HIGH (ref 0.450–4.500)

## 2022-12-04 LAB — CBC WITH DIFFERENTIAL/PLATELET
Basophils Absolute: 0 10*3/uL (ref 0.0–0.2)
Basos: 0 %
EOS (ABSOLUTE): 0.2 10*3/uL (ref 0.0–0.4)
Eos: 3 %
Hematocrit: 46.7 % (ref 37.5–51.0)
Hemoglobin: 16.2 g/dL (ref 13.0–17.7)
Immature Grans (Abs): 0 10*3/uL (ref 0.0–0.1)
Immature Granulocytes: 0 %
Lymphocytes Absolute: 2.1 10*3/uL (ref 0.7–3.1)
Lymphs: 31 %
MCH: 32 pg (ref 26.6–33.0)
MCHC: 34.7 g/dL (ref 31.5–35.7)
MCV: 92 fL (ref 79–97)
Monocytes Absolute: 0.6 10*3/uL (ref 0.1–0.9)
Monocytes: 10 %
Neutrophils Absolute: 3.8 10*3/uL (ref 1.4–7.0)
Neutrophils: 56 %
Platelets: 252 10*3/uL (ref 150–450)
RBC: 5.07 x10E6/uL (ref 4.14–5.80)
RDW: 13.2 % (ref 11.6–15.4)
WBC: 6.8 10*3/uL (ref 3.4–10.8)

## 2022-12-04 LAB — HEMOGLOBIN A1C
Est. average glucose Bld gHb Est-mCnc: 103 mg/dL
Hgb A1c MFr Bld: 5.2 % (ref 4.8–5.6)

## 2022-12-04 LAB — T4, FREE: Free T4: 1.33 ng/dL (ref 0.82–1.77)

## 2022-12-04 LAB — VITAMIN D 25 HYDROXY (VIT D DEFICIENCY, FRACTURES): Vit D, 25-Hydroxy: 32.1 ng/mL (ref 30.0–100.0)

## 2022-12-08 ENCOUNTER — Other Ambulatory Visit: Payer: Self-pay | Admitting: Neurology

## 2022-12-08 DIAGNOSIS — F339 Major depressive disorder, recurrent, unspecified: Secondary | ICD-10-CM

## 2022-12-08 DIAGNOSIS — G4700 Insomnia, unspecified: Secondary | ICD-10-CM

## 2022-12-10 ENCOUNTER — Other Ambulatory Visit (HOSPITAL_COMMUNITY): Payer: Self-pay

## 2022-12-10 ENCOUNTER — Other Ambulatory Visit: Payer: Self-pay

## 2022-12-10 MED ORDER — CLONAZEPAM 1 MG PO TABS
1.0000 mg | ORAL_TABLET | Freq: Every day | ORAL | 0 refills | Status: DC
Start: 2022-12-10 — End: 2023-03-14
  Filled 2022-12-10: qty 90, 90d supply, fill #0

## 2022-12-10 NOTE — Telephone Encounter (Signed)
Requested Prescriptions   Pending Prescriptions Disp Refills   clonazePAM (KLONOPIN) 1 MG tablet 90 tablet 0    Sig: Take 1 tablet (1 mg total) by mouth at bedtime.   Last seen 10/16/22 by penumalli, next appt 10/16/23. Routing to dr. Marjory Lies  Dispenses   Dispensed Days Supply Quantity Provider Pharmacy  clonazePAM Scarlette Calico) 1 MG tablet 09/12/2022 90 90 tablet Glean Salvo, NP [place] [facility]  clonazePAM (KLONOPIN) 1 MG tablet 06/13/2022 90 90 tablet Glean Salvo, NP [place] [facility]  clonazePAM (KLONOPIN) 1 MG tablet 03/14/2022 90 90 tablet Glean Salvo, NP [place] [facility]Marland Kitchen

## 2022-12-22 ENCOUNTER — Other Ambulatory Visit: Payer: Self-pay | Admitting: Family Medicine

## 2022-12-22 DIAGNOSIS — B009 Herpesviral infection, unspecified: Secondary | ICD-10-CM

## 2022-12-24 ENCOUNTER — Other Ambulatory Visit (HOSPITAL_COMMUNITY): Payer: Self-pay

## 2022-12-24 ENCOUNTER — Other Ambulatory Visit: Payer: Self-pay

## 2022-12-24 MED ORDER — ACYCLOVIR 800 MG PO TABS
800.0000 mg | ORAL_TABLET | Freq: Every day | ORAL | 3 refills | Status: AC
Start: 2022-12-24 — End: ?
  Filled 2022-12-24: qty 90, 90d supply, fill #0
  Filled 2023-05-12: qty 90, 90d supply, fill #1

## 2023-01-06 ENCOUNTER — Other Ambulatory Visit: Payer: Self-pay | Admitting: Internal Medicine

## 2023-01-07 ENCOUNTER — Other Ambulatory Visit: Payer: Self-pay

## 2023-01-07 ENCOUNTER — Other Ambulatory Visit (HOSPITAL_COMMUNITY): Payer: Self-pay

## 2023-01-07 MED ORDER — EZETIMIBE 10 MG PO TABS
10.0000 mg | ORAL_TABLET | Freq: Every day | ORAL | 4 refills | Status: DC
  Filled 2023-01-07: qty 30, 30d supply, fill #0
  Filled 2023-02-10: qty 30, 30d supply, fill #1
  Filled 2023-03-14: qty 30, 30d supply, fill #2
  Filled 2023-04-14: qty 30, 30d supply, fill #3
  Filled 2023-05-12: qty 30, 30d supply, fill #4

## 2023-01-11 ENCOUNTER — Other Ambulatory Visit (HOSPITAL_COMMUNITY): Payer: Self-pay

## 2023-02-11 ENCOUNTER — Telehealth: Payer: Self-pay

## 2023-02-11 ENCOUNTER — Telehealth: Payer: Self-pay | Admitting: Diagnostic Neuroimaging

## 2023-02-11 NOTE — Telephone Encounter (Signed)
Patient is interested in seeing if there are any injections possible for restless legs, says he has injections before but not in the legs. He was wanting to know if botox is an option for him or if he can get an appointment to discuss this as he thinks it will help his muscle spasms. I advised I would send to provider about next steps

## 2023-02-11 NOTE — Telephone Encounter (Signed)
Pt asking if Dr. Marjory Lies does injections for restless leg. Would like a call back.

## 2023-02-11 NOTE — Telephone Encounter (Signed)
Patient called stating that he is having restless leg and would like to know if there is a shot that would help with this.  CB# [id number].  Please advise.  Thank you.

## 2023-02-13 NOTE — Telephone Encounter (Signed)
I would recommend PM&R consult to consider other treatment options for spasticity (such as botox).  Suanne Marker, MD 02/13/2023, 6:23 PM Certified in Neurology, Neurophysiology and Neuroimaging  [facility] [address] [place], [place] [address] [phone]

## 2023-02-13 NOTE — Telephone Encounter (Signed)
Left detailed voicemail informing patient of information below per Dr. Alvester Morin

## 2023-02-14 ENCOUNTER — Encounter: Payer: Self-pay | Admitting: Anesthesiology

## 2023-03-14 ENCOUNTER — Other Ambulatory Visit: Payer: Self-pay

## 2023-03-14 ENCOUNTER — Other Ambulatory Visit: Payer: Self-pay | Admitting: Diagnostic Neuroimaging

## 2023-03-14 ENCOUNTER — Other Ambulatory Visit (HOSPITAL_COMMUNITY): Payer: Self-pay

## 2023-03-14 DIAGNOSIS — F339 Major depressive disorder, recurrent, unspecified: Secondary | ICD-10-CM

## 2023-03-14 DIAGNOSIS — G4700 Insomnia, unspecified: Secondary | ICD-10-CM

## 2023-03-14 MED ORDER — CLONAZEPAM 1 MG PO TABS
1.0000 mg | ORAL_TABLET | Freq: Every day | ORAL | 0 refills | Status: AC
Start: 2023-03-14 — End: ?
  Filled 2023-03-14: qty 90, 90d supply, fill #0

## 2023-04-14 ENCOUNTER — Other Ambulatory Visit: Payer: Self-pay | Admitting: Neurology

## 2023-04-15 ENCOUNTER — Other Ambulatory Visit: Payer: Self-pay

## 2023-04-15 MED ORDER — ROPINIROLE HCL 3 MG PO TABS
3.0000 mg | ORAL_TABLET | Freq: Every day | ORAL | 1 refills | Status: AC
  Filled 2023-04-15: qty 90, 90d supply, fill #0

## 2023-04-16 ENCOUNTER — Other Ambulatory Visit (HOSPITAL_COMMUNITY): Payer: Self-pay

## 2023-05-18 ENCOUNTER — Telehealth: Payer: 59 | Admitting: Family Medicine

## 2023-05-18 DIAGNOSIS — J208 Acute bronchitis due to other specified organisms: Secondary | ICD-10-CM | POA: Diagnosis not present

## 2023-05-18 DIAGNOSIS — B9689 Other specified bacterial agents as the cause of diseases classified elsewhere: Secondary | ICD-10-CM

## 2023-05-18 MED ORDER — DOXYCYCLINE HYCLATE 100 MG PO TABS
100.0000 mg | ORAL_TABLET | Freq: Two times a day (BID) | ORAL | 0 refills | Status: AC
Start: 2023-05-18 — End: 2023-05-25

## 2023-05-18 MED ORDER — BENZONATATE 100 MG PO CAPS
100.0000 mg | ORAL_CAPSULE | Freq: Three times a day (TID) | ORAL | 0 refills | Status: AC
Start: 2023-05-18 — End: 2023-05-25

## 2023-05-18 NOTE — Progress Notes (Signed)

## 2023-05-22 ENCOUNTER — Encounter: Payer: Self-pay | Admitting: Neurology

## 2023-05-22 ENCOUNTER — Encounter: Payer: Self-pay | Admitting: Family Medicine

## 2023-05-22 ENCOUNTER — Encounter: Payer: Self-pay | Admitting: Internal Medicine

## 2023-05-22 NOTE — Telephone Encounter (Signed)
Per patient's request faxed last office note with medication to [facility].

## 2023-06-04 ENCOUNTER — Ambulatory Visit: Payer: 59 | Admitting: Family Medicine

## 2023-06-04 NOTE — Telephone Encounter (Signed)
   You can provide a note with above recommendations per Dr. Marjory Lies. Thanks

## 2023-06-05 ENCOUNTER — Encounter: Payer: Self-pay | Admitting: Neurology

## 2023-06-09 ENCOUNTER — Other Ambulatory Visit: Payer: Self-pay | Admitting: Internal Medicine

## 2023-06-10 ENCOUNTER — Telehealth: Payer: Self-pay | Admitting: Diagnostic Neuroimaging

## 2023-06-10 NOTE — Telephone Encounter (Signed)
[facility] ([facility] ) Requesting a letter from provider to agree to  turning off all prescriptions  to [facility] for cost effective. Can Fax to:  [phone]

## 2023-06-11 ENCOUNTER — Other Ambulatory Visit (HOSPITAL_COMMUNITY): Payer: Self-pay

## 2023-06-12 NOTE — Telephone Encounter (Signed)
Letter in communications. Looks like they also discussed seeing PMR to discuss options for Botox injections for chronic spasticity.

## 2023-06-12 NOTE — Telephone Encounter (Signed)
Letter faxed to number provided.

## 2023-06-14 ENCOUNTER — Other Ambulatory Visit: Payer: Self-pay | Admitting: Internal Medicine

## 2023-06-14 ENCOUNTER — Other Ambulatory Visit: Payer: Self-pay

## 2023-06-14 ENCOUNTER — Other Ambulatory Visit (HOSPITAL_COMMUNITY): Payer: Self-pay

## 2023-06-14 MED ORDER — EZETIMIBE 10 MG PO TABS
10.0000 mg | ORAL_TABLET | Freq: Every day | ORAL | 0 refills | Status: AC
  Filled 2023-06-14: qty 30, 30d supply, fill #0

## 2023-06-21 ENCOUNTER — Other Ambulatory Visit (HOSPITAL_COMMUNITY): Payer: Self-pay

## 2023-06-21 ENCOUNTER — Telehealth: Payer: Self-pay | Admitting: Pharmacy Technician

## 2023-06-21 NOTE — Telephone Encounter (Signed)
Pharmacy Patient Advocate Encounter   Received notification from CoverMyMeds that prior authorization for nexletol is required/requested.   Insurance verification completed.   The patient is insured through [facility] .   Per test claim:  not needed at this time- prior auth on file until 08/31/23

## 2023-07-23 ENCOUNTER — Encounter (HOSPITAL_COMMUNITY): Payer: Self-pay

## 2023-07-23 ENCOUNTER — Other Ambulatory Visit (HOSPITAL_COMMUNITY): Payer: Self-pay

## 2023-07-23 ENCOUNTER — Other Ambulatory Visit: Payer: Self-pay | Admitting: Neurology

## 2023-07-23 DIAGNOSIS — F339 Major depressive disorder, recurrent, unspecified: Secondary | ICD-10-CM

## 2023-07-23 DIAGNOSIS — G4700 Insomnia, unspecified: Secondary | ICD-10-CM

## 2023-07-23 NOTE — Telephone Encounter (Signed)
Requested Prescriptions   Pending Prescriptions Disp Refills   clonazePAM (KLONOPIN) 1 MG tablet 90 tablet 0    Sig: Take 1 tablet (1 mg total) by mouth at bedtime.   Last seen 10/16/22 Next appt 10/16/23 Dispenses    Dispensed Days Supply Quantity Provider Pharmacy  clonazePAM (KLONOPIN) 1 MG tablet 03/18/2023 90 90 tablet Levert Feinstein, MD [place] [facility]  clonazePAM (KLONOPIN) 1 MG tablet 12/11/2022 90 90 tablet Penumalli, Glenford Bayley, MD [place] [facility]  clonazePAM (KLONOPIN) 1 MG tablet 09/12/2022 90 90 tablet Glean Salvo, NP [place] [facility]Marland Kitchen

## 2023-09-12 ENCOUNTER — Encounter: Payer: Self-pay | Admitting: Family Medicine

## 2023-10-09 ENCOUNTER — Encounter: Payer: Self-pay | Admitting: Neurology

## 2023-10-16 ENCOUNTER — Telehealth: Payer: 59 | Admitting: Neurology

## 2024-06-09 ENCOUNTER — Encounter: Payer: Self-pay | Admitting: Internal Medicine

## 2024-07-07 ENCOUNTER — Ambulatory Visit
Admission: RE | Admit: 2024-07-07 | Discharge: 2024-07-07 | Disposition: A | Source: Ambulatory Visit | Attending: Internal Medicine | Admitting: Internal Medicine

## 2024-07-07 ENCOUNTER — Encounter: Payer: Self-pay | Admitting: Internal Medicine

## 2024-07-07 ENCOUNTER — Ambulatory Visit (INDEPENDENT_AMBULATORY_CARE_PROVIDER_SITE_OTHER): Admitting: Internal Medicine

## 2024-07-07 VITALS — BP 140/90 | HR 98

## 2024-07-07 DIAGNOSIS — G114 Hereditary spastic paraplegia: Secondary | ICD-10-CM

## 2024-07-07 DIAGNOSIS — K5909 Other constipation: Secondary | ICD-10-CM | POA: Diagnosis not present

## 2024-07-07 DIAGNOSIS — K5900 Constipation, unspecified: Secondary | ICD-10-CM

## 2024-07-07 DIAGNOSIS — N319 Neuromuscular dysfunction of bladder, unspecified: Secondary | ICD-10-CM

## 2024-07-07 DIAGNOSIS — K592 Neurogenic bowel, not elsewhere classified: Secondary | ICD-10-CM

## 2024-07-07 NOTE — Patient Instructions (Signed)
 VISIT SUMMARY: Today, we discussed your ongoing issues with constipation and bowel management, which are likely related to your hereditary spastic paraplegia. We have made some changes to your treatment plan to help manage these symptoms.  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 -Start taking Miralax daily and Dulcolax once a week. -We have ordered an abdominal x-ray to check for fecal load. -You have been referred to physical medicine and rehabilitation. -We will have a follow-up appointment in January to see how the new regimen is working.  Ask the [facility] doctors for physical therapy help with bowel problems. Ask if you can see a physical medicine doctor (physiatrist)    Managing Bowel Problems in Hereditary Spastic Paraparesis  Understanding Bowel Problems in HSP  Many people with hereditary spastic paraparesis (HSP) experience bowel problems, including constipation (difficulty having bowel movements) and fecal incontinence (difficulty controlling bowel movements). These symptoms are common and can significantly affect your quality of life.[1][2] The good news is that there are several strategies that can help manage these problems.  Daily Management Strategies  Establish a Regular Routine  - Try to have a bowel movement at the same time each day, ideally after a meal when your digestive system is most active  - Don't ignore the urge to have a bowel movement--respond to your body's signals promptly  - Avoid spending excessive time straining on the toilet  Diet and Hydration  - Drink plenty of fluids throughout the day (aim for 6-8 glasses of water daily)  - Eat meals of at least 500 calories to help stimulate bowel activity  - Include fiber in your diet through fruits, vegetables, and whole grains   Positioning  - Use a  footstool while sitting on the toilet to raise your knees above your hips--this can make bowel movements easier  Medications That May Help  Your doctor may recommend stool softeners to help manage both constipation and fecal incontinence.[3] Additional options may include:  - Osmotic laxatives (medications that draw water into the bowel to soften stools)  - Stimulant laxatives for occasional use  - Enemas or suppositories if you haven't had a bowel movement for 2 days  Always discuss medication options with your healthcare provider before starting them.  MiraLax Purge one time a week  Please do the following: Purchase a bottle of Miralax over the counter as well as a box of 5 mg dulcolax tablets. Take 4 dulcolax tablets. Wait 1 hour. You will then drink 6-8 capfuls of Miralax mixed in an adequate amount of water/juice/gatorade (you may choose which of these liquids to drink) over the next 2-3 hours. You should expect results within 1 to 6 hours after completing the bowel purge.   When Conservative Measures Aren't Enough  If diet, routine, and medications don't adequately control your symptoms, talk to your doctor about additional options. These may include specialized treatments that have shown benefit for bowel dysfunction in neurological conditions.[4][5]  Working with Your Healthcare Team  Bowel problems can be difficult to discuss, but they are an important part of your overall health. Your healthcare providers should regularly ask about these symptoms and help you manage them.[1][2] Consider working with:  - Your neurologist or primary care doctor  - A gastroenterologist (digestive system specialist) if needed  - A physical therapist who can help with positioning and mobility  Important Points to Remember  - Bowel problems are  common in HSP and are not your fault  - Many treatment options are available, starting with simple lifestyle changes  - Keep track of your  bowel patterns and symptoms to share with your doctor  - Don't hesitate to ask for help--effective management can significantly improve your quality of life  When to Contact Your Doctor  Reach out to your healthcare provider if you experience:  - Severe constipation lasting more than a few days  - Frequent fecal incontinence that interferes with daily activities  - Blood in your stool  - Severe abdominal pain  - Symptoms that don't improve with the strategies above

## 2024-07-07 NOTE — Progress Notes (Signed)
 Terry Howard 68 y.o. April 27, 1956 [id number]  Assessment & Plan:   Encounter Diagnoses  Name Primary?   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 Normal colonoscopy. . - Initiated daily Miralax and weekly MiraLAX purge with Dulcolax. Will reassess in January.  He may need enema therapy or manual stimulation.  His wife is a engineer, civil (consulting) so she could help.  - Ordered abdominal x-ray to assess fecal load.  I have reviewed the images and there is a large amount of feces, no pathologic dilation of bowel. - [facility].  Also pelvic floor physical therapy if possible.  He has difficulty making visits due to his impaired mobility, his wife works and cannot drive him all the time either.  Also not a candidate for certain treatments or services as his hereditary spastic paraparesis is not service-connected.  Hopefully he could get community care referrals. - Scheduled follow-up in January to reassess regimen efficacy. - I have requested copies of his colonoscopy  CC: Terry Howard LABOR, PA   Subjective:   Chief Complaint: Constipation  HPI Discussed the use of AI scribe software for clinical note transcription with the patient, who gave verbal consent to proceed.  Terry Howard is a 67 year old male with hereditary spastic paraplegia who presents with constipation and bowel management issues. He was referred by his primary care doctor for evaluation of constipation and bowel management issues.  Constipation and bowel dysfunction - Severe constipation for the past 2.5 months with significant change in bowel habits - No relief with Miralax as prescribed - Bloating and  increased gas, temporally associated with initiation of CPAP therapy - Occasional leakage of stool and gas, particularly with exertion (e.g., mowing lawns) - Difficulty distinguishing between gas and the need to defecate, resulting in accidents - Wears diapers due to incontinence - Colonoscopy at the [facility] showed no abnormalities, including no cancer or polyps  Neurogenic bladder and urinary symptoms - Difficulty urinating and occasional urinary leakage - Symptoms attributed to underlying neurological condition (hereditary spastic paraplegia)  Hereditary spastic paraplegia and mobility impairment - Diagnosed with hereditary spastic paraplegia, inherited from his mother - Requires a scooter for mobility; legs are nonfunctional - Brothers also affected, with two in wheelchairs - Struggles with physical tasks such as lifting weights or carrying items  Restless legs syndrome and sleep disturbance - Restless legs syndrome causing nocturnal symptoms described as 'kicking up all night' - Sleep disruption due to restless legs - Takes Requip  6 mg for restless legs syndrome - Trial of mirtazapine for sleep discontinued due to itching and weight gain      Terry Howard history includes normal colonoscopy except for hemorrhoids 2019 and banding of internal hemorrhoids in the past also.  Allergies  Allergen Reactions   Prozac  [Fluoxetine  Hcl]     tolerate well due to SE -felt sluggish, tired, and anxious    Pregabalin Other (See Comments)    Intolerance, somnolence   Statins Other (See Comments)    Other reaction(s): Muscle Pain  Other Reaction(s): Other (See Comments)  Other reaction(s): Muscle Pain, Other reaction(s): Muscle Pain   Neupro  [Rotigotine ] Rash   Current Meds  Medication Sig   acyclovir  (ZOVIRAX )  800 MG tablet Take 1 tablet (800 mg total) by mouth daily.   clonazePAM  (KLONOPIN ) 1 MG tablet Take 1 tablet (1 mg total) by mouth at bedtime.   ezetimibe  (ZETIA ) 10 MG tablet Take 1  tablet (10 mg total) by mouth daily. Please call to schedule an overdue appointment for future refills. Thank you. 1st attempt.   fenofibrate 160 MG tablet Take 160 mg by mouth.   rOPINIRole  (REQUIP ) 3 MG tablet Take 1 tablet (3 mg total) by mouth at bedtime.   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HCC)    Hyperlipidemia    Internal hemorrhoids    Mild carpal tunnel syndrome of right wrist    Neurogenic bladder    RLS (restless legs syndrome)    Past Surgical History:  Procedure Laterality Date   APPENDECTOMY     BREAST SURGERY Bilateral    cysts   COLONOSCOPY     HEMORRHOID BANDING     KNEE ARTHROSCOPY     right   SHOULDER SURGERY     Rotator cuff, left   ULNAR TUNNEL RELEASE Left 09/10/2014   Procedure: LEFT CUBITAL TUNNEL RELEASE;  Surgeon: Terry Ozell Cummins, MD;  Location: [facility];  Service: Orthopedics;  Laterality: Left;   Social History   Social History Narrative   Lives with wife BANKER) and son   Disabled   Caffeine use: Drinks coffee sometimes in the am   Right handed   occ EtOH   Non-smoker (never)   No drugs   family history includes Cancer in his mother; Hyperlipidemia in his mother; Other in his brother, brother, brother, and mother; Stroke in his father.   Review of Systems As per HPI.  Patient struggles to ambulate with decreasing function of his lower extremities.  Uses a scooter.  He is still driving but that is becoming more of a problem and he has an appointment at the [facility] to see if he can use hand controls though he would have to purchase those himself. He also has insomnia, decreased hearing myalgia fatigue.  Otherwise negative. Objective:   Physical Exam @BP  (!) 140/90   Pulse 98 @  General:  Well-developed upper body, well-nourished and in no acute  distress Eyes:  anicteric.  Lungs: Clear to auscultation bilaterally. Heart:  S1S2, no rubs, murmurs, gallops. Abdomen:  soft, non-tender, no hepatosplenomegaly, hernia, or mass and BS+.  Rectal:   Anoderm inspection revealed small external hemorrhoids Anal wink was absent Digital exam revealed increased resting tone and absent voluntary squeeze. No mass or rectocele present. Simulated defecation with valsalva revealed appropriate abdominal contraction and paradoxical contraction and no descent    Lymph:  no cervical or supraclavicular adenopathy. Extremities:   Mm wasting LE's Skin   no rash. Neuro:  A&O x 3. Extremely weak LE's - unable  Psych:  appropriate mood and  Affect.   Data Reviewed: See HPI

## 2024-07-15 ENCOUNTER — Telehealth: Payer: Self-pay | Admitting: Internal Medicine

## 2024-07-15 NOTE — Telephone Encounter (Signed)
 Inbound call from [place] from the [facility] stating they are needing office notes from 07/07/24 faxed over to them  Fax number [phone] Please advise  Thank you

## 2024-07-15 NOTE — Telephone Encounter (Signed)
 Office notes from 07/07/2024 and the x-ray report and results faxed to the [facility].

## 2024-08-20 ENCOUNTER — Ambulatory Visit: Admitting: Internal Medicine
